# Patient Record
Sex: Female | Born: 1935
Health system: Southern US, Community
[De-identification: ages and names within clinical notes are randomized; demographics above are authoritative.]

## PROBLEM LIST (undated history)

## (undated) DIAGNOSIS — I779 Disorder of arteries and arterioles, unspecified: Secondary | ICD-10-CM

## (undated) DIAGNOSIS — T7840XA Allergy, unspecified, initial encounter: Secondary | ICD-10-CM

## (undated) DIAGNOSIS — N183 Chronic kidney disease, stage 3 unspecified: Secondary | ICD-10-CM

## (undated) DIAGNOSIS — I5189 Other ill-defined heart diseases: Secondary | ICD-10-CM

## (undated) DIAGNOSIS — M1990 Unspecified osteoarthritis, unspecified site: Secondary | ICD-10-CM

## (undated) DIAGNOSIS — E785 Hyperlipidemia, unspecified: Secondary | ICD-10-CM

## (undated) DIAGNOSIS — G4700 Insomnia, unspecified: Secondary | ICD-10-CM

## (undated) DIAGNOSIS — K219 Gastro-esophageal reflux disease without esophagitis: Secondary | ICD-10-CM

## (undated) DIAGNOSIS — I639 Cerebral infarction, unspecified: Secondary | ICD-10-CM

## (undated) DIAGNOSIS — I10 Essential (primary) hypertension: Secondary | ICD-10-CM

## (undated) DIAGNOSIS — I739 Peripheral vascular disease, unspecified: Secondary | ICD-10-CM

## (undated) HISTORY — DX: Insomnia, unspecified: G47.00

## (undated) HISTORY — DX: Cerebral infarction, unspecified: I63.9

## (undated) HISTORY — PX: HERNIA REPAIR: SHX51

## (undated) HISTORY — DX: Chronic kidney disease, stage 3 unspecified: N18.30

## (undated) HISTORY — PX: CHOLECYSTECTOMY: SHX55

## (undated) HISTORY — DX: Unspecified osteoarthritis, unspecified site: M19.90

## (undated) HISTORY — DX: Hyperlipidemia, unspecified: E78.5

## (undated) HISTORY — PX: BACK SURGERY: SHX140

## (undated) HISTORY — PX: CAROTID ENDARTERECTOMY: SUR193

## (undated) HISTORY — DX: Essential (primary) hypertension: I10

## (undated) HISTORY — PX: TONSILLECTOMY: SUR1361

## (undated) HISTORY — DX: Allergy, unspecified, initial encounter: T78.40XA

## (undated) HISTORY — DX: Gastro-esophageal reflux disease without esophagitis: K21.9

## (undated) HISTORY — PX: APPENDECTOMY: SHX54

## (undated) HISTORY — PX: HIP SURGERY: SHX245

## (undated) HISTORY — PX: FOOT SURGERY: SHX648

## (undated) HISTORY — PX: EYE SURGERY: SHX253

## (undated) HISTORY — DX: Chronic kidney disease, stage 3 (moderate): N18.3

---

## 2007-03-19 ENCOUNTER — Other Ambulatory Visit: Payer: Self-pay

## 2007-03-20 ENCOUNTER — Inpatient Hospital Stay: Payer: Self-pay | Admitting: Internal Medicine

## 2007-03-26 ENCOUNTER — Other Ambulatory Visit: Payer: Self-pay

## 2007-03-26 ENCOUNTER — Inpatient Hospital Stay: Payer: Self-pay | Admitting: Internal Medicine

## 2007-06-03 ENCOUNTER — Ambulatory Visit: Payer: Self-pay | Admitting: Gastroenterology

## 2007-12-20 ENCOUNTER — Observation Stay: Payer: Self-pay | Admitting: Internal Medicine

## 2007-12-20 ENCOUNTER — Other Ambulatory Visit: Payer: Self-pay

## 2008-05-17 ENCOUNTER — Inpatient Hospital Stay: Payer: Self-pay | Admitting: Vascular Surgery

## 2008-05-17 ENCOUNTER — Other Ambulatory Visit: Payer: Self-pay

## 2010-01-12 ENCOUNTER — Ambulatory Visit: Payer: Self-pay | Admitting: Internal Medicine

## 2010-09-12 ENCOUNTER — Ambulatory Visit: Payer: Self-pay | Admitting: Internal Medicine

## 2013-05-20 ENCOUNTER — Inpatient Hospital Stay: Payer: Self-pay | Admitting: Internal Medicine

## 2013-05-20 LAB — CBC
HCT: 30.6 % — ABNORMAL LOW (ref 35.0–47.0)
HGB: 11 g/dL — ABNORMAL LOW (ref 12.0–16.0)
MCH: 32.1 pg (ref 26.0–34.0)
MCHC: 35.9 g/dL (ref 32.0–36.0)
MCV: 89 fL (ref 80–100)
WBC: 9.8 10*3/uL (ref 3.6–11.0)

## 2013-05-20 LAB — COMPREHENSIVE METABOLIC PANEL
Albumin: 4.1 g/dL (ref 3.4–5.0)
Alkaline Phosphatase: 97 U/L (ref 50–136)
Co2: 26 mmol/L (ref 21–32)
EGFR (Non-African Amer.): 39 — ABNORMAL LOW
Glucose: 127 mg/dL — ABNORMAL HIGH (ref 65–99)
Osmolality: 245 (ref 275–301)
Potassium: 5 mmol/L (ref 3.5–5.1)
Sodium: 119 mmol/L — CL (ref 136–145)
Total Protein: 7.5 g/dL (ref 6.4–8.2)

## 2013-05-20 LAB — URINALYSIS, COMPLETE
Bacteria: NONE SEEN
Bilirubin,UR: NEGATIVE
Blood: NEGATIVE
Glucose,UR: NEGATIVE mg/dL (ref 0–75)
Ketone: NEGATIVE
Leukocyte Esterase: NEGATIVE
Nitrite: NEGATIVE
RBC,UR: 1 /HPF (ref 0–5)
Specific Gravity: 1.004 (ref 1.003–1.030)
Squamous Epithelial: <1
WBC UR: 1 /HPF (ref 0–5)

## 2013-05-21 LAB — BASIC METABOLIC PANEL
Anion Gap: 6 — ABNORMAL LOW (ref 7–16)
Calcium, Total: 8.6 mg/dL (ref 8.5–10.1)
Chloride: 99 mmol/L (ref 98–107)
Creatinine: 1.13 mg/dL (ref 0.60–1.30)
EGFR (African American): 52 — ABNORMAL LOW
Glucose: 91 mg/dL (ref 65–99)
Osmolality: 260 (ref 275–301)
Potassium: 4.3 mmol/L (ref 3.5–5.1)
Sodium: 128 mmol/L — ABNORMAL LOW (ref 136–145)

## 2013-05-21 LAB — TSH: Thyroid Stimulating Horm: 3.66 u[IU]/mL

## 2013-05-22 LAB — BASIC METABOLIC PANEL
Anion Gap: 6 — ABNORMAL LOW (ref 7–16)
Co2: 22 mmol/L (ref 21–32)
Creatinine: 1.19 mg/dL (ref 0.60–1.30)
EGFR (Non-African Amer.): 42 — ABNORMAL LOW
Osmolality: 271 (ref 275–301)
Potassium: 4.1 mmol/L (ref 3.5–5.1)
Sodium: 134 mmol/L — ABNORMAL LOW (ref 136–145)

## 2013-05-23 LAB — BASIC METABOLIC PANEL
BUN: 17 mg/dL (ref 7–18)
Co2: 23 mmol/L (ref 21–32)
Creatinine: 1.03 mg/dL (ref 0.60–1.30)
Osmolality: 275 (ref 275–301)
Potassium: 3.8 mmol/L (ref 3.5–5.1)
Sodium: 137 mmol/L (ref 136–145)

## 2013-06-19 LAB — COMPREHENSIVE METABOLIC PANEL
Albumin: 3.3 g/dL — ABNORMAL LOW (ref 3.4–5.0)
Anion Gap: 6 — ABNORMAL LOW (ref 7–16)
Chloride: 103 mmol/L (ref 98–107)
Co2: 25 mmol/L (ref 21–32)
Creatinine: 1.71 mg/dL — ABNORMAL HIGH (ref 0.60–1.30)
EGFR (African American): 31 — ABNORMAL LOW
Glucose: 124 mg/dL — ABNORMAL HIGH (ref 65–99)
Osmolality: 280 (ref 275–301)
Potassium: 4.3 mmol/L (ref 3.5–5.1)
Total Protein: 6.1 g/dL — ABNORMAL LOW (ref 6.4–8.2)

## 2013-06-19 LAB — URINALYSIS, COMPLETE
Bilirubin,UR: NEGATIVE
Glucose,UR: NEGATIVE mg/dL (ref 0–75)
Ketone: NEGATIVE
Ph: 6 (ref 4.5–8.0)
Protein: 30
Specific Gravity: 1.013 (ref 1.003–1.030)
WBC UR: 70 /HPF (ref 0–5)

## 2013-06-19 LAB — TROPONIN I: Troponin-I: <0.02 ng/mL

## 2013-06-19 LAB — APTT: Activated PTT: 39.4 secs — ABNORMAL HIGH (ref 23.6–35.9)

## 2013-06-19 LAB — CBC
HCT: 28 % — ABNORMAL LOW (ref 35.0–47.0)
HGB: 10 g/dL — ABNORMAL LOW (ref 12.0–16.0)
MCH: 32.8 pg (ref 26.0–34.0)
MCHC: 35.8 g/dL (ref 32.0–36.0)
WBC: 10.3 10*3/uL (ref 3.6–11.0)

## 2013-06-19 LAB — PROTIME-INR: Prothrombin Time: 13.5 secs (ref 11.5–14.7)

## 2013-06-19 LAB — CALCIUM: Calcium, Total: 8.7 mg/dL (ref 8.5–10.1)

## 2013-06-20 ENCOUNTER — Inpatient Hospital Stay: Payer: Self-pay | Admitting: Internal Medicine

## 2013-06-20 LAB — BASIC METABOLIC PANEL
Anion Gap: 10 (ref 7–16)
Co2: 23 mmol/L (ref 21–32)
Creatinine: 1.66 mg/dL — ABNORMAL HIGH (ref 0.60–1.30)
EGFR (African American): 32 — ABNORMAL LOW
Glucose: 157 mg/dL — ABNORMAL HIGH (ref 65–99)
Osmolality: 287 (ref 275–301)
Potassium: 4.5 mmol/L (ref 3.5–5.1)
Sodium: 137 mmol/L (ref 136–145)

## 2013-06-20 LAB — TROPONIN I
Troponin-I: <0.02 ng/mL
Troponin-I: <0.02 ng/mL

## 2013-06-20 LAB — CK TOTAL AND CKMB (NOT AT ARMC)
CK, Total: 31 U/L (ref 21–215)
CK, Total: 41 U/L (ref 21–215)
CK-MB: <0.5 ng/mL — ABNORMAL LOW (ref 0.5–3.6)
CK-MB: 1 ng/mL (ref 0.5–3.6)

## 2013-06-21 LAB — CBC WITH DIFFERENTIAL/PLATELET
Basophil #: 0.1 10*3/uL (ref 0.0–0.1)
Basophil %: 0.9 %
Eosinophil %: 3.2 %
HGB: 11.2 g/dL — ABNORMAL LOW (ref 12.0–16.0)
Lymphocyte %: 50.6 %
MCH: 32.8 pg (ref 26.0–34.0)
MCHC: 34.9 g/dL (ref 32.0–36.0)
Monocyte #: 0.5 x10 3/mm (ref 0.2–0.9)
Monocyte %: 5.6 %
Neutrophil #: 3.5 10*3/uL (ref 1.4–6.5)
Platelet: 235 10*3/uL (ref 150–440)
RBC: 3.41 10*6/uL — ABNORMAL LOW (ref 3.80–5.20)
WBC: 8.7 10*3/uL (ref 3.6–11.0)

## 2013-06-21 LAB — BASIC METABOLIC PANEL
BUN: 33 mg/dL — ABNORMAL HIGH (ref 7–18)
Calcium, Total: 8.8 mg/dL (ref 8.5–10.1)
Creatinine: 1.26 mg/dL (ref 0.60–1.30)
EGFR (African American): 45 — ABNORMAL LOW
Glucose: 94 mg/dL (ref 65–99)
Osmolality: 281 (ref 275–301)
Potassium: 4.5 mmol/L (ref 3.5–5.1)
Sodium: 137 mmol/L (ref 136–145)

## 2013-06-21 LAB — URINE CULTURE

## 2013-10-28 ENCOUNTER — Ambulatory Visit: Payer: Self-pay | Admitting: Internal Medicine

## 2014-04-05 ENCOUNTER — Inpatient Hospital Stay: Payer: Self-pay | Admitting: Internal Medicine

## 2014-04-05 LAB — CBC WITH DIFFERENTIAL/PLATELET
Basophil #: 0.1 10*3/uL (ref 0.0–0.1)
Basophil %: 0.6 %
EOS PCT: 1.5 %
Eosinophil #: 0.2 10*3/uL (ref 0.0–0.7)
HCT: 35 % (ref 35.0–47.0)
HGB: 11.5 g/dL — ABNORMAL LOW (ref 12.0–16.0)
LYMPHS ABS: 1.4 10*3/uL (ref 1.0–3.6)
LYMPHS PCT: 13.1 %
MCH: 33.2 pg (ref 26.0–34.0)
MCHC: 32.9 g/dL (ref 32.0–36.0)
MCV: 101 fL — AB (ref 80–100)
Monocyte #: 0.5 x10 3/mm (ref 0.2–0.9)
Monocyte %: 5 %
Neutrophil #: 8.6 10*3/uL — ABNORMAL HIGH (ref 1.4–6.5)
Neutrophil %: 79.8 %
Platelet: 229 10*3/uL (ref 150–440)
RBC: 3.47 10*6/uL — ABNORMAL LOW (ref 3.80–5.20)
RDW: 14.4 % (ref 11.5–14.5)
WBC: 10.7 10*3/uL (ref 3.6–11.0)

## 2014-04-05 LAB — HEPATIC FUNCTION PANEL A (ARMC)
ALT: 23 U/L
AST: 31 U/L (ref 15–37)
Albumin: 3.5 g/dL (ref 3.4–5.0)
Alkaline Phosphatase: 120 U/L — ABNORMAL HIGH
Bilirubin,Total: 0.3 mg/dL (ref 0.2–1.0)
Total Protein: 7.3 g/dL (ref 6.4–8.2)

## 2014-04-05 LAB — URINALYSIS, COMPLETE
BACTERIA: NONE SEEN
Bilirubin,UR: NEGATIVE
Glucose,UR: NEGATIVE mg/dL (ref 0–75)
Ketone: NEGATIVE
Nitrite: POSITIVE
Ph: 5 (ref 4.5–8.0)
RBC,UR: <1 /HPF (ref 0–5)
Specific Gravity: 1.011 (ref 1.003–1.030)
WBC UR: 10 /HPF (ref 0–5)

## 2014-04-05 LAB — BASIC METABOLIC PANEL
Anion Gap: 10 (ref 7–16)
BUN: 38 mg/dL — AB (ref 7–18)
CO2: 23 mmol/L (ref 21–32)
Calcium, Total: 9.1 mg/dL (ref 8.5–10.1)
Chloride: 108 mmol/L — ABNORMAL HIGH (ref 98–107)
Creatinine: 1.59 mg/dL — ABNORMAL HIGH (ref 0.60–1.30)
EGFR (African American): 34 — ABNORMAL LOW
EGFR (Non-African Amer.): 29 — ABNORMAL LOW
Glucose: 168 mg/dL — ABNORMAL HIGH (ref 65–99)
Osmolality: 294 (ref 275–301)
POTASSIUM: 4.4 mmol/L (ref 3.5–5.1)
Sodium: 141 mmol/L (ref 136–145)

## 2014-04-05 LAB — TROPONIN I: Troponin-I: <0.02 ng/mL

## 2014-04-05 LAB — MAGNESIUM: Magnesium: 2 mg/dL

## 2014-04-06 LAB — CBC WITH DIFFERENTIAL/PLATELET
Basophil #: 0 10*3/uL (ref 0.0–0.1)
Basophil %: 0.3 %
EOS ABS: 0.1 10*3/uL (ref 0.0–0.7)
EOS PCT: 0.6 %
HCT: 30 % — AB (ref 35.0–47.0)
HGB: 9.7 g/dL — AB (ref 12.0–16.0)
Lymphocyte #: 3.3 10*3/uL (ref 1.0–3.6)
Lymphocyte %: 25.4 %
MCH: 32.4 pg (ref 26.0–34.0)
MCHC: 32.3 g/dL (ref 32.0–36.0)
MCV: 100 fL (ref 80–100)
Monocyte #: 0.8 x10 3/mm (ref 0.2–0.9)
Monocyte %: 6.1 %
NEUTROS ABS: 8.7 10*3/uL — AB (ref 1.4–6.5)
Neutrophil %: 67.6 %
Platelet: 219 10*3/uL (ref 150–440)
RBC: 2.99 10*6/uL — AB (ref 3.80–5.20)
RDW: 14.4 % (ref 11.5–14.5)
WBC: 12.8 10*3/uL — AB (ref 3.6–11.0)

## 2014-04-06 LAB — BASIC METABOLIC PANEL
Anion Gap: 10 (ref 7–16)
BUN: 32 mg/dL — AB (ref 7–18)
CO2: 24 mmol/L (ref 21–32)
CREATININE: 1.42 mg/dL — AB (ref 0.60–1.30)
Calcium, Total: 8.3 mg/dL — ABNORMAL LOW (ref 8.5–10.1)
Chloride: 108 mmol/L — ABNORMAL HIGH (ref 98–107)
EGFR (African American): 39 — ABNORMAL LOW
EGFR (Non-African Amer.): 34 — ABNORMAL LOW
Glucose: 94 mg/dL (ref 65–99)
Osmolality: 290 (ref 275–301)
POTASSIUM: 4.2 mmol/L (ref 3.5–5.1)
SODIUM: 142 mmol/L (ref 136–145)

## 2014-04-06 LAB — LIPID PANEL
CHOLESTEROL: 164 mg/dL (ref 0–200)
HDL: 40 mg/dL (ref 40–60)
Ldl Cholesterol, Calc: 102 mg/dL — ABNORMAL HIGH (ref 0–100)
TRIGLYCERIDES: 108 mg/dL (ref 0–200)
VLDL Cholesterol, Calc: 22 mg/dL (ref 5–40)

## 2014-04-06 LAB — HEMOGLOBIN A1C: Hemoglobin A1C: 6.7 % — ABNORMAL HIGH (ref 4.2–6.3)

## 2014-04-07 LAB — BASIC METABOLIC PANEL
ANION GAP: 9 (ref 7–16)
BUN: 26 mg/dL — AB (ref 7–18)
Calcium, Total: 8.8 mg/dL (ref 8.5–10.1)
Chloride: 107 mmol/L (ref 98–107)
Co2: 26 mmol/L (ref 21–32)
Creatinine: 1.43 mg/dL — ABNORMAL HIGH (ref 0.60–1.30)
EGFR (African American): 39 — ABNORMAL LOW
EGFR (Non-African Amer.): 33 — ABNORMAL LOW
Glucose: 87 mg/dL (ref 65–99)
Osmolality: 287 (ref 275–301)
Potassium: 4 mmol/L (ref 3.5–5.1)
Sodium: 142 mmol/L (ref 136–145)

## 2014-04-07 LAB — CBC WITH DIFFERENTIAL/PLATELET
BASOS ABS: 0.1 10*3/uL (ref 0.0–0.1)
Basophil %: 0.5 %
Eosinophil #: 0.3 10*3/uL (ref 0.0–0.7)
Eosinophil %: 2.8 %
HCT: 30.7 % — ABNORMAL LOW (ref 35.0–47.0)
HGB: 10 g/dL — ABNORMAL LOW (ref 12.0–16.0)
Lymphocyte #: 4.4 10*3/uL — ABNORMAL HIGH (ref 1.0–3.6)
Lymphocyte %: 37.5 %
MCH: 32.5 pg (ref 26.0–34.0)
MCHC: 32.5 g/dL (ref 32.0–36.0)
MCV: 100 fL (ref 80–100)
Monocyte #: 0.7 x10 3/mm (ref 0.2–0.9)
Monocyte %: 5.9 %
NEUTROS PCT: 53.3 %
Neutrophil #: 6.2 10*3/uL (ref 1.4–6.5)
Platelet: 214 10*3/uL (ref 150–440)
RBC: 3.07 10*6/uL — AB (ref 3.80–5.20)
RDW: 14.6 % — AB (ref 11.5–14.5)
WBC: 11.6 10*3/uL — AB (ref 3.6–11.0)

## 2014-10-13 DIAGNOSIS — I10 Essential (primary) hypertension: Secondary | ICD-10-CM | POA: Diagnosis not present

## 2014-10-13 DIAGNOSIS — M79673 Pain in unspecified foot: Secondary | ICD-10-CM | POA: Diagnosis not present

## 2014-10-13 DIAGNOSIS — I679 Cerebrovascular disease, unspecified: Secondary | ICD-10-CM | POA: Diagnosis not present

## 2014-10-13 DIAGNOSIS — N183 Chronic kidney disease, stage 3 (moderate): Secondary | ICD-10-CM | POA: Diagnosis not present

## 2014-10-13 DIAGNOSIS — E782 Mixed hyperlipidemia: Secondary | ICD-10-CM | POA: Diagnosis not present

## 2014-10-13 DIAGNOSIS — J0191 Acute recurrent sinusitis, unspecified: Secondary | ICD-10-CM | POA: Diagnosis not present

## 2014-11-03 DIAGNOSIS — I10 Essential (primary) hypertension: Secondary | ICD-10-CM | POA: Diagnosis not present

## 2014-11-03 DIAGNOSIS — I2510 Atherosclerotic heart disease of native coronary artery without angina pectoris: Secondary | ICD-10-CM | POA: Diagnosis not present

## 2014-11-03 DIAGNOSIS — E785 Hyperlipidemia, unspecified: Secondary | ICD-10-CM | POA: Diagnosis not present

## 2014-11-03 DIAGNOSIS — I6529 Occlusion and stenosis of unspecified carotid artery: Secondary | ICD-10-CM | POA: Diagnosis not present

## 2014-12-04 NOTE — H&P (Signed)
PATIENT NAME:  [name], HAWKIN MR#:  [id number] DATE OF BIRTH:  Jul 27, 1927  DATE OF ADMISSION:  05/20/2013  PRIMARY CARE PHYSICIAN: Lavera Guise, MD  REFERRING EMERGENCY ROOM PHYSICIAN: Algis Liming. Jimmye Norman, MD  CHIEF COMPLAINT: Weakness and dizziness.   HISTORY OF PRESENTING ILLNESS: An 81 year old female who is living alone, walks with a walker and almost independent in her day-to-day activities. Is taking Lasix for her leg edema and blood pressure. For the last 1 to 2 weeks, she started feeling a little weak, and last week, she started getting more dizzy, so her daughter, who lives nearby, was visiting her more frequently, and she also noticed, over the weekend, the last 2 to 3 days, that the patient is sometimes confused, and she had slurred speech and she is disoriented. So, she got worried about her, and finally they decided to call her primary care physician's office today. They called the office and described the symptoms and advised to come to the Emergency Room for these symptoms. On further questioning, the patient denies any new changes in the medications. She says that she is compliant with the medication. Denies any fever, focal weakness, any tremors or seizurelike activity. Denies any syncopal episodes, palpitations. In the ER, her sodium was found 119, and so hospitalist service is being contacted for further management of hyponatremia.   REVIEW OF SYSTEMS:  CONSTITUTIONAL: Negative for fever. Positive for fatigue and generalized weakness, but no pain or weight loss.  EYES: No blurring, double vision or discharge.  EARS, NOSE, THROAT: No tinnitus, ear pain or hearing loss.  RESPIRATORY: No cough, wheezing, hemoptysis or shortness of breath.  CARDIOVASCULAR: No chest pain, orthopnea, edema or palpitations.  GASTROINTESTINAL: No nausea, vomiting, diarrhea or abdominal pain.  GENITOURINARY: No dysuria, hematuria or increased frequency of the urination.  ENDOCRINE: No increased  sweating. No heat or cold intolerance.  SKIN: No acne, rashes or any lesions on the skin.  MUSCULOSKELETAL: No pain or swelling in the joints except her left lower limb, which is chronically swollen since she had some surgeries in the past and injuries many years ago.  NEUROLOGICAL: Denies any numbness, weakness, tremors or vertigo, but she has been feeling dizzy and had some speech problem in the last few days.  PSYCHIATRIC: No anxiety, insomnia or bipolar disorder.   PAST MEDICAL HISTORY:  1. Recurrent hyponatremia in the past.  2. Syncopal episode.  3. Hypertension.  4. Gastroesophageal reflux disease.  5. Insomnia.  6. Arthritis.   PAST SURGICAL HISTORY:  1. Hip replacement 2 times on the left.  2. Back surgery x3.  3. Left foot surgery with placement of bone.  4. Right foot surgery with placement of wires.  5. Colonoscopy. 6. Cholecystectomy.  7. Appendectomy.  8. Hernia surgery.  9. Tonsillectomy.   SOCIAL HISTORY: She lives alone. Walks with a walker. Denies smoking, alcohol or illegal drug use. She has a daughter who lives in [place] and takes care of her grocery and day-to-day needs.   FAMILY HISTORY: Positive for heart disease.   HOME MEDICATION:  1. Amlodipine/benazepril 10/20 mg capsule once a day.  2. An 81 mg aspirin once a day.  3. Centrum vitamin 1 tablet once a day.  4. Clonidine 0.2 mg 24-hour transdermal patch once a week.  5. Furosemide 20 mg oral tablet once a day.  6. Hydrocortisone, neomycin and polymyxin B combination solution 2 drops to left ear 3 times a day.  7. Omeprazole 20 mg delayed-release capsule once a  day for belching and stomach.  8. Uloric 40 mg oral tablet once a day.  9. Vitamin D3 1000 international units once a day.  10. Zolpidem 5 mg oral tablet once a day.   PHYSICAL EXAMINATION:  VITAL SIGNS: In the ER, temperature 97.6, pulse rate 73, respirations 18, blood pressure 150/66 and pulse oximetry 97 on room air.  GENERAL: The patient  is fully alert and oriented to time, place and person. She is appropriate for her age.  HEENT: Head and neck atraumatic. Conjunctivae pink. Oral mucosa moist.  NECK: Supple. No JVD.  RESPIRATORY: Bilateral clear and equal air entry.  CARDIOVASCULAR: S1, S2 present, regular. No murmur.  ABDOMEN: Soft, nontender. Bowel sounds present. No organomegaly.  SKIN: No rashes.  LEGS: No edema, except her chronic swelling on the left leg which is due to surgeries many years ago, and the leg is always swollen like that.  JOINTS: No swelling or tenderness.  NEUROLOGICAL: Power 4 out of 5. Generalized weakness. Moves all 4 limbs. No tremors or rigidity.  PSYCHIATRIC: Does not appear in any acute psychiatric illness.   IMPORTANT LABORATORY RESULTS: Glucose 127, BUN 22, creatinine 1.27, sodium 119, potassium 5.0, chloride 88, CO2 26, protein 7.5, albumin 4.1, bilirubin 0.6, SGOT 29, SGPT 22. WBC 9.8, hemoglobin 11, platelet count 325, MCV 89. Urinalysis is negative.   ASSESSMENT AND PLAN: An 81 year old female who is very independent in day-to-day activities, taking Lasix every day for her chronic leg swelling and hypertension, was feeling dizzy and speech problems for the last few days and came to the Emergency Room. Found having hyponatremia of 119.  1. Hyponatremia. Sodium is 119. Will give her IV fluid, normal saline, and monitor her sodium level. Will hold her Lasix for now. Will also check TSH level.  2. Hypertension. Will continue home medication, benazepril and amlodipine, for now and hold Lasix. Blood pressure is under control.  3. Weakness and dizziness. This is due to hyponatremia, but will get physical therapy evaluation because of her old age.  4. Anemia. Will give iron supplementation orally.  5. Gastroesophageal reflux disease. Will give omeprazole.  6. Insomnia. Will continue zolpidem which she was taking at home.   CODE STATUS: Do not resuscitate. Healthcare power of attorney is her daughter,  who is present in the room.   TOTAL TIME SPENT ON THIS ADMISSION: 50 minutes.   ____________________________ Ceasar Lund Anselm Jungling, MD vgv:OSi D: 05/20/2013 12:26:17 ET T: 05/20/2013 12:36:30 ET JOB#: [id number]  cc: Ceasar Lund. Anselm Jungling, MD, <Dictator> Lavera Guise, MD Rosalio Macadamia [facility] MD ELECTRONICALLY SIGNED 05/26/2013 23:05

## 2014-12-04 NOTE — Consult Note (Signed)
Brief Consult Note: Diagnosis: Pt with history of hyeprtension who was noted to become unresponsive while eating.   Patient was seen by consultant.   Recommend further assessment or treatment.   Comments: 81 yo female who became unresponsive while eating. She has been on a variety of antihypertensives including clonidine and beta blockers. She was noted to be hypotensive and bradycardic on arrival to eer and by ems. Pts family states they were unable to feel a pulse but said she had shallow breathing. They were instructed to do cpr by 911. Pt currently is hemodynamically stable. Herat rate in the upper 50s to 60's. Beta blockers and clonidine held. Will follwo rhythm and symtpoms .  Electronic Signatures: Teodoro Spray (MD)  (Signed [phone] 07:11)  Authored: Brief Consult Note   Last Updated: 08-Nov-14 07:11 by Teodoro Spray (MD)

## 2014-12-04 NOTE — H&P (Signed)
PATIENT NAME:  [name], [name] MR#:  [id number] DATE OF BIRTH:  1927/07/10  DATE OF ADMISSION:  06/20/2013  REFERRING PHYSICIAN: Dr. Hinda Kehr.   PRIMARY CARE PHYSICIAN: Dr. Clayborn Bigness.   CARDIOLOGY: Currently seeing none.   CHIEF COMPLAINT: Syncope.   HISTORY OF PRESENT ILLNESS: This is an 81 year old female who presents with syncope. The patient was sitting at home on a chair. Family were at bedside, her daughter and her son-in-law. The patient reports she felt some lightheadedness and dizzy and felt foggy. Then, she had episode of loss of consciousness. Family reports she always was breathing, but they reported that when they called 911, they were instructed to do some chest compressions. They did some chest compressions for her. When paramedics presented, they found her to be bradycardic. Upon presentation to ED, the patient's symptoms have resolved and she was back to her baseline. In the ED, the patient was noticed to have bradycardia with junctional rhythm at 49 beats per minute. The family reports the patient was recently started on a new medication for her blood pressure. Report she has a few changes. They report she was on diuretic which was stopped before 1-1/2 weeks. She was on Norvasc and benazepril which was stopped then, and the patient was transitioned to a few medications, where she was started on hydralazine before 3 weeks. A couple of days ago, she was resumed back on benazepril and yesterday, she was started on Coreg 6.25 mg. She took 2 doses, one in the morning and one in the evening yesterday prior to her syncopal episode. As well, they report she has been on clonidine patch 0.2 mg which was stopped yesterday as well. As well, the patient had blood work done which did show her to be in acute renal failure with baseline creatinine of 1, where it was 1.71 today. As well, she had a positive urinalysis. Currently, the patient is back to her baseline. Denies any shortness of breath,  any confusion, any altered mental status, any nausea, vomiting, diarrhea, fever, constipation. Hospitalist service was requested to admit the patient for further management and treatment for her syncope and bradycardia and UTI.    PAST MEDICAL HISTORY:  1. Recurrent hyponatremia in the past.  2. Syncopal episodes.  3. Hypertension.  4. Gastroesophageal reflux disease.  5. Insomnia.  6. Arthritis.   PAST SURGICAL HISTORY:  1. Hip replacement x 2 to the left.  2. Back surgery x 3.  3. Left foot surgery with placement of bone.  4. Right foot surgery with placement of wires.  5. Colonoscopy.  6. Cholecystectomy.  7. Appendectomy.  8. Hernia surgery.  9. Tonsillectomy.   SOCIAL HISTORY: She lives alone. Walks with a walker. Denies smoking, alcohol or illegal use. She has a daughter who lives in [place] and takes care of her groceries and day-to-day needs.   FAMILY HISTORY: Positive for heart disease.   REVIEW OF SYSTEMS:  CONSTITUTIONAL: The patient denies any fever, chills, weakness or fatigue.  EYES: Denies blurry vision, double vision, inflammation, glaucoma, tinnitus, ear pain, epistaxis or discharge.  RESPIRATORY: Denies cough, wheezing, hemoptysis, dyspnea, COPD.   CARDIOVASCULAR: Denies chest pain, edema, arrhythmia, palpitation. Reports episode of syncope.  GASTROINTESTINAL: Denies nausea, vomiting, diarrhea, abdominal pain, hematemesis, melena.  GENITOURINARY: Denies dysuria, hematuria or renal colic.  ENDOCRINE: Denies polyuria, polydipsia, heat or cold intolerance.  HEMATOLOGY: Denies anemia, easy bruising, bleeding diathesis.  INTEGUMENT: Denies acne, rash or skin lesions.  MUSCULOSKELETAL: Denies any neck pain, shoulder pain,  cramps, swelling or gout.  NEUROLOGIC: Denies CVA, TIA, headache, ataxia, vertigo, tremors. Reports some dizziness and feeling foggy prior to the syncope.  PSYCHIATRIC: Denies anxiety, insomnia, depression.   PHYSICAL EXAMINATION:  VITAL SIGNS:  Temperature 97.7, pulse 47, respiratory rate 18, blood pressure 145/72, saturating 98% on room air. Currently, her heart rate is 62 on telemetry monitor.  GENERAL: Elderly female, looks comfortable in bed, in no apparent distress.  HEENT: Head atraumatic, normocephalic. Pupils equal, reactive to light. Pink conjunctivae. Anicteric sclerae. Moist oral mucosa.  NECK: Supple. No thyromegaly. No JVD.  CHEST: Good air entry bilaterally. No wheezing, rales, rhonchi.  CARDIOVASCULAR: S1, S2 heard. No rubs, murmurs or gallops. Regular rate and rhythm.  ABDOMEN: Soft, nontender, nondistended. Bowel sounds present.  EXTREMITIES: No edema. No clubbing. No cyanosis. Pedal pulses felt bilaterally.  PSYCHIATRIC: Appropriate affect. Awake, alert x 3. Intact judgment and insight.  NEUROLOGIC: Cranial nerves grossly intact. Motor 5 out of 5. No focal deficits.  LYMPHATICS: No cervical or supraclavicular lymphadenopathy.  SKIN: Warm and dry. No rash.   PERTINENT LABORATORIES: Glucose 124, BUN 42, creatinine 1.71, sodium 134, potassium 4.3, chloride 103, CO2 25. ALT 18, AST 22, alk phos 84. Troponin less than 0.02. White blood cells 10.3, hemoglobin 10, hematocrit 28, platelets 238. Urinalysis showing +3 leukocyte esterase and 70 white blood cells.   EKG showing junctional rhythm at 49 beats per minute.   Magnesium 1.7.   IMAGING: Chest x-ray showing no radiographic evidence of acute cardiopulmonary disease and atherosclerosis.   ASSESSMENT AND PLAN:  1. Syncope: Appears to be multifactorial, but this is mainly due to bradycardia, most likely from being started on Coreg. As well, dehydration and urinary tract infection contributing to it. Hypomagnesemia was replaced. The patient will be admitted to telemetry unit. Will check orthostatics on the patient. Will continue with hydration. Will be monitored on telemetry.  2. Bradycardia: This is most likely due to starting again on beta blockers. Currently, heart rate  much improved. Last heart rate was in the 70s on the telemetry monitor. The patient will be admitted to telemetry unit. Will be kept on the monitor and will have transcutaneous pads left and will have pacemaker at bedside. At this point, there is no indication for atropine or for glucagon to reverse the beta blockers as heart rate is good and the patient is asymptomatic. Will obtain a 2-D echocardiogram and will consult the cardiology service.  3. Urinary tract infection: the patient will be started on Rocephin.  4. Acute renal failure: Will hold benazepril. Will start her on intravenous fluids.  5. Hypertension: Will hold all medications currently and if needed, she will be started gradually on hydralazine.   6. Anemia: Continue with iron supplement.  7. Deep vein thrombosis prophylaxis: Subcutaneous heparin.  8. Gastrointestinal prophylaxis: On Protonix.   CODE STATUS: Discussed with the family. The patient has a Living Will. Her daughter is her healthcare power of attorney. The patient is a FULL CODE but does not want her life to be sustained if she requires life support for a long period of time.   TOTAL TIME SPENT ON ADMISSION AND PATIENT CARE: 60 minutes.   ____________________________ Albertine Patricia, MD dse:gb D: 06/20/2013 00:58:42 ET T: 06/20/2013 01:31:27 ET JOB#: [id number]  cc: Albertine Patricia, MD, <Dictator> Notnamed Croucher Graciela Husbands MD ELECTRONICALLY SIGNED 06/20/2013 4:09

## 2014-12-04 NOTE — Discharge Summary (Signed)
PATIENT NAME:  [name], [name] MR#:  [id number] DATE OF BIRTH:  October 02, 1926  DATE OF ADMISSION:  05/20/2013 DATE OF DISCHARGE:  05/23/2013  DISCHARGE DIAGNOSES:  1. Hyponatremia due to diuretic use.  2. Weakness due to hyponatremia.  3. Hypertension.  4. Anemia.  5. Insomnia.   CONDITION ON DISCHARGE: Stable.   CODE STATUS: DO NOT RESUSCITATE.   MEDICATIONS ON DISCHARGE:  1. Amlodipine/benazepril 10 mg/20 mg oral once a day.  2. Aspirin 81 mg once a day.  3. Omeprazole 20 mg once a day.  4. Zolpidem 5 mg once a day.  5. Vitamin D3 1000 international units once a day.  6. Uloric 40 mg oral tablet once a day.  7. Centrum vitamin once a day.  8. Ferrous sulfate 325 mg oral tablet 2 times a day with meals.   HOME HEALTH ON DISCHARGE: Yes. Advised to have physical therapy and nurse aid.   DIET: Low sodium, regular consistency.   ACTIVITY: As tolerated.   TIMEFRAME TO FOLLOWUP: Within 1 to 2 weeks advised to have sodium level. Follow up in 2 to 3 weeks with primary care physician, Dr. Clayborn Bigness.   HISTORY OF PRESENT ILLNESS: This 81 year old female lives alone. Walks with a walker. Almost independent in day-to-day activities. Was taking Lasix for her leg edema and blood pressure. For the last 1 month, she started feeling weak which was getting worse, and she was getting more dizzy. Her daughter also noticed that the patient was appearing sometimes confused and slurred speech and was disoriented, so she decided to call the primary physician's office. They called the office and described the symptoms, and they advised her to go to the Emergency Room. In the ER, she was found to have a sodium level of 119, so she was admitted for further management of hyponatremia.   HOSPITAL COURSE AND STAY:  1. She was started on IV fluid normal saline, and her sodium level was gradually improving which was corrected up to 137 at the time of discharge. We held her Lasix. TSH level was normal. Advised not  to take Lasix after discharge.  2. Hypertension: We continued benazepril/amlodipine, but we held Lasix. Blood pressure was under control without Lasix.  3. Weakness and dizziness: That was due to hyponatremia. Physical therapy suggested home with assist, and we arranged for that.  4. Anemia: Iron supplementation started orally.  5. Gastroesophageal reflux disease: Gave omeprazole.  6. Insomnia: We continued zolpidem as she was taking at home.   IMPORTANT LABORATORY RESULTS IN THE HOSPITAL: WBC count 9.8, hemoglobin 11, platelet count 325. Glucose 127, creatinine 1.27, sodium was 119 on presentation. Urinalysis was neg. TSH was 3.66. Sodium was gradually increasing up to 137 on the day of discharge.   TOTAL TIME SPENT ON THIS DISCHARGE SUMMARY: 35 minutes.   ____________________________ Ceasar Lund Anselm Jungling, MD vgv:gb D: 05/26/2013 22:13:23 ET T: 05/27/2013 00:30:28 ET JOB#: [id number]  cc: Ceasar Lund. Anselm Jungling, MD, <Dictator> Lavera Guise, MD Rosalio Macadamia [facility] MD ELECTRONICALLY SIGNED 06/10/2013 0:16

## 2014-12-04 NOTE — Discharge Summary (Signed)
PATIENT NAME:  [name], [name] MR#:  [id number] DATE OF BIRTH:  1927-01-18  DATE OF ADMISSION:  06/20/2013 DATE OF DISCHARGE:  06/22/2013  ADMITTING DIAGNOSIS: Syncope as well as possible cardiac arrest.   DISCHARGE DIAGNOSES: 1.  Syncope felt to be due to bradycardia as well as hypotension.  2.  Bradycardia felt to be due to recent initiation of Coreg. Her bradycardia has improved. The patient's Coreg has been stopped.  3.  Labile blood pressure. Initially blood pressure was low then it was elevated. I recommend outpatient renal artery stenosis evaluation. This needs to be arranged by primary care provider.  4.  Acute on chronic renal failure, resolved with IV hydration. Possibly urinary tract infection, although cultures were negative. The patient was asymptomatic. She was treated with antibiotics for 3 days in the hospital. This should suffice in light of no symptoms and urine cultures being negative.  5.  History of recurrent hyponatremia.  6.  History of recurrent syncopal episodes.  7.  Gastroesophageal reflux disease. 8.  Insomnia.  9.  Arthritis.  10.  Status post hip replacement x 2 on the left. 11.  Status post back surgery.  12.  Status post left foot surgery with replacement of bone.  13.  Status post right foot surgery with placement of wires. 14.  Status post cholecystectomy.  15.  Status post appendectomy.  16.  Status post hernia repair.  17.  Status post tonsillectomy.   CONSULTANTS: Dr. Ubaldo Glassing.  PERTINENT LABS AND EVALUATIONS: Admitting glucose 157, BUN 40, creatinine 1.66, sodium 137, potassium 4.5, chloride 104, CO2 23. Hemoglobin was 9.8. Troponin was less than 0.5. Magnesium 1.7. WBC 10.3, hemoglobin 10, platelet count 283.   Urine cultures: Mixed bacterial organisms, leukocytes 3+, WBC 70. Chest x-ray showed no acute abnormality. Echocardiogram of the heart showed normal EF, mildly increased left ventricular septal thickness, mild aortic regurgitation. Carotid  Dopplers showed quantification of carotid stenosis based on velocity and parameter that correlate the residual internal carotid diameter with NSCT-based stenosis levels. The left carotid artery shows a 70% diameter stenosis, widely patent right internal carotid artery. Creatinine was 1.26 on October 8th. Echocardiogram of the heart showed EF of 70% to 75%, borderline concentric LVH, mildly increased left ventricular septal thickness.   HOSPITAL COURSE: Please refer to H and P done by the admitting physician. The patient is an 81 year old Caucasian female who has had previous history of syncope in the past and has had very labile blood pressure who was seen by her primary care provider recently and had her blood pressure medications changed. She apparently had Coreg added to her current regimen. The patient at home was found to have some dizziness initially and then had loss of consciousness. There was some attempt at resuscitation by her son-in-law and EMS was called. When the patient arrived to the ER, she was noted to have junctional rhythm with a heart rate in the 50s. The patient was thought to have bradycardia related to Coreg therapy. She was seen by cardiology who recommended to hold Coreg. Initially, her blood pressure was low but then it started increasing, in the 190s. Not clear if this is related to some anxiety component as well as she had to be restarted on her benazepril and her clonidine which she reported that she was on this for years. Cardiology plans to do outpatient Holter monitor. I recommended to the patient  strongly to check her blood pressure and keep a log and follow up with her primary  care provider. She also needs to likely have renal artery evaluation to make sure that she does not have renal artery stenosis with significant labile blood pressure. From an acute hospital standpoint, the patient is currently stable, but as stated she needs very close monitoring.   DISCHARGE  MEDICATIONS:  1.  Bayer aspirin 81 mg 1 tab p.o. daily. 2.  Omeprazole 20 mg daily. 3.  Ambien 5 mg daily. 4.  Vitamin D3 1000 international units daily. 5.  Uloric 40 mg daily. 6.  Centrovite 1 tab p.o. daily. 7.  Iron sulfate 325 mg 1 tab p.o. b.i.d.  8.  Benazepril 5 mg 1 tab p.o. b.i.d. 9.  Catapres 0.1 mg 1 tab p.o. b.i.d.   DIET: Low sodium.   ACTIVITY: As tolerated.   DISCHARGE FOLLOWUP: With primary MD in 1 to 2 weeks. Dr. Ubaldo Glassing to arrange outpatient Holter monitor. The patient's family to keep a log of blood pressures to take to primary MD.   TIME SPENT ON DISCHARGE: 32 minutes. ____________________________ Lafonda Mosses Posey Pronto, MD shp:sb D: 06/23/2013 08:43:05 ET T: 06/23/2013 10:18:56 ET JOB#: [id number]  cc: Kinda Pottle H. Posey Pronto, MD, <Dictator> Alric Seton MD ELECTRONICALLY SIGNED 06/27/2013 16:49

## 2014-12-05 NOTE — Discharge Summary (Signed)
PATIENT NAME:  [name], [name] MR#:  [id number] DATE OF BIRTH:  July 25, 1927  DATE OF ADMISSION:  04/05/2014 DATE OF DISCHARGE:  04/07/2014  ADMISSION DIAGNOSES: 1. Cerebrovascular accident. 2.  Carotid artery stenosis.   DISCHARGE DIAGNOSES: 1.  Cerebrovascular accident.  2.  Left carotid artery stenosis 75%.  3.  Diabetes. CONSULTATIONS: Algernon Huxley, MD  PERTINENT LABORATORIES:  1.  CT of the head showed possible small acute frontal/temporal lobe CVA.  2. CTA of the neck showed 75% to 80% carotid stenosis in the left carotid artery.  3.  Low extremity Doppler was positive for Baker cyst.  4.  Discharge sodium 142, potassium 4.0, chloride 107, bicarbonate 26, BUN 26, creatinine 1.43, glucose is 87. White blood cells 11, hemoglobin 10, hematocrit 31, platelets are 214,000.  5.  LDL 182. Cholesterol 164, triglycerides 108, HDL 40, VLDL 22.  6.  Hemoglobin A1c is 6.7.   HOSPITAL COURSE: This is an 81 year old female with history of carotid artery disease, who presented with syncope and was found to have a head CT positive for an acute stroke. For further details, please refer to H and P.   1.  Acute frontal/temporal CVA without neurological findings. I think this is an incidental finding as the patient's initial symptoms on presentation did not correlate with a stroke. In any case, she was changed from aspirin to Plavix. She had carotid Dopplers which showed a 70% stenosis of the left carotid, so she underwent a CT angiogram which showed about 75% to 80% stenosis. Vascular, Dr. Lucky Cowboy, had seen the patient in consultation. This is a patient of Dr. Bunnie Domino who follows as an outpatient every 6 months. At this time, since the patient's carotid artery stenosis is actually asymptomatic, he recommends outpatient followup. She will continue on Plavix and statin, which she was started on during this hospitalization.  2.  Carotid artery stenosis. Plan as outlined below. The patient will follow up with Dr. Lucky Cowboy  in 1-2 weeks.  3.  Leg pain. The patient had some leg pain, underwent a Doppler which was negative for DVT but did show a Baker cyst. At this time, no surgical intervention. She could also have some peripheral arterial disease and if indicated may have ABIs as an outpatient with Dr. Lucky Cowboy.  4.  Urinary tract infection. The patient was on Rocephin and will be discharged on Keflex at discharge.  5.  Stage 3 chronic kidney disease, which is stable.  6.  Hyperlipidemia. The patient was not on a statin. Her LDL is not at goal. She started a statin at this time and will need follow up LFTs as well as lipid panel in 6-8 weeks. 7.  Diabetes. Her A1c was 6.7. No history of diabetes. She is referred to outpatient diabetes education. No medications were started at this time and this can be referred to her primary care physician and lifestyle management should be initiated.   DISCHARGE MEDICATIONS:  1.  Omeprazole 20 mg daily.  2.  Vitamin D3 at 1000 international units daily.  3.  Multivitamin 1 tablet daily.  4.  Benazepril 5 mg b.i.d.  5.  Clonidine 0.1 mg patch every Friday.  6.  Ferrous sulfate 325 mg b.i.d.  7.  Allopurinol 100 mg 2 tablets at bedtime.  8.  Xanax 0.25 mg at bedtime.  9.  Norvasc. 2.5 daily.  10.  Pravastatin 20 mg daily.  11.  Plavix 75 mg daily. 12.  Keflex 500 mg p.o. t.i.d. x 5 days.  DISPOSITION: Discharged with home health with physical therapy nurse for assessment in gait.   DISCHARGE ACTIVITY: As tolerated.   DISCHARGE DIET: Low sodium.   DISCHARGE FOLLOWUP: The patient needs to follow up with Dr. Lucky Cowboy in about 2 weeks.   TIME SPENT: Approximately 35 minutes.   CONDITION: The patient was stable for discharge.   Plan of care was discussed with the family.   ____________________________ Donell Beers. Benjie Karvonen, MD spm:TT D: 04/07/2014 14:27:53 ET T: 04/07/2014 22:06:31 ET JOB#: [id number]  cc: Bristyn Kulesza P. Benjie Karvonen, MD, <Dictator> Algernon Huxley, MD Donell Beers Joud Pettinato MD ELECTRONICALLY  SIGNED 04/08/2014 10:28

## 2014-12-05 NOTE — H&P (Signed)
PATIENT NAME:  [name], [name] MR#:  [id number] DATE OF BIRTH:  04-08-1927  DATE OF ADMISSION:  04/05/2014  REFERRING PHYSICIAN: Brunilda Payor A. Edd Fabian, MD, in the Emergency Room.   FAMILY PHYSICIAN: Clayborn Bigness, MD.   REASON FOR ADMISSION: Syncope.   HISTORY OF PRESENT ILLNESS: The patient is an 81 year old female with a significant history of hypertension, hyperglycemia, previous carotid surgery, and previous cardiac arrest who presents to the Emergency Room with 2 separate episodes of syncope today. The patient [place] from [place] where she had been feeling weak. Initially, had a near-syncopal episode then had an actual syncopal episode. In the Emergency Room, the patient was in no acute distress, but was found to have an acute infarct noted on head CT consistent with acute stroke syndrome. She is now admitted for further evaluation.   PAST MEDICAL HISTORY: 1. Peripheral vascular disease, status post right carotid endarterectomy.  2. History of "cardiac arrest."  3. Benign hypertension.  4. Hyperglycemia.  5. Osteoarthritis.  6. Gout.  7. Chronic back pain.  8. Status post left hip surgery. 9. Status post back surgery.   MEDICATIONS: 1. Vitamin D3 1000 units p.o. daily.  2. Omeprazole 20 mg p.o. daily. 3. Iron sulfate 325 mg p.o. b.i.d.  4. Catapres-TTS 0.1 mg topically q. week.  5. Multivitamin 1 p.o. daily.  6. Benazepril 5 mg p.o. b.i.d.  7. Aspirin 81 mg p.o. daily.  8. Amlodipine 2.5 mg p.o. daily.  9. Allopurinol 200 mg p.o. daily.  10. Xanax 0.25 mg p.o. at bedtime p.r.n.   ALLERGIES: SULFA.   SOCIAL HISTORY: Negative for alcohol or tobacco abuse.   FAMILY HISTORY: Positive for coronary artery disease, stroke, and hypertension.   REVIEW OF SYSTEMS: .  CONSTITUTIONAL: No fever or change in weight.  EYES: No blurred or double vision. No glaucoma.  ENT: No tinnitus or hearing loss. No nasal discharge or bleeding. No difficulty swallowing.  RESPIRATORY: No cough or wheezing.  Denies hemoptysis.  CARDIOVASCULAR: No chest pain or orthopnea. No palpitations.  GASTROINTESTINAL: No nausea, vomiting, or diarrhea. No abdominal pain.  GENITOURINARY: No dysuria or hematuria. No incontinence.  ENDOCRINE: No polyuria or polydipsia. No heat or cold intolerance.  HEMATOLOGIC: The patient admits to anemia, but denies easy bruising or bleeding.  LYMPHATIC: No swollen glands.  MUSCULOSKELETAL: The patient has pain in her back but denies shoulder, knee, or hip pain. Does have gout but no recent flares.  NEUROLOGIC: No numbness or migraines. Denies previous stroke or seizures.  PSYCHIATRIC: The patient denies anxiety, insomnia or depression.   PHYSICAL EXAMINATION: GENERAL: The patient is in no acute distress.  VITAL SIGNS: Currently remarkable for a blood pressure of 155/52 with a heart rate of 76, respiratory rate of 18, temperature of 98.1, saturating 98% on room air.  HEENT: Normocephalic, atraumatic. Pupils equally round and reactive to light and accommodation. Extraocular movements are intact. Sclerae are anicteric. Conjunctivae are clear.  OROPHARYNX: Clear.  NECK: Supple without JVD. Soft bilateral carotid bruits are noted. No adenopathy or thyromegaly is present.  LUNGS: Clear to auscultation and percussion without wheezes, rales, or rhonchi. No dullness. Respiratory effort is normal.   CARDIAC: Regular rate and rhythm with normal S1, S2. No significant rubs, murmurs, or gallops. PMI is nondisplaced. Chest wall is nontender.  ABDOMEN: Soft, nontender, with normoactive bowel sounds. No organomegaly or masses were appreciated. No hernias or bruits were noted.  EXTREMITIES: Without clubbing, cyanosis, or edema. Pulses were 2+ bilaterally.  SKIN: Warm and dry without  rash or lesions.  NEUROLOGIC: Cranial nerves II through XII grossly intact. Deep tendon reflexes were symmetric. Motor and sensory exam is nonfocal.  PSYCHIATRIC: Revealed patient who was alert and oriented to  person, place, and time. She was cooperative and used good judgment.   LABORATORY DATA: EKG revealed sinus rhythm with no acute ischemic changes. Chest x-ray was unremarkable. Head CT revealed an acute infarct in the right frontotemporal junction. Mild atrophy was noted diffusely. Her white count was 10.7 with a hemoglobin of 11.5. Troponin was less than 0.02. Glucose 168 with a BUN of 38, creatinine 1.59 with a GFR of 29.   ASSESSMENT: 1. Acute stroke syndrome.  2. Syncope.  3. Stage III chronic kidney disease.  4. Anemia of chronic disease.  5. Hyperglycemia.  6. Benign hypertension.  7. Peripheral vascular disease.   PLAN: The patient will be admitted to telemetry with Plavix and subcutaneous heparin. We will check on her lipid status and an A1c in the morning. Neuro checks q. 4 hours. We will monitor her blood pressure closely. We will follow her sugars with Accu-Cheks before meals and at bedtime and add sliding scale insulin as needed. Due to acute stroke, we will obtain carotid Dopplers and an echocardiogram. Social work consult for rehab placement. Soft diet for now. Further treatment and evaluation will depend upon the patient's progress.   Total time spent on this patient is 50 minutes.    ____________________________ Leonie Douglas Doy Hutching, MD jds:NT D: 04/05/2014 17:25:40 ET T: 04/05/2014 17:36:28 ET JOB#: [id number]  cc: Leonie Douglas. Doy Hutching, MD, <Dictator> Lavera Guise, MD Scott Vanderveer Lennice Sites MD ELECTRONICALLY SIGNED 04/06/2014 10:16

## 2014-12-05 NOTE — Consult Note (Signed)
CHIEF COMPLAINT and HISTORY:  Subjective/Chief Complaint syncope   History of Present Illness Patient who I have seen for CAS presented with syncope.  Had lightheadedness and felt faint after a BM yesterday.  Laid down on the bed and passed out.  Her family took BP which was very high at first, and she sat up and it dropped very low.  Has been better here.  no residual deficits noted.   Had multiple imaging studies.  CT shows possible very small right sided stroke.  US shows velocities just into the >70% range on the left with a patent left CEA.  Her CTA shows a 75-80% left ICA stenosis, which is a little more than her last visit to the office, but has been in the 70% range for several years.   PAST MEDICAL/SURGICAL HISTORY:  Past Medical History:   cardiac arrest:    HTN:    UTI:    ARTHRITIS:    GOUT:    Back Pain, Chronic:    Hip Replacement - Left:    Back Surgery:   ALLERGIES:  Allergies:  Sulfa drugs: Other  HOME MEDICATIONS:  Home Medications: Medication Instructions Status  benazepril 5 mg oral tablet 1 tab(s) orally 2 times a day Active  Bayer Low Dose 81 mg oral delayed release tablet 1 tab(s) orally once a day Active  omeprazole 20 mg oral delayed release capsule 1 cap(s) orally once a day for belching and stomach Active  Vitamin D3 1000 intl units oral tablet 1 tab(s) orally once a day Active  Central-Vite 1 tab(s) orally once a day Active  cloNIDine 0.1 mg/24 hr transdermal film, extended release 1 patch transdermal once a week on Friday. Active  ferrous sulfate 325 mg oral tablet 1 tab(s) orally 2 times a day Active  allopurinol 100 mg oral tablet 2 tabs ($Remov'200mg'SvDgmp$ ) orally once a day (at bedtime) for gout. Active  ALPRAZolam 0.25 mg oral tablet 1 tab(s) orally once a day (at bedtime) Active  amLODIPine 2.5 mg oral tablet 1 tab(s) orally once a day Active   Family and Social History:  Family History Non-Contributory   Social History negative tobacco, negative  ETOH   Place of Living Home   Review of Systems:  Subjective/Chief Complaint syncope   Fever/Chills No   Cough No   Sputum No   Abdominal Pain No   Diarrhea No   Constipation No   Nausea/Vomiting No   SOB/DOE No   Chest Pain No   Telemetry Reviewed NSR   Dysuria No   Tolerating PT Yes   Tolerating Diet Yes   Medications/Allergies Reviewed Medications/Allergies reviewed   Physical Exam:  GEN well developed, well nourished   HEENT pink conjunctivae, moist oral mucosa   NECK No masses  trachea midline   RESP normal resp effort  no use of accessory muscles   CARD regular rate  positive carotid bruits  no JVD   VASCULAR ACCESS none   ABD denies tenderness  soft   GU no superpubic tenderness   LYMPH negative neck, negative axillae   EXTR negative cyanosis/clubbing, negative edema   SKIN normal to palpation, skin turgor good   NEURO cranial nerves intact, motor/sensory function intact   PSYCH alert, A+O to time, place, person   LABS:  Laboratory Results: LabObservation:    24-Aug-15 08:36, Echo Doppler  OBSERVATION   Reason for Test  Hepatic:    23-Aug-15 15:43, Hepatic Function Panel A  Bilirubin, Total 0.3  Bilirubin, Direct <  0.1  Result(s) reported on 05 Apr 2014 at 04:26PM.  Alkaline Phosphatase 120  46-116  NOTE: New Reference Range  03/03/14  SGPT (ALT) 23  14-63  NOTE: New Reference Range  03/03/14  SGOT (AST) 31  Total Protein, Serum 7.3  Albumin, Serum 3.5  Cardiology:    23-Aug-15 14:45, ED ECG  Ventricular Rate 67  Atrial Rate 67  P-R Interval 248  QRS Duration 82  QT 388  QTc 409  P Axis 79  R Axis -3  T Axis 8  ECG interpretation   Sinus rhythm with 1st degree A-V block  Otherwise normal ECG  When compared with ECG of 19-Jun-2013 22:10,  Sinus rhythm has replaced Junctional rhythm  ----------unconfirmed----------  Confirmed by OVERREAD, NOT ([id number]), editor PEARSON, BARBARA ([id number]) on 04/06/2014 1:11:14 PM  ED ECG    Routine Chem:    23-Aug-15 64:33, Basic Metabolic Panel (w/Total Calcium)  Glucose, Serum 168  BUN 38  Creatinine (comp) 1.59  Sodium, Serum 141  Potassium, Serum 4.4  Chloride, Serum 108  CO2, Serum 23  Calcium (Total), Serum 9.1  Anion Gap 10  Osmolality (calc) 294  eGFR (African American) 34  eGFR (Non-African American) 29  eGFR values <69mL/min/1.73 m2 may be an indication of chronic  kidney disease (CKD).  Calculated eGFR is useful in patients with stable renal function.  The eGFR calculation will not be reliable in acutely ill patients  when serum creatinine is changing rapidly. It is not useful in   patients on dialysis. The eGFR calculation may not be applicable  to patients at the low and high extremes of body sizes, pregnant  women, and vegetarians.    23-Aug-15 15:43, Magnesium, Serum  Magnesium, Serum 2.0  1.8-2.4  THERAPEUTIC RANGE: 4-7 mg/dL  TOXIC: > 10 mg/dL   -----------------------    24-Aug-15 29:51, Basic Metabolic Panel (w/Total Calcium)  Glucose, Serum 94  BUN 32  Creatinine (comp) 1.42  Sodium, Serum 142  Potassium, Serum 4.2  Chloride, Serum 108  CO2, Serum 24  Calcium (Total), Serum 8.3  Anion Gap 10  Osmolality (calc) 290  eGFR (African American) 39  eGFR (Non-African American) 34  eGFR values <75mL/min/1.73 m2 may be an indication of chronic  kidney disease (CKD).  Calculated eGFR is useful in patients with stable renal function.  The eGFR calculation will not be reliable in acutely ill patients  when serum creatinine is changing rapidly. It is not useful in   patients on dialysis. The eGFR calculation may not be applicable  to patients at the low and high extremes of body sizes, pregnant  women, and vegetarians.    24-Aug-15 04:20, Hemoglobin A1c ([facility])  Hemoglobin A1c ([facility]) 6.7  The American Diabetes Association recommends that a primary goal of  therapy should be <7% and that physicians should reevaluate the  treatment regimen in  patients with HbA1c values consistently >8%.    24-Aug-15 04:20, Lipid Profile [facility])  Cholesterol, Serum 164  Triglycerides, Serum 108  HDL (INHOUSE) 40  VLDL Cholesterol Calculated 22  LDL Cholesterol Calculated 102  Result(s) reported on 06 Apr 2014 at 05:05AM.  Cardiac:    23-Aug-15 15:43, Troponin I  Troponin I < 0.02  0.00-0.05  0.05 ng/mL or less: NEGATIVE   Repeat testing in 3-6 hrs   if clinically indicated.  >0.05 ng/mL: POTENTIAL   MYOCARDIAL INJURY. Repeat   testing in 3-6 hrs if   clinically indicated.  NOTE: An increase or decrease   of 30%  or more on serial   testing suggests a   clinically important change  Routine UA:    23-Aug-15 20:33, Urinalysis  Color (UA) Yellow  Clarity (UA) Cloudy  Glucose (UA) Negative  Bilirubin (UA) Negative  Ketones (UA) Negative  Specific Gravity (UA) 1.011  Blood (UA) 1+  pH (UA) 5.0  Protein (UA) 30 mg/dL  Nitrite (UA) Positive  Leukocyte Esterase (UA) Trace  Result(s) reported on 05 Apr 2014 at 09:13PM.  RBC (UA) <1 /HPF  WBC (UA) 10 /HPF  Bacteria (UA)   NONE SEEN  Epithelial Cells (UA) <1 /HPF  Result(s) reported on 05 Apr 2014 at 09:13PM.  Routine Hem:    23-Aug-15 15:43, CBC Profile  WBC (CBC) 10.7  RBC (CBC) 3.47  Hemoglobin (CBC) 11.5  Hematocrit (CBC) 35.0  Platelet Count (CBC) 229  MCV 101  MCH 33.2  MCHC 32.9  RDW 14.4  Neutrophil % 79.8  Lymphocyte % 13.1  Monocyte % 5.0  Eosinophil % 1.5  Basophil % 0.6  Neutrophil # 8.6  Lymphocyte # 1.4  Monocyte # 0.5  Eosinophil # 0.2  Basophil # 0.1  Result(s) reported on 05 Apr 2014 at 03:56PM.    24-Aug-15 04:20, CBC Profile  WBC (CBC) 12.8  RBC (CBC) 2.99  Hemoglobin (CBC) 9.7  Hematocrit (CBC) 30.0  Platelet Count (CBC) 219  MCV 100  MCH 32.4  MCHC 32.3  RDW 14.4  Neutrophil % 67.6  Lymphocyte % 25.4  Monocyte % 6.1  Eosinophil % 0.6  Basophil % 0.3  Neutrophil # 8.7  Lymphocyte # 3.3  Monocyte # 0.8  Eosinophil # 0.1  Basophil #  0.0  Result(s) reported on 06 Apr 2014 at 05:01AM.   RADIOLOGY:  Radiology Results: XRay:    06-Nov-14 22:53, Chest Portable Single View  Chest Portable Single View  REASON FOR EXAM:    syncope  COMMENTS:       PROCEDURE: DXR - DXR PORTABLE CHEST SINGLE VIEW  - Jun 19 2013 10:53PM     CLINICAL DATA:  Syncope.    EXAM:  PORTABLE CHEST - 1 VIEW    COMPARISON:  Chest x-ray 12/20/2007.    FINDINGS:  Lung volumes are normal. No consolidative airspace disease. No  pleural effusions. No pneumothorax. No pulmonary nodule or mass  noted. Pulmonary vasculature and the cardiomediastinal silhouette  are within normal limits. Atherosclerosis in the thoracic aorta.     IMPRESSION:  1.  No radiographic evidence of acute cardiopulmonary disease.  2. Atherosclerosis.      Electronically Signed    By: Vinnie Langton M.D.    On: 06/19/2013 22:54         Verified By: Etheleen Mayhew, M.D.,    23-Aug-15 15:33, Chest Portable Single View  Chest Portable Single View  REASON FOR EXAM:    syncope  COMMENTS:       PROCEDURE: DXR - DXR PORTABLE CHEST SINGLE VIEW  - Apr 05 2014  3:33PM     CLINICAL DATA:  Syncope and hypertension    EXAM:  PORTABLE CHEST - 1 VIEW    COMPARISON:  June 19, 2013    FINDINGS:  There isa degree of underlying emphysematous change. There is no  edema or consolidation. Heart is upper normal in size with pulmonary  vascularity within normal limits. There is atherosclerotic change in  the aorta. No adenopathy. Calcific tendinosis is noted in the right  shoulder region. There is also generalized osteoarthritic change in  the  right shoulder.     IMPRESSION:  Underlying emphysematous change.  No edema or consolidation.      Electronically Signed    By: Bretta Bang M.D.    On: 04/05/2014 15:41         Verified By: Rutherford Guys. WOODRUFF, M.D.,  [place]:    07-Nov-14 15:00, US Carotid Doppler Bilateral  US Carotid Doppler Bilateral  REASON FOR  EXAM:    syncope  COMMENTS:       PROCEDURE: [place]  - US CAROTID DOPPLER BILATERAL  - Jun 20 2013  3:00PM     CLINICAL DATA:  Syncope ; history of prior carotid stenosis status  post right endarterectomy approximately 5 years ago    EXAM:  BILATERAL CAROTID DUPLEX ULTRASOUND    TECHNIQUE:  Wallace Cullens scale imaging, color Doppler and duplex ultrasound were  performed of bilateral carotid and vertebral arteries in the neck.  COMPARISON:  Prior duplex carotid ultrasound 05/18/2008; prior  carotid CTA 05/19/2008    FINDINGS:  Criteria: Quantification of carotid stenosis is based on velocity  parameters that correlate the residual internal carotid diameter  with NASCET-based stenosis levels, using the diameter of the distal  internal carotid lumen as the denominator for stenosis measurement.    The following velocity measurements were obtained:    RIGHT    ICA:  89/23 cm/sec  CCA:  104/17 cm/sec    SYSTOLIC ICA/CCA RATIO:  1.0    DIASTOLIC ICA/CCA RATIO:  1.3    ECA:  104 cm/sec    LEFT    ICA:  233/59 cm/sec    CCA:  107/19 cm/sec    SYSTOLIC ICA/CCA RATIO:  2.2  DIASTOLIC ICA/CCA RATIO:  3.1    ECA:  102 cm/sec    RIGHT CAROTID ARTERY: Widely patent carotid endarterectomy. Minimal  recurrent intimal medial thickening without significant stenosis by  grayscale, color Doppler, pulsed Doppler or spectral waveform  analysis.    RIGHT VERTEBRAL ARTERY:  Patent with normal antegrade flow.    LEFT CAROTID ARTERY: Heterogeneous atherosclerotic and partially  calcified plaque in the distal common carotid artery extending into  the internal carotid artery. The peak systolic velocity greater than  230 cm/sec is consistent with a greater than 70% diameter stenosis.  On the prior duplex ultrasound study from 2009, the peak systolic  velocity of the proximal internal carotid artery was 177 cm/sec  which would have been consistent with a 50- 69% diameter stenosis.  There is  associated high velocity jetting and post stenotic  turbulence with spectral broadening.    LEFT VERTEBRAL ARTERY:  Patent with normal antegrade flow.     IMPRESSION:  1. Heterogeneous and partially calcified atherosclerotic plaque  results in greater than 70% estimated diameter stenosis in the left  internal carotid artery. Using standard peak systolic velocity  criteria, the degree of stenosis has progressed compared to 2009.  2. Widely patent right internal carotid endarterectomy with minimal  residual/recurrent atherosclerotic plaque but no significant  stenosis.  3. Bilateral vertebral arteries are patent with normal antegrade  flow.  Signed,    Sterling Big, MD    [facility]    [facility]      Electronically Signed    By: Malachy Moan M.D.    On: 06/20/2013 15:17     Verified By: Sterling Big, M.D.,    24-Aug-15 09:40, US Carotid Doppler Bilateral  US Carotid Doppler Bilateral  REASON FOR EXAM:  CVA  COMMENTS:       PROCEDURE: [place]  - US CAROTID DOPPLER BILATERAL  - Apr 06 2014  9:40AM     CLINICAL DATA:  History of hypertension, stroke, right-sided carotid  endarterectomy (1999).    EXAM:  BILATERAL CAROTID DUPLEX ULTRASOUND    TECHNIQUE:  Pearline Cables scale imaging, color Doppler and duplex ultrasound were  performed of bilateral carotid and vertebral arteries in the neck.  COMPARISON:  Carotid Doppler ultrasound -06/20/2013; 05/18/2008;  Head CT- 04/05/2014    FINDINGS:  Criteria: Quantification of carotid stenosis is based on velocity  parameters that correlate the residual internal carotid diameter  with NASCET-based stenosis levels, using the diameter of the distal  internal carotid lumen as the denominator for stenosis measurement.    The following velocity measurements were obtained:    RIGHT    ICA:  115/25 cm/sec  CCA:  25/05 cm/sec    SYSTOLIC ICA/CCA RATIO:  3.97    DIASTOLIC  ICA/CCA RATIO:  2.38    ECA:  115 cm/sec    LEFT    ICA:  309/33 cm/sec    CCA:  67/34 cm/sec    SYSTOLIC ICA/CCA RATIO:  1.93  DIASTOLIC ICA/CCA RATIO:  7.90    ECA:  124 cm/sec    RIGHT CAROTID ARTERY: Stable postsurgical change of the right  carotid bulb. There is a minimal amount of intimal thickening  involving the origin proximal aspect of the right internal carotid  artery (image 17), not resulting in elevated peak systolic  velocities within the interrogated course of the right internal  carotid artery to suggest a hemodynamically significant stenosis.    RIGHT VERTEBRALARTERY:  Antegrade flow    LEFT CAROTID ARTERY: There is a moderate to large amount of  eccentric mixed echogenic plaque within the left carotid bulb (image  47), extending to involve the origin and proximal aspect of the left  internal carotid artery (image 54), likely minimally progressed in  the interval resulting in elevated peak systolic velocities  throughout the left internal carotid artery (greatest at its mid  aspect measuring 309 cm/sec - image 59), previous maximum peak  systolic velocity was within the proximal internal carotid artery,  previously measuring 233 cm/sec.    LEFT VERTEBRAL ARTERY:  Antegrade flow     IMPRESSION:  1. Suspected worsening of now moderate to large amount of left-sided  atherosclerotic plaque resulting inhigher elevated peak systolic  velocities within the left internal carotid artery compatible with  the greater than 70% luminal narrowing range. Further evaluation  could be performed with CTA as clinically indicated.  2. Grossly stable postsurgical change of the right carotid bulb  without evidence of a recurrent hemodynamically significant  stenosis.      Electronically Signed    By: Sandi Mariscal M.D.    On: 04/06/2014 09:56         Verified By: Aileen Fass, M.D.,  [place]:    24-OXB-35 22:53, Chest Portable Single View  PACS Image     [phone] 15:00, US Carotid Doppler Bilateral  PACS Image    17-Mar-15 14:17, CT Abdomen and Pelvis Without Contrast  PACS Image    23-Aug-15 15:33, Chest Portable Single View  PACS Image    23-Aug-15 16:01, CT Head Without Contrast  PACS Image    24-Aug-15 09:40, US Carotid Doppler Bilateral  PACS Image    24-Aug-15 15:10, CT Angiography Neck (Carotids)  PACS Image  CT:    17-Mar-15 14:17, CT Abdomen  and Pelvis Without Contrast  CT Abdomen and Pelvis Without Contrast  REASON FOR EXAM:    Chronic Kidney Disease Hematuria Abn US Done Mar 5th   Dr Office  COMMENTS:       PROCEDURE: CT  - CT ABDOMEN AND PELVIS W0  - Oct 28 2013  2:17PM     CLINICAL DATA:  Abnormal ultrasound examination. Chronic kidney  disease. Hematuria.    EXAM:  CT ABDOMEN AND PELVIS WITHOUT CONTRAST    TECHNIQUE:  Multidetector CT imaging of the abdomen and pelvis was performed  following the standard protocol without intravenous contrast.  COMPARISON:  No priors. Report from renal ultrasounddated  10/16/2013.    FINDINGS:  Lung Bases: Atherosclerosis in the right coronary artery.  Calcifications of the mitral annulus.    Abdomen/Pelvis: There are no abnormal calcifications within the  collecting system of either kidney, along the courseof either  ureter, or within the lumen of the urinary bladder. No  hydroureteronephrosis or perinephric stranding to suggest urinary  tract obstruction at this time. The unenhanced appearance of the  kidneys is unremarkable bilaterally. Mild bilateral perinephric  stranding is noted, but is nonspecific.  The unenhanced appearance of the liver, pancreas, spleen and  bilateral adrenal glands is unremarkable. The gallbladder is not  visualized and may be surgically absent; no surgical clips are noted  in the region of the gallbladder fossa. The inferior vena cava  appears very diminutive in size, best demonstrated on image 28 of  series 2. Renal veins also appear  relatively small in size. No large  venous collaterals are identified at this time to strongly suggest  presence of chronic IVC occlusion. Extensive atherosclerosis  throughout the abdominal and pelvic vasculature, without evidence of  aneurysm. No significant volume of ascites. No pneumoperitoneum. No  pathologic distention of small bowel. Several colonic diverticulae  are noted, without definite surrounding inflammatory changes to  suggest an acute diverticulitis at this time. No definite  pathologically enlarged lymph nodes are identified within the  abdomen or pelvis on today's non contrast CT examination. Uterus and  ovaries are atrophic. Urinary bladder is unremarkable in appearance.    Musculoskeletal: Status post left total hip arthroplasty. There are  no aggressive appearing lytic or blastic lesions noted in the  visualized portions of the skeleton. Bilateral pars defects are  present at L4 and there has been anterior migration of the L4  vertebral body. Associated with this there is 9 mm of retrolisthesis  of L3 upon L4, and 12 mm of anterolisthesis of L4 upon L5. The  central spinal canal appears markedly narrowed at the level of  L4-L5, best demonstrated on image 42 of series 2, particularly on  the right side. Severe multilevel degenerative disc disease is noted  throughout the visualized thoracolumbar spine, with multiple  prominent anterior osteophytes, including a large osteophyte at  L3-L4 which impinges upon the adjacent inferior vena cava. Old  compression fracture T11 with approximately 30% loss of anterior  vertebral body height.     IMPRESSION:  1. No urinary tract calculi. No findings of urinary tract  obstruction at this time.  2. Mild bilateral perinephric stranding which is a nonspecific  finding which can be seen in the setting of renal insufficiency.  3. Bilateral pars defects at L4 with anterior displacement of the L4  vertebral body, and severe  multilevel degenerative disease  throughout the visualized thoracolumbar spine, as discussed above.  Associated with this, there is a prominent anterior  vertebral  osteophyte at L3-L4 which appears to compress the adjacent inferior  vena cava, above which, the inferior vena cava is diminutive in  size. This finding is favored to be related to patient positioning,  state of respiration, and probable hypovolemia, rather than  indicative of obstruction of the inferior vena cava (as there are no  large collateral pathways noted).  4. In addition, at L4-L5 there is likely severe spinal stenosis.  These findings could be better evaluated with MRI of the lumbar  spine if of clinical concern.  5. Colonic diverticulosis without findings to suggest acute  diverticulitis at this time.  6. Extensive atherosclerosis, including right coronary artery  disease.  7. Additional incidental findings, as above.      Electronically Signed    IR:[id number]  Entrikin M.D.    On: 10/28/2013 17:27     Verified By: Etheleen Mayhew, M.D.,    23-Aug-15 16:01, CT Head Without Contrast  CT Head Without Contrast  REASON FOR EXAM:    syncope and weakness  COMMENTS:       PROCEDURE: CT  - CT HEAD WITHOUT CONTRAST  - Apr 05 2014  4:01PM     CLINICAL DATA:  Syncope    EXAM:  CT HEAD WITHOUT CONTRAST    TECHNIQUE:  Contiguous axial images were obtained from the base of the skull  through the vertex without intravenous contrast.    COMPARISON:  Dec 20, 2007  FINDINGS:  There is mild diffuse atrophy. There is no mass, hemorrhage,  extra-axial fluid collection, or midline shift. There is patchy  small vessel disease in the centra semiovale bilaterally.    In comparison with the prior study, there is decreased attenuation  at the right posterior frontal -superior anterior temporal junction  which appear slightly more prominent than on previous study and is  concerning for a small acute infarct in this area. No  other findings  suggesting potential acute infarct. Bony calvarium appears intact.  The mastoid air cells are clear.     IMPRESSION:  Question acute infarct at the right frontal -temporal junction at  the mid lateral ventricular level. Elsewhere there is mild atrophy  with patchy periventricular small vessel disease which otherwise  appear stable compared to the prior study. No hemorrhage or mass  effect.      Electronically Signed    By: Olen Pel M.D.    On: 04/05/2014 16:15         Verified By: Leafy Kindle. WOODRUFF, M.D.,    24-Aug-15 15:10, CT Angiography Neck (Carotids)  CT Angiography Neck (Carotids)  REASON FOR EXAM:    pleae eval for CAS thanks  COMMENTS:       PROCEDURE: CT  - CT ANGIOGRAPHY NECK W/CONTRAST  - Apr 06 2014  3:10PM     CLINICAL DATA:  Generalized weakness with near syncopal episode.  Previous endarterectomy 2009 of the RIGHT carotid.    EXAM:  CT ANGIOGRAPHY NECK    TECHNIQUE:  Multidetector CT imaging of the neck was performed using the  standard protocol during bolus administration of intravenous  contrast. Multiplanar CT image reconstructions and MIPs were  obtained to evaluate the vascular anatomy. Carotid stenosis  measurements (when applicable) are obtained utilizing NASCET  criteria, using the distal internal carotid diameter as the  denominator.    CONTRAST:  Isovue 300, 60 mL.    COMPARISON:  CT head 04/05/2014.CT angio neck 05/19/2008.    FINDINGS:  Lung apices clear. Mild atheromatous change of  the transverse arch.  Conventional branching of the great vessels without proximal  stenosis. LEFT vertebral dominant.    The RIGHT carotid bifurcation is widely patent status post previous  endarterectomy. There is moderate dolichoectasia without residual  stenosis. The RIGHT external carotid is also widelypatent.    At the LEFT carotid bifurcation there is significant calcified and  non-calcified plaque beginning at the distal  common carotid and  extending into the proximal LEFT internal carotid. Luminal  measurement of 1.1 mm in its most narrow point, compares with a  cervical ICA measurement of 4.7 mm corresponding to a 75-80%  stenosis.    No carotid dissection on the RIGHT or LEFT.    Both vertebral arteries are patent with the LEFT dominant. No ostial  stenosis.  No neck masses. Mucous fills the RIGHT vallecula. Advanced cervical  spondylosis. No visible sinus or mastoid disease.    Review of the MIP images confirms the above findings.     IMPRESSION:  75-80% stenosis at the LEFT carotid bifurcation. There is both  calcific and soft plaque. Good general agreement with prior duplex  carotid ultrasound earlier today. Progression of disease on the LEFT  compared with 2009.      Electronically Signed    By: Rolla Flatten M.D.    On: 04/06/2014 15:35     Verified By: Staci Righter, M.D.,   ASSESSMENT AND PLAN:  Assessment/Admission Diagnosis left CAS, 75-80% possible small right hemispheric stroke syncope   Plan Had multiple imaging studies.  CT shows possible very small right sided stroke.  US shows velocities just into the >70% range on the left with a patent left CEA.  Her CTA shows a 75-80% left ICA stenosis, which is a little more than her last visit to the office, but has been in the 70% range for several years.   Her left ICA stenosis did not cause a right sided stroke. Could be a contributing factor to syncope, but sounds more vagal response.  We have discussed surgery for some time, and she has been at or just below our threshold of 80% for asymptomatic person in their 19s.  Would continue medical management and I will see in office in 2-3 weeks.  Has been complaining of left leg pain and swelling, and will order venous duplex to evaluate for DVT and possible paradoxical embolus.     level 4   Electronic Signatures: Algernon Huxley (MD)  (Signed 26-Aug-15 09:42)  Authored: Chief Complaint  and History, PAST MEDICAL/SURGICAL HISTORY, ALLERGIES, HOME MEDICATIONS, Family and Social History, Review of Systems, Physical Exam, LABS, RADIOLOGY, Assessment and Plan   Last Updated: 26-Aug-15 09:42 by Algernon Huxley (MD)

## 2015-01-19 DIAGNOSIS — N183 Chronic kidney disease, stage 3 (moderate): Secondary | ICD-10-CM | POA: Diagnosis not present

## 2015-01-19 DIAGNOSIS — I10 Essential (primary) hypertension: Secondary | ICD-10-CM | POA: Diagnosis not present

## 2015-01-19 DIAGNOSIS — I679 Cerebrovascular disease, unspecified: Secondary | ICD-10-CM | POA: Diagnosis not present

## 2015-01-19 DIAGNOSIS — I6523 Occlusion and stenosis of bilateral carotid arteries: Secondary | ICD-10-CM | POA: Diagnosis not present

## 2015-01-25 DIAGNOSIS — E119 Type 2 diabetes mellitus without complications: Secondary | ICD-10-CM | POA: Diagnosis not present

## 2015-03-25 DIAGNOSIS — D519 Vitamin B12 deficiency anemia, unspecified: Secondary | ICD-10-CM | POA: Diagnosis not present

## 2015-03-25 DIAGNOSIS — R7301 Impaired fasting glucose: Secondary | ICD-10-CM | POA: Diagnosis not present

## 2015-03-25 DIAGNOSIS — E871 Hypo-osmolality and hyponatremia: Secondary | ICD-10-CM | POA: Diagnosis not present

## 2015-03-25 DIAGNOSIS — I10 Essential (primary) hypertension: Secondary | ICD-10-CM | POA: Diagnosis not present

## 2015-03-25 DIAGNOSIS — E559 Vitamin D deficiency, unspecified: Secondary | ICD-10-CM | POA: Diagnosis not present

## 2015-03-25 DIAGNOSIS — D509 Iron deficiency anemia, unspecified: Secondary | ICD-10-CM | POA: Diagnosis not present

## 2015-03-25 DIAGNOSIS — R5383 Other fatigue: Secondary | ICD-10-CM | POA: Diagnosis not present

## 2015-03-25 DIAGNOSIS — N390 Urinary tract infection, site not specified: Secondary | ICD-10-CM | POA: Diagnosis not present

## 2015-05-05 DIAGNOSIS — D519 Vitamin B12 deficiency anemia, unspecified: Secondary | ICD-10-CM | POA: Diagnosis not present

## 2015-05-11 DIAGNOSIS — E785 Hyperlipidemia, unspecified: Secondary | ICD-10-CM | POA: Diagnosis not present

## 2015-05-11 DIAGNOSIS — M79609 Pain in unspecified limb: Secondary | ICD-10-CM | POA: Diagnosis not present

## 2015-05-11 DIAGNOSIS — I2510 Atherosclerotic heart disease of native coronary artery without angina pectoris: Secondary | ICD-10-CM | POA: Diagnosis not present

## 2015-05-11 DIAGNOSIS — I6529 Occlusion and stenosis of unspecified carotid artery: Secondary | ICD-10-CM | POA: Diagnosis not present

## 2015-05-11 DIAGNOSIS — I10 Essential (primary) hypertension: Secondary | ICD-10-CM | POA: Diagnosis not present

## 2015-05-11 DIAGNOSIS — I6523 Occlusion and stenosis of bilateral carotid arteries: Secondary | ICD-10-CM | POA: Diagnosis not present

## 2015-06-08 DIAGNOSIS — N183 Chronic kidney disease, stage 3 (moderate): Secondary | ICD-10-CM | POA: Diagnosis not present

## 2015-06-08 DIAGNOSIS — Z0001 Encounter for general adult medical examination with abnormal findings: Secondary | ICD-10-CM | POA: Diagnosis not present

## 2015-06-08 DIAGNOSIS — I6523 Occlusion and stenosis of bilateral carotid arteries: Secondary | ICD-10-CM | POA: Diagnosis not present

## 2015-06-08 DIAGNOSIS — I10 Essential (primary) hypertension: Secondary | ICD-10-CM | POA: Diagnosis not present

## 2015-06-08 DIAGNOSIS — D519 Vitamin B12 deficiency anemia, unspecified: Secondary | ICD-10-CM | POA: Diagnosis not present

## 2015-06-08 DIAGNOSIS — I679 Cerebrovascular disease, unspecified: Secondary | ICD-10-CM | POA: Diagnosis not present

## 2015-08-04 DIAGNOSIS — E782 Mixed hyperlipidemia: Secondary | ICD-10-CM | POA: Diagnosis not present

## 2015-08-04 DIAGNOSIS — Z0001 Encounter for general adult medical examination with abnormal findings: Secondary | ICD-10-CM | POA: Diagnosis not present

## 2015-08-04 DIAGNOSIS — E559 Vitamin D deficiency, unspecified: Secondary | ICD-10-CM | POA: Diagnosis not present

## 2015-08-04 DIAGNOSIS — I10 Essential (primary) hypertension: Secondary | ICD-10-CM | POA: Diagnosis not present

## 2015-09-07 DIAGNOSIS — D519 Vitamin B12 deficiency anemia, unspecified: Secondary | ICD-10-CM | POA: Diagnosis not present

## 2015-09-07 DIAGNOSIS — I10 Essential (primary) hypertension: Secondary | ICD-10-CM | POA: Diagnosis not present

## 2015-09-07 DIAGNOSIS — I679 Cerebrovascular disease, unspecified: Secondary | ICD-10-CM | POA: Diagnosis not present

## 2015-09-07 DIAGNOSIS — I6523 Occlusion and stenosis of bilateral carotid arteries: Secondary | ICD-10-CM | POA: Diagnosis not present

## 2015-09-07 DIAGNOSIS — N183 Chronic kidney disease, stage 3 (moderate): Secondary | ICD-10-CM | POA: Diagnosis not present

## 2015-10-05 ENCOUNTER — Ambulatory Visit
Admission: RE | Admit: 2015-10-05 | Discharge: 2015-10-05 | Disposition: A | Discharge status: Home / Self Care | Payer: Commercial Managed Care - HMO | Source: Ambulatory Visit | Attending: Nurse Practitioner | Admitting: Nurse Practitioner

## 2015-10-05 ENCOUNTER — Other Ambulatory Visit: Payer: Self-pay | Admitting: Nurse Practitioner

## 2015-10-05 DIAGNOSIS — Z96642 Presence of left artificial hip joint: Secondary | ICD-10-CM | POA: Diagnosis not present

## 2015-10-05 DIAGNOSIS — M545 Low back pain: Secondary | ICD-10-CM

## 2015-10-05 DIAGNOSIS — D519 Vitamin B12 deficiency anemia, unspecified: Secondary | ICD-10-CM | POA: Diagnosis not present

## 2015-10-05 DIAGNOSIS — Z471 Aftercare following joint replacement surgery: Secondary | ICD-10-CM | POA: Diagnosis not present

## 2015-10-05 DIAGNOSIS — M5136 Other intervertebral disc degeneration, lumbar region: Secondary | ICD-10-CM | POA: Diagnosis not present

## 2015-10-05 DIAGNOSIS — M25552 Pain in left hip: Secondary | ICD-10-CM

## 2015-11-02 DIAGNOSIS — D519 Vitamin B12 deficiency anemia, unspecified: Secondary | ICD-10-CM | POA: Diagnosis not present

## 2015-11-04 DIAGNOSIS — J0191 Acute recurrent sinusitis, unspecified: Secondary | ICD-10-CM | POA: Diagnosis not present

## 2015-11-04 DIAGNOSIS — J111 Influenza due to unidentified influenza virus with other respiratory manifestations: Secondary | ICD-10-CM | POA: Diagnosis not present

## 2015-12-07 DIAGNOSIS — D519 Vitamin B12 deficiency anemia, unspecified: Secondary | ICD-10-CM | POA: Diagnosis not present

## 2015-12-14 DIAGNOSIS — M79609 Pain in unspecified limb: Secondary | ICD-10-CM | POA: Diagnosis not present

## 2015-12-14 DIAGNOSIS — E785 Hyperlipidemia, unspecified: Secondary | ICD-10-CM | POA: Diagnosis not present

## 2015-12-14 DIAGNOSIS — I6529 Occlusion and stenosis of unspecified carotid artery: Secondary | ICD-10-CM | POA: Diagnosis not present

## 2015-12-14 DIAGNOSIS — I2510 Atherosclerotic heart disease of native coronary artery without angina pectoris: Secondary | ICD-10-CM | POA: Diagnosis not present

## 2015-12-14 DIAGNOSIS — I10 Essential (primary) hypertension: Secondary | ICD-10-CM | POA: Diagnosis not present

## 2015-12-14 DIAGNOSIS — I6523 Occlusion and stenosis of bilateral carotid arteries: Secondary | ICD-10-CM | POA: Diagnosis not present

## 2016-01-04 DIAGNOSIS — H60519 Acute actinic otitis externa, unspecified ear: Secondary | ICD-10-CM | POA: Diagnosis not present

## 2016-01-04 DIAGNOSIS — I679 Cerebrovascular disease, unspecified: Secondary | ICD-10-CM | POA: Diagnosis not present

## 2016-01-04 DIAGNOSIS — I739 Peripheral vascular disease, unspecified: Secondary | ICD-10-CM | POA: Diagnosis not present

## 2016-01-04 DIAGNOSIS — I10 Essential (primary) hypertension: Secondary | ICD-10-CM | POA: Diagnosis not present

## 2016-01-04 DIAGNOSIS — D519 Vitamin B12 deficiency anemia, unspecified: Secondary | ICD-10-CM | POA: Diagnosis not present

## 2016-02-01 DIAGNOSIS — D519 Vitamin B12 deficiency anemia, unspecified: Secondary | ICD-10-CM | POA: Diagnosis not present

## 2016-02-29 DIAGNOSIS — D519 Vitamin B12 deficiency anemia, unspecified: Secondary | ICD-10-CM | POA: Diagnosis not present

## 2016-03-28 DIAGNOSIS — D519 Vitamin B12 deficiency anemia, unspecified: Secondary | ICD-10-CM | POA: Diagnosis not present

## 2016-05-02 DIAGNOSIS — D519 Vitamin B12 deficiency anemia, unspecified: Secondary | ICD-10-CM | POA: Diagnosis not present

## 2016-06-02 IMAGING — CT CT ANGIOGRAPHY NECK
1 of 9 series · 6 of 34 positions shown · IV contrast (APPLIED)
Comparison: CT head 04/05/2014.  CT angio neck 05/19/2008.

CLINICAL DATA: Generalized weakness with near syncopal episode.
Previous endarterectomy 6003 of the RIGHT carotid.

EXAM:
CT ANGIOGRAPHY NECK
TECHNIQUE: Multidetector CT imaging of the neck was performed using the
standard protocol during bolus administration of intravenous
contrast. Multiplanar CT image reconstructions and MIPs were
obtained to evaluate the vascular anatomy. Carotid stenosis
measurements (when applicable) are obtained utilizing NASCET
criteria, using the distal internal carotid diameter as the
denominator.
CONTRAST:  Isovue 300, 60 mL.

[Series 5: [place] · axial · 0.50mm/px · z∈[-282,-133]mm · 6 of 417 slices shown]
[im 60/417  soft-tissue]
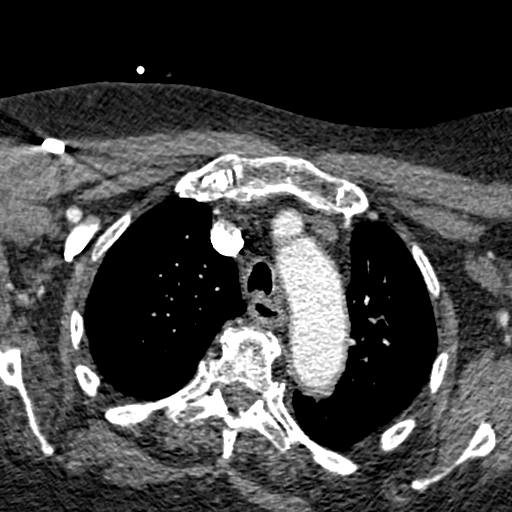
[im 119/417  bone]
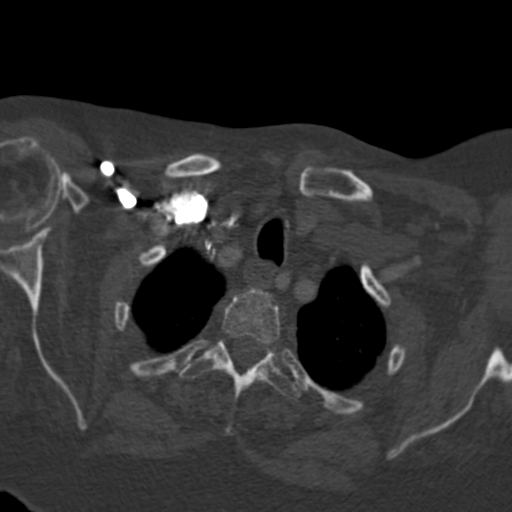
[im 179/417  soft-tissue]
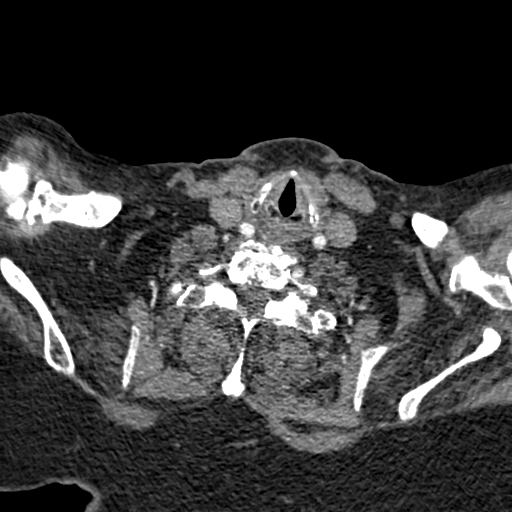
[im 238/417  bone]
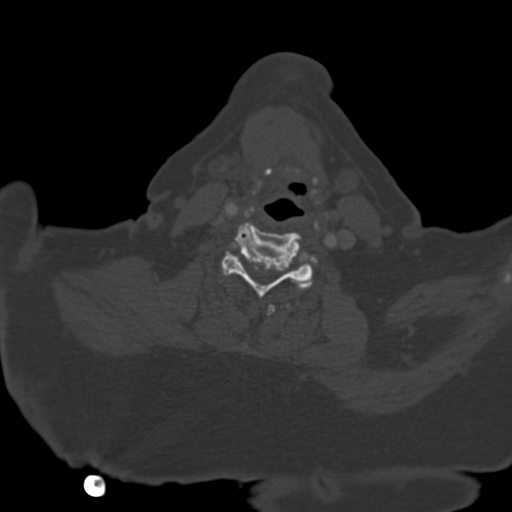
[im 298/417  soft-tissue]
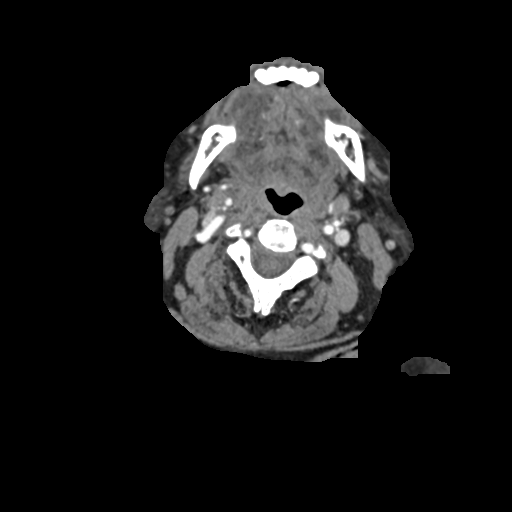
[im 357/417  bone]
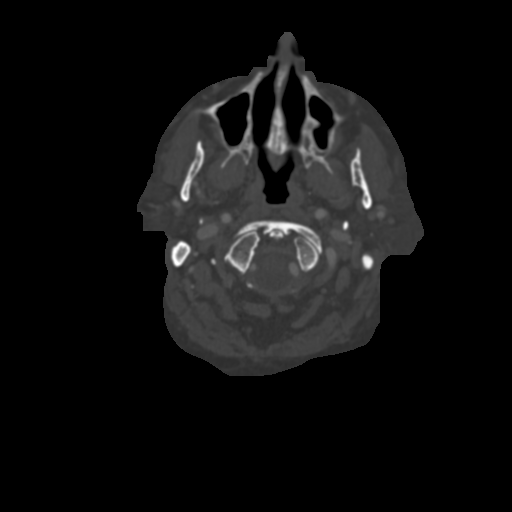

[6 of 34 positions shown; findings below may reference images not displayed]

FINDINGS: Lung apices clear. Mild atheromatous change of the transverse arch.
Conventional branching of the great vessels without proximal
stenosis. LEFT vertebral dominant.

The RIGHT carotid bifurcation is widely patent status post previous
endarterectomy. There is moderate dolichoectasia without residual
stenosis. The RIGHT external carotid is also widely patent.

At the LEFT carotid bifurcation there is significant calcified and
non-calcified plaque beginning at the distal common carotid and
extending into the proximal LEFT internal carotid. Luminal
measurement of 1.1 mm in its most narrow point, compares with a
cervical ICA measurement of 4.7 mm corresponding to a 75-80%
stenosis.

No carotid dissection on the RIGHT or LEFT.

Both vertebral arteries are patent with the LEFT dominant. No ostial
stenosis.

No neck masses. Mucous fills the RIGHT vallecula. Advanced cervical
spondylosis. No visible sinus or mastoid disease.

Review of the MIP images confirms the above findings.
IMPRESSION: 75-80% stenosis at the LEFT carotid bifurcation. There is both
calcific and soft plaque. Good general agreement with prior duplex
carotid ultrasound earlier today. Progression of disease on the LEFT
compared with 6003.

## 2016-06-13 DIAGNOSIS — D519 Vitamin B12 deficiency anemia, unspecified: Secondary | ICD-10-CM | POA: Diagnosis not present

## 2016-06-13 DIAGNOSIS — I10 Essential (primary) hypertension: Secondary | ICD-10-CM | POA: Diagnosis not present

## 2016-06-13 DIAGNOSIS — I6523 Occlusion and stenosis of bilateral carotid arteries: Secondary | ICD-10-CM | POA: Diagnosis not present

## 2016-06-13 DIAGNOSIS — N183 Chronic kidney disease, stage 3 (moderate): Secondary | ICD-10-CM | POA: Diagnosis not present

## 2016-06-13 DIAGNOSIS — Z124 Encounter for screening for malignant neoplasm of cervix: Secondary | ICD-10-CM | POA: Diagnosis not present

## 2016-06-13 DIAGNOSIS — Z0001 Encounter for general adult medical examination with abnormal findings: Secondary | ICD-10-CM | POA: Diagnosis not present

## 2016-06-19 ENCOUNTER — Other Ambulatory Visit (INDEPENDENT_AMBULATORY_CARE_PROVIDER_SITE_OTHER): Payer: Self-pay | Admitting: Vascular Surgery

## 2016-06-19 DIAGNOSIS — I6523 Occlusion and stenosis of bilateral carotid arteries: Secondary | ICD-10-CM

## 2016-06-20 ENCOUNTER — Ambulatory Visit (INDEPENDENT_AMBULATORY_CARE_PROVIDER_SITE_OTHER): Payer: Commercial Managed Care - HMO

## 2016-06-20 ENCOUNTER — Encounter (INDEPENDENT_AMBULATORY_CARE_PROVIDER_SITE_OTHER): Payer: Self-pay | Admitting: Vascular Surgery

## 2016-06-20 ENCOUNTER — Ambulatory Visit (INDEPENDENT_AMBULATORY_CARE_PROVIDER_SITE_OTHER): Payer: Commercial Managed Care - HMO | Admitting: Vascular Surgery

## 2016-06-20 VITALS — BP 197/79 | HR 85 | Resp 16 | Ht 63.0 in | Wt 170.0 lb

## 2016-06-20 DIAGNOSIS — I6523 Occlusion and stenosis of bilateral carotid arteries: Secondary | ICD-10-CM

## 2016-06-20 DIAGNOSIS — E785 Hyperlipidemia, unspecified: Secondary | ICD-10-CM | POA: Diagnosis not present

## 2016-06-20 DIAGNOSIS — I6529 Occlusion and stenosis of unspecified carotid artery: Secondary | ICD-10-CM | POA: Insufficient documentation

## 2016-06-20 NOTE — Assessment & Plan Note (Signed)
She is 8 years status post right carotid endarterectomy for high-grade stenosis. She is doing reasonably well.Her carotid duplex today reveals a widely patent right carotid endarterectomy. Her left carotid stenosis is basically stable in the 60-79% range. She will continue her Plavix and statin agent. Plan to recheck in 6 months.

## 2016-06-20 NOTE — Patient Instructions (Signed)
°Carotid Artery Disease °The carotid arteries are the two main arteries on either side of the neck that supply blood to the brain. Carotid artery disease, also called carotid artery stenosis, is the narrowing or blockage of one or both carotid arteries. Carotid artery disease increases your risk for a stroke or a transient ischemic attack (TIA). A TIA is an episode in which a waxy, fatty substance that accumulates within the artery (plaque) blocks blood flow to the brain. A TIA is considered a "warning stroke."  °CAUSES  °· Buildup of plaque inside the carotid arteries (atherosclerosis) (common). °· A weakened outpouching in an artery (aneurysm). °· Inflammation of the carotid artery (arteritis). °· A fibrous growth within the carotid artery (fibromuscular dysplasia). °· Tissue death within the carotid artery due to radiation treatment (post-radiation necrosis). °· Decreased blood flow due to spasms of the carotid artery (vasospasm). °· Separation of the walls of the carotid artery (carotid dissection). °RISK FACTORS °· High cholesterol (dyslipidemia).   °· High blood pressure (hypertension).   °· Smoking.   °· Obesity.   °· Diabetes.   °· Family history of cardiovascular disease.   °· Inactivity or lack of regular exercise.   °· Being female. Men have an increased risk of developing atherosclerosis earlier in life than women.   °SYMPTOMS  °Carotid artery disease does not cause symptoms. °DIAGNOSIS °Diagnosis of carotid artery disease may include:  °· A physical exam. Your health care provider may hear an abnormal sound (bruit) when listening to the carotid arteries.   °· Specific tests that look at the blood flow in the carotid arteries. These tests include:   °¨ Carotid artery ultrasonography.   °¨ Carotid or cerebral angiography.   °¨ Computerized tomographic angiography (CTA).   °¨ Magnetic resonance angiography (MRA).   °TREATMENT  °Treatment of carotid artery disease can include a combination of treatments.  Treatment options include: °· Surgery. You may have:   °¨ A carotid endarterectomy. This is a surgery to remove the blockages in the carotid arteries.   °¨ A carotid angioplasty with stenting. This is a nonsurgical interventional procedure. A wire mesh (stent) is used to widen the blocked carotid arteries.   °· Medicines to control blood pressure, cholesterol, and reduce blood clotting (antiplatelet therapy).   °· Adjusting your diet.   °· Lifestyle changes such as:   °¨ Quitting smoking.   °¨ Exercising as tolerated or as directed by your health care provider.   °¨ Controlling and maintaining a good blood pressure.   °¨ Keeping cholesterol levels under control.   °HOME CARE INSTRUCTIONS  °· Take medicines only as directed by your health care provider. Make sure you understand all your medicine instructions. Do not stop your medicines without talking to your health care provider.   °· Follow your health care provider's diet instructions. It is important to eat a healthy diet that is low in saturated fats and includes plenty of fresh fruits, vegetables, and lean meats. High-fat, high-sodium foods as well as foods that are fried, overly processed, or have poor nutritional value should be avoided. °· Maintain a healthy weight.   °· Stay physically active. It is recommended that you get at least 30 minutes of activity every day.   °· Do not use any tobacco products including cigarettes, chewing tobacco, or electronic cigarettes. If you need help quitting, ask your health care provider. °· Limit alcohol use to:   °¨ No more than 2 drinks per day for men.   °¨ No more than 1 drink per day for nonpregnant women.   °· Do not use illegal drugs.   °· Keep all follow-up visits as directed by your health   care provider.   °SEEK IMMEDIATE MEDICAL CARE IF:  °You develop TIA or stroke symptoms. These include:  °· Sudden weakness or numbness on one side of the body, such as in the face, arm, or leg.   °· Sudden confusion.    °· Trouble speaking (aphasia) or understanding.   °· Sudden trouble seeing out of one or both eyes.   °· Sudden trouble walking.   °· Dizziness or feeling like you might faint.   °· Loss of balance or coordination.   °· Sudden severe headache with no known cause.   °· Sudden trouble swallowing (dysphagia).   °If you have any of these symptoms, call your local emergency services (911 in [place] Do not drive yourself to the clinic or hospital. This is a medical emergency.  °  °This information is not intended to replace advice given to you by your health care provider. Make sure you discuss any questions you have with your health care provider. °  °Document Released: 10/23/2011 Document Revised: 08/21/2014 Document Reviewed: 01/29/2013 °Elsevier Interactive Patient Education ©2016 [employer] ° °

## 2016-06-20 NOTE — Assessment & Plan Note (Signed)
lipid control important in reducing the progression of atherosclerotic disease. Continue statin therapy  

## 2016-06-20 NOTE — Progress Notes (Signed)
MRN : [id number]  [name] is a 82 y.o. (Aug 19, 1926) female who presents with chief complaint of  Chief Complaint  Patient presents with  . Follow-up  .  History of Present Illness: Patient returns in follow-up of her carotid disease. They have had a death of one of her grandsons and her family and this is clearly bothersome to her. She seems to be doing reasonably well though. She denies focal neurologic symptoms. Specifically, the patient denies amaurosis fugax, speech or swallowing difficulties, or arm or leg weakness or numbness Her carotid duplex today reveals a widely patent right carotid endarterectomy. Her left carotid stenosis is basically stable in the 60-79% range.  Current Outpatient Prescriptions  Medication Sig Dispense Refill  . allopurinol (ZYLOPRIM) 100 MG tablet     . ALPRAZolam (XANAX) 0.25 MG tablet     . amLODipine (NORVASC) 2.5 MG tablet     . benazepril (LOTENSIN) 5 MG tablet     . cloNIDine (CATAPRES - DOSED IN MG/24 HR) 0.2 mg/24hr patch     . clopidogrel (PLAVIX) 75 MG tablet     . omeprazole (PRILOSEC) 20 MG capsule     . pravastatin (PRAVACHOL) 20 MG tablet     . VOLTAREN 1 % GEL      No current facility-administered medications for this visit.     Past Medical History:  Diagnosis Date  . Allergy   . Arthritis   . Hypertension     Past Surgical History:  Procedure Laterality Date  . APPENDECTOMY    . BACK SURGERY    . CAROTID ENDARTERECTOMY Right   . CHOLECYSTECTOMY    . EYE SURGERY Bilateral   . FOOT SURGERY Left   . HERNIA REPAIR    . HIP SURGERY Left   . TONSILLECTOMY      Social History Social History  Substance Use Topics  . Smoking status: Never Smoker  . Smokeless tobacco: Never Used  . Alcohol use No     Family History Family History  Problem Relation Age of Onset  . Congestive Heart Failure Mother   . Heart disease Mother   . Heart disease Father   . Diabetes Brother      Allergies  Allergen Reactions    . Sulfa Antibiotics      REVIEW OF SYSTEMS (Negative unless checked)  Constitutional: [] Weight loss  [] Fever  [] Chills Cardiac: [] Chest pain   [] Chest pressure   [] Palpitations   [] Shortness of breath when laying flat   [] Shortness of breath at rest   [] Shortness of breath with exertion. Vascular:  [] Pain in legs with walking   [] Pain in legs at rest   [] Pain in legs when laying flat   [] Claudication   [] Pain in feet when walking  [] Pain in feet at rest  [] Pain in feet when laying flat   [] History of DVT   [] Phlebitis   [] Swelling in legs   [] Varicose veins   [] Non-healing ulcers Pulmonary:   [] Uses home oxygen   [] Productive cough   [] Hemoptysis   [] Wheeze  [] COPD   [] Asthma Neurologic:  [] Dizziness  [] Blackouts   [] Seizures   [] History of stroke   [] History of TIA  [] Aphasia   [] Temporary blindness   [] Dysphagia   [] Weakness or numbness in arms   [] Weakness or numbness in legs Musculoskeletal:  [x] Arthritis   [] Joint swelling   [x] Joint pain   [] Low back pain Hematologic:  [] Easy bruising  [] Easy bleeding   [] Hypercoagulable state   []   Anemic  [] Hepatitis Gastrointestinal:  [] Blood in stool   [] Vomiting blood  [] Gastroesophageal reflux/heartburn   [] Difficulty swallowing. Genitourinary:  [] Chronic kidney disease   [] Difficult urination  [] Frequent urination  [] Burning with urination   [] Blood in urine Skin:  [] Rashes   [] Ulcers   [] Wounds Psychological:  [] History of anxiety   []  History of major depression.  Physical Examination  Vitals:   06/20/16 0937 06/20/16 0938  BP: (!) 180/72 (!) 197/79  Pulse: 85   Resp: 16   Weight: 170 lb (77.1 kg)   Height: 5\' 3"  (1.6 m)    Body mass index is 30.11 kg/m. Gen:  WD/WN, NAD Head: [place]/AT, No temporalis wasting. Ear/Nose/Throat: Hearing grossly intact, nares w/o erythema or drainage, trachea midline Eyes: Conjunctiva clear. Sclera non-icteric Neck: Supple.  No bruit or JVD.  Pulmonary:  Good air movement, equal and clear to auscultation  bilaterally.  Cardiac: RRR, normal S1, S2, no Murmurs, rubs or gallops. Vascular:  Vessel Right Left  Radial Palpable Palpable  Ulnar Palpable Palpable  Brachial Palpable Palpable  Carotid Palpable, without bruit Palpable, with bruit  Aorta Not palpable N/A  Femoral Palpable Palpable  Popliteal Palpable Palpable  PT Palpable Palpable  DP Palpable Palpable   Gastrointestinal: soft, non-tender/non-distended. No guarding/reflex.  Musculoskeletal: No obvious focal deficits. Walks with a walker. Neurologic: CN 2-12 intact. Sensation grossly intact in extremities.  Symmetrical.  Speech is fluent.  Psychiatric: Judgment intact, Mood & affect appropriate for pt's clinical situation. Dermatologic: No rashes or ulcers noted.  No cellulitis or open wounds. Lymph : No Cervical, Axillary, or Inguinal lymphadenopathy.     CBC Lab Results  Component Value Date   WBC 11.6 (H) 04/07/2014   HGB 10.0 (L) 04/07/2014   HCT 30.7 (L) 04/07/2014   MCV 100 04/07/2014   PLT 214 04/07/2014    BMET    Component Value Date/Time   NA 142 04/07/2014 0409   K 4.0 04/07/2014 0409   CL 107 04/07/2014 0409   CO2 26 04/07/2014 0409   GLUCOSE 87 04/07/2014 0409   BUN 26 (H) 04/07/2014 0409   CREATININE 1.43 (H) 04/07/2014 0409   CALCIUM 8.8 04/07/2014 0409   GFRNONAA 33 (L) 04/07/2014 0409   GFRAA 39 (L) 04/07/2014 0409   CrCl cannot be calculated (Patient's most recent lab result is older than the maximum 21 days allowed.).  COAG Lab Results  Component Value Date   INR 1.0 06/19/2013    Radiology No results found.   Assessment/Plan No problem-specific Assessment & Plan notes found for this encounter.    Leotis Pain, MD  06/20/2016 10:44 AM    This note was created with Dragon medical transcription system.  Any errors from dictation are purely unintentional

## 2016-07-11 DIAGNOSIS — D519 Vitamin B12 deficiency anemia, unspecified: Secondary | ICD-10-CM | POA: Diagnosis not present

## 2016-08-22 DIAGNOSIS — Z0001 Encounter for general adult medical examination with abnormal findings: Secondary | ICD-10-CM | POA: Diagnosis not present

## 2016-08-22 DIAGNOSIS — E1165 Type 2 diabetes mellitus with hyperglycemia: Secondary | ICD-10-CM | POA: Diagnosis not present

## 2016-08-22 DIAGNOSIS — E559 Vitamin D deficiency, unspecified: Secondary | ICD-10-CM | POA: Diagnosis not present

## 2016-08-22 DIAGNOSIS — D509 Iron deficiency anemia, unspecified: Secondary | ICD-10-CM | POA: Diagnosis not present

## 2016-08-22 DIAGNOSIS — D519 Vitamin B12 deficiency anemia, unspecified: Secondary | ICD-10-CM | POA: Diagnosis not present

## 2016-08-22 DIAGNOSIS — I10 Essential (primary) hypertension: Secondary | ICD-10-CM | POA: Diagnosis not present

## 2016-10-10 DIAGNOSIS — E039 Hypothyroidism, unspecified: Secondary | ICD-10-CM | POA: Diagnosis not present

## 2016-10-10 DIAGNOSIS — D519 Vitamin B12 deficiency anemia, unspecified: Secondary | ICD-10-CM | POA: Diagnosis not present

## 2016-10-10 DIAGNOSIS — H60519 Acute actinic otitis externa, unspecified ear: Secondary | ICD-10-CM | POA: Diagnosis not present

## 2016-10-10 DIAGNOSIS — I10 Essential (primary) hypertension: Secondary | ICD-10-CM | POA: Diagnosis not present

## 2016-10-10 DIAGNOSIS — I739 Peripheral vascular disease, unspecified: Secondary | ICD-10-CM | POA: Diagnosis not present

## 2016-10-10 DIAGNOSIS — N183 Chronic kidney disease, stage 3 (moderate): Secondary | ICD-10-CM | POA: Diagnosis not present

## 2016-10-10 DIAGNOSIS — N390 Urinary tract infection, site not specified: Secondary | ICD-10-CM | POA: Diagnosis not present

## 2016-11-07 DIAGNOSIS — D519 Vitamin B12 deficiency anemia, unspecified: Secondary | ICD-10-CM | POA: Diagnosis not present

## 2016-12-05 DIAGNOSIS — D519 Vitamin B12 deficiency anemia, unspecified: Secondary | ICD-10-CM | POA: Diagnosis not present

## 2017-01-02 ENCOUNTER — Ambulatory Visit (INDEPENDENT_AMBULATORY_CARE_PROVIDER_SITE_OTHER): Payer: Medicare HMO

## 2017-01-02 ENCOUNTER — Encounter (INDEPENDENT_AMBULATORY_CARE_PROVIDER_SITE_OTHER): Payer: Self-pay | Admitting: Vascular Surgery

## 2017-01-02 ENCOUNTER — Ambulatory Visit (INDEPENDENT_AMBULATORY_CARE_PROVIDER_SITE_OTHER): Payer: Medicare HMO | Admitting: Vascular Surgery

## 2017-01-02 VITALS — BP 201/85 | HR 81 | Resp 16 | Wt 167.0 lb

## 2017-01-02 DIAGNOSIS — E039 Hypothyroidism, unspecified: Secondary | ICD-10-CM | POA: Diagnosis not present

## 2017-01-02 DIAGNOSIS — I6523 Occlusion and stenosis of bilateral carotid arteries: Secondary | ICD-10-CM | POA: Diagnosis not present

## 2017-01-02 DIAGNOSIS — E785 Hyperlipidemia, unspecified: Secondary | ICD-10-CM

## 2017-01-02 DIAGNOSIS — R5383 Other fatigue: Secondary | ICD-10-CM | POA: Diagnosis not present

## 2017-01-02 DIAGNOSIS — D519 Vitamin B12 deficiency anemia, unspecified: Secondary | ICD-10-CM | POA: Diagnosis not present

## 2017-01-02 DIAGNOSIS — D509 Iron deficiency anemia, unspecified: Secondary | ICD-10-CM | POA: Diagnosis not present

## 2017-01-02 DIAGNOSIS — E875 Hyperkalemia: Secondary | ICD-10-CM | POA: Diagnosis not present

## 2017-01-02 DIAGNOSIS — N183 Chronic kidney disease, stage 3 (moderate): Secondary | ICD-10-CM | POA: Diagnosis not present

## 2017-01-02 DIAGNOSIS — I10 Essential (primary) hypertension: Secondary | ICD-10-CM | POA: Insufficient documentation

## 2017-01-02 LAB — VAS US CAROTID
LCCAPDIAS: 14 cm/s
LCCAPSYS: 83 cm/s
LEFT ECA DIAS: 14 cm/s
LEFT VERTEBRAL DIAS: 22 cm/s
LICAPDIAS: -92 cm/s
Left CCA dist dias: 20 cm/s
Left CCA dist sys: 105 cm/s
Left ICA dist dias: -13 cm/s
Left ICA dist sys: -55 cm/s
Left ICA prox sys: -424 cm/s
RCCAPDIAS: 16 cm/s
RCCAPSYS: 89 cm/s
RIGHT CCA MID DIAS: 21 cm/s
RIGHT ECA DIAS: -6 cm/s
RIGHT VERTEBRAL DIAS: 8 cm/s
Right cca dist sys: 110 cm/s

## 2017-01-02 NOTE — Progress Notes (Signed)
MRN : [id number]  [name] is a 83 y.o. (September 18, 1926) female who presents with chief complaint of  Chief Complaint  Patient presents with  . Follow-up  .  History of Present Illness: Patient returns in follow-up of her carotid disease. She is nearing her 63YW birthday and certainly has a list of issues but continues to walk with a walker. She seems to be doing reasonably well though. She denies focal neurologic symptoms. Specifically, the patient denies amaurosis fugax, speech or swallowing difficulties, or arm or leg weakness or numbness Her carotid duplex today reveals a widely patent right carotid endarterectomy. Her left carotid stenosis is basically stable in the 60-79% range.  Current Outpatient Prescriptions  Medication Sig Dispense Refill  . allopurinol (ZYLOPRIM) 100 MG tablet     . ALPRAZolam (XANAX) 0.25 MG tablet     . amLODipine (NORVASC) 2.5 MG tablet     . benazepril (LOTENSIN) 5 MG tablet     . cloNIDine (CATAPRES - DOSED IN MG/24 HR) 0.2 mg/24hr patch     . clopidogrel (PLAVIX) 75 MG tablet     . omeprazole (PRILOSEC) 20 MG capsule     . pravastatin (PRAVACHOL) 20 MG tablet     . VOLTAREN 1 % GEL      No current facility-administered medications for this visit.         Past Medical History:  Diagnosis Date  . Allergy   . Arthritis   . Hypertension          Past Surgical History:  Procedure Laterality Date  . APPENDECTOMY    . BACK SURGERY    . CAROTID ENDARTERECTOMY Right   . CHOLECYSTECTOMY    . EYE SURGERY Bilateral   . FOOT SURGERY Left   . HERNIA REPAIR    . HIP SURGERY Left   . TONSILLECTOMY      Social History     Social History  Substance Use Topics  . Smoking status: Never Smoker  . Smokeless tobacco: Never Used  . Alcohol use No     Family History      Family History  Problem Relation Age of Onset  . Congestive Heart Failure Mother   . Heart disease Mother   .  Heart disease Father   . Diabetes Brother          Allergies  Allergen Reactions  . Sulfa Antibiotics      REVIEW OF SYSTEMS (Negative unless checked)  Constitutional: [] Weight loss  [] Fever  [] Chills Cardiac: [] Chest pain   [] Chest pressure   [] Palpitations   [] Shortness of breath when laying flat   [] Shortness of breath at rest   [] Shortness of breath with exertion. Vascular:  [] Pain in legs with walking   [] Pain in legs at rest   [] Pain in legs when laying flat   [] Claudication   [] Pain in feet when walking  [] Pain in feet at rest  [] Pain in feet when laying flat   [] History of DVT   [] Phlebitis   [] Swelling in legs   [] Varicose veins   [] Non-healing ulcers Pulmonary:   [] Uses home oxygen   [] Productive cough   [] Hemoptysis   [] Wheeze  [] COPD   [] Asthma Neurologic:  [] Dizziness  [] Blackouts   [] Seizures   [] History of stroke   [] History of TIA  [] Aphasia   [] Temporary blindness   [] Dysphagia   [] Weakness or numbness in arms   [] Weakness or numbness in legs Musculoskeletal:  [x] Arthritis   [] Joint swelling   [  x]Joint pain   [] Low back pain Hematologic:  [] Easy bruising  [] Easy bleeding   [] Hypercoagulable state   [] Anemic  [] Hepatitis Gastrointestinal:  [] Blood in stool   [] Vomiting blood  [] Gastroesophageal reflux/heartburn   [] Difficulty swallowing. Genitourinary:  [] Chronic kidney disease   [] Difficult urination  [] Frequent urination  [] Burning with urination   [] Blood in urine Skin:  [] Rashes   [] Ulcers   [] Wounds Psychological:  [] History of anxiety   []  History of major depression.   Physical Examination  Vitals:   01/02/17 1043 01/02/17 1044  BP: (!) 180/74 (!) 201/85  Pulse: 81   Resp: 16   Weight: 167 lb (75.8 kg)    Body mass index is 29.58 kg/m. Gen:  WD/WN, NAD Head: [address]/AT, No temporalis wasting. Ear/Nose/Throat: Hearing grossly intact, nares w/o erythema or drainage, trachea midline Eyes: Conjunctiva clear. Sclera non-icteric Neck: Supple.  No bruit  or JVD.  Pulmonary:  Good air movement, equal and clear to auscultation bilaterally.  Cardiac: RRR, normal S1, S2, no Murmurs, rubs or gallops. Vascular:  Vessel Right Left  Radial Palpable Palpable  Ulnar Palpable Palpable  Brachial Palpable Palpable  Carotid Palpable, without bruit Palpable, with bruit  Aorta Not palpable N/A  Femoral Palpable Palpable  Popliteal Palpable Palpable  PT Palpable Palpable  DP Palpable Palpable   Gastrointestinal: soft, non-tender/non-distended.  Musculoskeletal: M/S 5/5 throughout.  No deformity or atrophy. Mild LE edema. Walks with a walker Neurologic: CN 2-12 intact. Sensation grossly intact in extremities.  Symmetrical.  Speech is fluent. Motor exam as listed above. Psychiatric: Judgment intact, Mood & affect appropriate for pt's clinical situation. Dermatologic: No rashes or ulcers noted.  No cellulitis or open wounds.      CBC Lab Results  Component Value Date   WBC 11.6 (H) 04/07/2014   HGB 10.0 (L) 04/07/2014   HCT 30.7 (L) 04/07/2014   MCV 100 04/07/2014   PLT 214 04/07/2014    BMET    Component Value Date/Time   NA 142 04/07/2014 0409   K 4.0 04/07/2014 0409   CL 107 04/07/2014 0409   CO2 26 04/07/2014 0409   GLUCOSE 87 04/07/2014 0409   BUN 26 (H) 04/07/2014 0409   CREATININE 1.43 (H) 04/07/2014 0409   CALCIUM 8.8 04/07/2014 0409   GFRNONAA 33 (L) 04/07/2014 0409   GFRAA 39 (L) 04/07/2014 0409   CrCl cannot be calculated (Patient's most recent lab result is older than the maximum 21 days allowed.).  COAG Lab Results  Component Value Date   INR 1.0 06/19/2013    Radiology No results found.    Assessment/Plan Hyperlipidemia lipid control important in reducing the progression of atherosclerotic disease. Continue statin therapy   Essential hypertension, benign blood pressure control important in reducing the progression of atherosclerotic disease. On appropriate oral medications.   Carotid stenosis Her  carotid duplex today reveals a widely patent right carotid endarterectomy. Her left carotid stenosis is basically stable in the 60-79% range. She is doing well with medical management. She will continue her Plavix and statin agent. Plan to recheck again in 6 months.    Leotis Pain, MD  01/02/2017 1:12 PM    This note was created with Dragon medical transcription system.  Any errors from dictation are purely unintentional

## 2017-01-02 NOTE — Assessment & Plan Note (Signed)
lipid control important in reducing the progression of atherosclerotic disease. Continue statin therapy  

## 2017-01-02 NOTE — Assessment & Plan Note (Signed)
Her carotid duplex today reveals a widely patent right carotid endarterectomy. Her left carotid stenosis is basically stable in the 60-79% range. She is doing well with medical management. She will continue her Plavix and statin agent. Plan to recheck again in 6 months.

## 2017-01-02 NOTE — Assessment & Plan Note (Signed)
blood pressure control important in reducing the progression of atherosclerotic disease. On appropriate oral medications.  

## 2017-01-09 DIAGNOSIS — D519 Vitamin B12 deficiency anemia, unspecified: Secondary | ICD-10-CM | POA: Diagnosis not present

## 2017-01-16 DIAGNOSIS — I6523 Occlusion and stenosis of bilateral carotid arteries: Secondary | ICD-10-CM | POA: Diagnosis not present

## 2017-01-16 DIAGNOSIS — N183 Chronic kidney disease, stage 3 (moderate): Secondary | ICD-10-CM | POA: Diagnosis not present

## 2017-01-16 DIAGNOSIS — E039 Hypothyroidism, unspecified: Secondary | ICD-10-CM | POA: Diagnosis not present

## 2017-01-16 DIAGNOSIS — D519 Vitamin B12 deficiency anemia, unspecified: Secondary | ICD-10-CM | POA: Diagnosis not present

## 2017-01-16 DIAGNOSIS — I10 Essential (primary) hypertension: Secondary | ICD-10-CM | POA: Diagnosis not present

## 2017-02-06 DIAGNOSIS — D519 Vitamin B12 deficiency anemia, unspecified: Secondary | ICD-10-CM | POA: Diagnosis not present

## 2017-03-06 DIAGNOSIS — N390 Urinary tract infection, site not specified: Secondary | ICD-10-CM | POA: Diagnosis not present

## 2017-03-06 DIAGNOSIS — I6523 Occlusion and stenosis of bilateral carotid arteries: Secondary | ICD-10-CM | POA: Diagnosis not present

## 2017-03-06 DIAGNOSIS — D519 Vitamin B12 deficiency anemia, unspecified: Secondary | ICD-10-CM | POA: Diagnosis not present

## 2017-03-06 DIAGNOSIS — R262 Difficulty in walking, not elsewhere classified: Secondary | ICD-10-CM | POA: Diagnosis not present

## 2017-03-06 DIAGNOSIS — M25512 Pain in left shoulder: Secondary | ICD-10-CM | POA: Diagnosis not present

## 2017-03-06 DIAGNOSIS — I10 Essential (primary) hypertension: Secondary | ICD-10-CM | POA: Diagnosis not present

## 2017-03-12 DIAGNOSIS — R2689 Other abnormalities of gait and mobility: Secondary | ICD-10-CM | POA: Diagnosis not present

## 2017-03-12 DIAGNOSIS — H60519 Acute actinic otitis externa, unspecified ear: Secondary | ICD-10-CM | POA: Diagnosis not present

## 2017-03-12 DIAGNOSIS — R262 Difficulty in walking, not elsewhere classified: Secondary | ICD-10-CM | POA: Diagnosis not present

## 2017-03-12 DIAGNOSIS — N390 Urinary tract infection, site not specified: Secondary | ICD-10-CM | POA: Diagnosis not present

## 2017-03-12 DIAGNOSIS — I10 Essential (primary) hypertension: Secondary | ICD-10-CM | POA: Diagnosis not present

## 2017-03-12 DIAGNOSIS — I739 Peripheral vascular disease, unspecified: Secondary | ICD-10-CM | POA: Diagnosis not present

## 2017-03-12 DIAGNOSIS — M25512 Pain in left shoulder: Secondary | ICD-10-CM | POA: Diagnosis not present

## 2017-03-12 DIAGNOSIS — M6281 Muscle weakness (generalized): Secondary | ICD-10-CM | POA: Diagnosis not present

## 2017-03-12 DIAGNOSIS — E039 Hypothyroidism, unspecified: Secondary | ICD-10-CM | POA: Diagnosis not present

## 2017-03-13 DIAGNOSIS — I10 Essential (primary) hypertension: Secondary | ICD-10-CM | POA: Diagnosis not present

## 2017-03-13 DIAGNOSIS — R262 Difficulty in walking, not elsewhere classified: Secondary | ICD-10-CM | POA: Diagnosis not present

## 2017-03-13 DIAGNOSIS — I739 Peripheral vascular disease, unspecified: Secondary | ICD-10-CM | POA: Diagnosis not present

## 2017-03-13 DIAGNOSIS — H60519 Acute actinic otitis externa, unspecified ear: Secondary | ICD-10-CM | POA: Diagnosis not present

## 2017-03-13 DIAGNOSIS — E039 Hypothyroidism, unspecified: Secondary | ICD-10-CM | POA: Diagnosis not present

## 2017-03-13 DIAGNOSIS — M25512 Pain in left shoulder: Secondary | ICD-10-CM | POA: Diagnosis not present

## 2017-03-13 DIAGNOSIS — N390 Urinary tract infection, site not specified: Secondary | ICD-10-CM | POA: Diagnosis not present

## 2017-03-13 DIAGNOSIS — R2689 Other abnormalities of gait and mobility: Secondary | ICD-10-CM | POA: Diagnosis not present

## 2017-03-13 DIAGNOSIS — M6281 Muscle weakness (generalized): Secondary | ICD-10-CM | POA: Diagnosis not present

## 2017-03-14 DIAGNOSIS — I10 Essential (primary) hypertension: Secondary | ICD-10-CM | POA: Diagnosis not present

## 2017-03-14 DIAGNOSIS — I739 Peripheral vascular disease, unspecified: Secondary | ICD-10-CM | POA: Diagnosis not present

## 2017-03-14 DIAGNOSIS — N390 Urinary tract infection, site not specified: Secondary | ICD-10-CM | POA: Diagnosis not present

## 2017-03-14 DIAGNOSIS — I6523 Occlusion and stenosis of bilateral carotid arteries: Secondary | ICD-10-CM | POA: Diagnosis not present

## 2017-03-14 DIAGNOSIS — M25512 Pain in left shoulder: Secondary | ICD-10-CM | POA: Diagnosis not present

## 2017-03-14 DIAGNOSIS — R262 Difficulty in walking, not elsewhere classified: Secondary | ICD-10-CM | POA: Diagnosis not present

## 2017-03-14 DIAGNOSIS — B9689 Other specified bacterial agents as the cause of diseases classified elsewhere: Secondary | ICD-10-CM | POA: Diagnosis not present

## 2017-03-14 DIAGNOSIS — D519 Vitamin B12 deficiency anemia, unspecified: Secondary | ICD-10-CM | POA: Diagnosis not present

## 2017-03-14 DIAGNOSIS — E119 Type 2 diabetes mellitus without complications: Secondary | ICD-10-CM | POA: Diagnosis not present

## 2017-03-15 DIAGNOSIS — I10 Essential (primary) hypertension: Secondary | ICD-10-CM | POA: Diagnosis not present

## 2017-03-15 DIAGNOSIS — D519 Vitamin B12 deficiency anemia, unspecified: Secondary | ICD-10-CM | POA: Diagnosis not present

## 2017-03-15 DIAGNOSIS — N390 Urinary tract infection, site not specified: Secondary | ICD-10-CM | POA: Diagnosis not present

## 2017-03-15 DIAGNOSIS — I6523 Occlusion and stenosis of bilateral carotid arteries: Secondary | ICD-10-CM | POA: Diagnosis not present

## 2017-03-15 DIAGNOSIS — E119 Type 2 diabetes mellitus without complications: Secondary | ICD-10-CM | POA: Diagnosis not present

## 2017-03-15 DIAGNOSIS — I739 Peripheral vascular disease, unspecified: Secondary | ICD-10-CM | POA: Diagnosis not present

## 2017-03-15 DIAGNOSIS — B9689 Other specified bacterial agents as the cause of diseases classified elsewhere: Secondary | ICD-10-CM | POA: Diagnosis not present

## 2017-03-15 DIAGNOSIS — R262 Difficulty in walking, not elsewhere classified: Secondary | ICD-10-CM | POA: Diagnosis not present

## 2017-03-15 DIAGNOSIS — M25512 Pain in left shoulder: Secondary | ICD-10-CM | POA: Diagnosis not present

## 2017-03-16 DIAGNOSIS — I739 Peripheral vascular disease, unspecified: Secondary | ICD-10-CM | POA: Diagnosis not present

## 2017-03-16 DIAGNOSIS — E119 Type 2 diabetes mellitus without complications: Secondary | ICD-10-CM | POA: Diagnosis not present

## 2017-03-16 DIAGNOSIS — I6523 Occlusion and stenosis of bilateral carotid arteries: Secondary | ICD-10-CM | POA: Diagnosis not present

## 2017-03-16 DIAGNOSIS — B9689 Other specified bacterial agents as the cause of diseases classified elsewhere: Secondary | ICD-10-CM | POA: Diagnosis not present

## 2017-03-16 DIAGNOSIS — R262 Difficulty in walking, not elsewhere classified: Secondary | ICD-10-CM | POA: Diagnosis not present

## 2017-03-16 DIAGNOSIS — D519 Vitamin B12 deficiency anemia, unspecified: Secondary | ICD-10-CM | POA: Diagnosis not present

## 2017-03-16 DIAGNOSIS — M25512 Pain in left shoulder: Secondary | ICD-10-CM | POA: Diagnosis not present

## 2017-03-16 DIAGNOSIS — N390 Urinary tract infection, site not specified: Secondary | ICD-10-CM | POA: Diagnosis not present

## 2017-03-16 DIAGNOSIS — I10 Essential (primary) hypertension: Secondary | ICD-10-CM | POA: Diagnosis not present

## 2017-03-19 DIAGNOSIS — I739 Peripheral vascular disease, unspecified: Secondary | ICD-10-CM | POA: Diagnosis not present

## 2017-03-19 DIAGNOSIS — I6523 Occlusion and stenosis of bilateral carotid arteries: Secondary | ICD-10-CM | POA: Diagnosis not present

## 2017-03-19 DIAGNOSIS — N390 Urinary tract infection, site not specified: Secondary | ICD-10-CM | POA: Diagnosis not present

## 2017-03-19 DIAGNOSIS — R262 Difficulty in walking, not elsewhere classified: Secondary | ICD-10-CM | POA: Diagnosis not present

## 2017-03-19 DIAGNOSIS — D519 Vitamin B12 deficiency anemia, unspecified: Secondary | ICD-10-CM | POA: Diagnosis not present

## 2017-03-19 DIAGNOSIS — I10 Essential (primary) hypertension: Secondary | ICD-10-CM | POA: Diagnosis not present

## 2017-03-19 DIAGNOSIS — B9689 Other specified bacterial agents as the cause of diseases classified elsewhere: Secondary | ICD-10-CM | POA: Diagnosis not present

## 2017-03-19 DIAGNOSIS — E119 Type 2 diabetes mellitus without complications: Secondary | ICD-10-CM | POA: Diagnosis not present

## 2017-03-19 DIAGNOSIS — M25512 Pain in left shoulder: Secondary | ICD-10-CM | POA: Diagnosis not present

## 2017-03-20 DIAGNOSIS — I6523 Occlusion and stenosis of bilateral carotid arteries: Secondary | ICD-10-CM | POA: Diagnosis not present

## 2017-03-20 DIAGNOSIS — I10 Essential (primary) hypertension: Secondary | ICD-10-CM | POA: Diagnosis not present

## 2017-03-20 DIAGNOSIS — N390 Urinary tract infection, site not specified: Secondary | ICD-10-CM | POA: Diagnosis not present

## 2017-03-20 DIAGNOSIS — E119 Type 2 diabetes mellitus without complications: Secondary | ICD-10-CM | POA: Diagnosis not present

## 2017-03-20 DIAGNOSIS — B9689 Other specified bacterial agents as the cause of diseases classified elsewhere: Secondary | ICD-10-CM | POA: Diagnosis not present

## 2017-03-20 DIAGNOSIS — I739 Peripheral vascular disease, unspecified: Secondary | ICD-10-CM | POA: Diagnosis not present

## 2017-03-20 DIAGNOSIS — M25512 Pain in left shoulder: Secondary | ICD-10-CM | POA: Diagnosis not present

## 2017-03-20 DIAGNOSIS — R262 Difficulty in walking, not elsewhere classified: Secondary | ICD-10-CM | POA: Diagnosis not present

## 2017-03-20 DIAGNOSIS — D519 Vitamin B12 deficiency anemia, unspecified: Secondary | ICD-10-CM | POA: Diagnosis not present

## 2017-03-22 DIAGNOSIS — N390 Urinary tract infection, site not specified: Secondary | ICD-10-CM | POA: Diagnosis not present

## 2017-03-22 DIAGNOSIS — D519 Vitamin B12 deficiency anemia, unspecified: Secondary | ICD-10-CM | POA: Diagnosis not present

## 2017-03-22 DIAGNOSIS — B9689 Other specified bacterial agents as the cause of diseases classified elsewhere: Secondary | ICD-10-CM | POA: Diagnosis not present

## 2017-03-22 DIAGNOSIS — E119 Type 2 diabetes mellitus without complications: Secondary | ICD-10-CM | POA: Diagnosis not present

## 2017-03-22 DIAGNOSIS — R262 Difficulty in walking, not elsewhere classified: Secondary | ICD-10-CM | POA: Diagnosis not present

## 2017-03-22 DIAGNOSIS — I6523 Occlusion and stenosis of bilateral carotid arteries: Secondary | ICD-10-CM | POA: Diagnosis not present

## 2017-03-22 DIAGNOSIS — I10 Essential (primary) hypertension: Secondary | ICD-10-CM | POA: Diagnosis not present

## 2017-03-22 DIAGNOSIS — I739 Peripheral vascular disease, unspecified: Secondary | ICD-10-CM | POA: Diagnosis not present

## 2017-03-22 DIAGNOSIS — M25512 Pain in left shoulder: Secondary | ICD-10-CM | POA: Diagnosis not present

## 2017-03-23 DIAGNOSIS — I739 Peripheral vascular disease, unspecified: Secondary | ICD-10-CM | POA: Diagnosis not present

## 2017-03-23 DIAGNOSIS — N390 Urinary tract infection, site not specified: Secondary | ICD-10-CM | POA: Diagnosis not present

## 2017-03-23 DIAGNOSIS — M25512 Pain in left shoulder: Secondary | ICD-10-CM | POA: Diagnosis not present

## 2017-03-23 DIAGNOSIS — B9689 Other specified bacterial agents as the cause of diseases classified elsewhere: Secondary | ICD-10-CM | POA: Diagnosis not present

## 2017-03-23 DIAGNOSIS — I10 Essential (primary) hypertension: Secondary | ICD-10-CM | POA: Diagnosis not present

## 2017-03-23 DIAGNOSIS — R262 Difficulty in walking, not elsewhere classified: Secondary | ICD-10-CM | POA: Diagnosis not present

## 2017-03-23 DIAGNOSIS — I6523 Occlusion and stenosis of bilateral carotid arteries: Secondary | ICD-10-CM | POA: Diagnosis not present

## 2017-03-23 DIAGNOSIS — E119 Type 2 diabetes mellitus without complications: Secondary | ICD-10-CM | POA: Diagnosis not present

## 2017-03-23 DIAGNOSIS — D519 Vitamin B12 deficiency anemia, unspecified: Secondary | ICD-10-CM | POA: Diagnosis not present

## 2017-03-26 DIAGNOSIS — I739 Peripheral vascular disease, unspecified: Secondary | ICD-10-CM | POA: Diagnosis not present

## 2017-03-26 DIAGNOSIS — I10 Essential (primary) hypertension: Secondary | ICD-10-CM | POA: Diagnosis not present

## 2017-03-26 DIAGNOSIS — M25512 Pain in left shoulder: Secondary | ICD-10-CM | POA: Diagnosis not present

## 2017-03-26 DIAGNOSIS — E119 Type 2 diabetes mellitus without complications: Secondary | ICD-10-CM | POA: Diagnosis not present

## 2017-03-26 DIAGNOSIS — N390 Urinary tract infection, site not specified: Secondary | ICD-10-CM | POA: Diagnosis not present

## 2017-03-26 DIAGNOSIS — D519 Vitamin B12 deficiency anemia, unspecified: Secondary | ICD-10-CM | POA: Diagnosis not present

## 2017-03-26 DIAGNOSIS — R262 Difficulty in walking, not elsewhere classified: Secondary | ICD-10-CM | POA: Diagnosis not present

## 2017-03-26 DIAGNOSIS — B9689 Other specified bacterial agents as the cause of diseases classified elsewhere: Secondary | ICD-10-CM | POA: Diagnosis not present

## 2017-03-26 DIAGNOSIS — I6523 Occlusion and stenosis of bilateral carotid arteries: Secondary | ICD-10-CM | POA: Diagnosis not present

## 2017-03-27 DIAGNOSIS — E119 Type 2 diabetes mellitus without complications: Secondary | ICD-10-CM | POA: Diagnosis not present

## 2017-03-27 DIAGNOSIS — M25512 Pain in left shoulder: Secondary | ICD-10-CM | POA: Diagnosis not present

## 2017-03-27 DIAGNOSIS — I6523 Occlusion and stenosis of bilateral carotid arteries: Secondary | ICD-10-CM | POA: Diagnosis not present

## 2017-03-27 DIAGNOSIS — N390 Urinary tract infection, site not specified: Secondary | ICD-10-CM | POA: Diagnosis not present

## 2017-03-27 DIAGNOSIS — I10 Essential (primary) hypertension: Secondary | ICD-10-CM | POA: Diagnosis not present

## 2017-03-27 DIAGNOSIS — R262 Difficulty in walking, not elsewhere classified: Secondary | ICD-10-CM | POA: Diagnosis not present

## 2017-03-27 DIAGNOSIS — D519 Vitamin B12 deficiency anemia, unspecified: Secondary | ICD-10-CM | POA: Diagnosis not present

## 2017-03-27 DIAGNOSIS — B9689 Other specified bacterial agents as the cause of diseases classified elsewhere: Secondary | ICD-10-CM | POA: Diagnosis not present

## 2017-03-27 DIAGNOSIS — I739 Peripheral vascular disease, unspecified: Secondary | ICD-10-CM | POA: Diagnosis not present

## 2017-03-29 DIAGNOSIS — N390 Urinary tract infection, site not specified: Secondary | ICD-10-CM | POA: Diagnosis not present

## 2017-03-29 DIAGNOSIS — I739 Peripheral vascular disease, unspecified: Secondary | ICD-10-CM | POA: Diagnosis not present

## 2017-03-29 DIAGNOSIS — M25512 Pain in left shoulder: Secondary | ICD-10-CM | POA: Diagnosis not present

## 2017-03-29 DIAGNOSIS — D519 Vitamin B12 deficiency anemia, unspecified: Secondary | ICD-10-CM | POA: Diagnosis not present

## 2017-03-29 DIAGNOSIS — E119 Type 2 diabetes mellitus without complications: Secondary | ICD-10-CM | POA: Diagnosis not present

## 2017-03-29 DIAGNOSIS — I10 Essential (primary) hypertension: Secondary | ICD-10-CM | POA: Diagnosis not present

## 2017-03-29 DIAGNOSIS — I6523 Occlusion and stenosis of bilateral carotid arteries: Secondary | ICD-10-CM | POA: Diagnosis not present

## 2017-03-29 DIAGNOSIS — B9689 Other specified bacterial agents as the cause of diseases classified elsewhere: Secondary | ICD-10-CM | POA: Diagnosis not present

## 2017-03-29 DIAGNOSIS — R262 Difficulty in walking, not elsewhere classified: Secondary | ICD-10-CM | POA: Diagnosis not present

## 2017-03-30 DIAGNOSIS — E119 Type 2 diabetes mellitus without complications: Secondary | ICD-10-CM | POA: Diagnosis not present

## 2017-03-30 DIAGNOSIS — I6523 Occlusion and stenosis of bilateral carotid arteries: Secondary | ICD-10-CM | POA: Diagnosis not present

## 2017-03-30 DIAGNOSIS — I739 Peripheral vascular disease, unspecified: Secondary | ICD-10-CM | POA: Diagnosis not present

## 2017-03-30 DIAGNOSIS — I10 Essential (primary) hypertension: Secondary | ICD-10-CM | POA: Diagnosis not present

## 2017-03-30 DIAGNOSIS — B9689 Other specified bacterial agents as the cause of diseases classified elsewhere: Secondary | ICD-10-CM | POA: Diagnosis not present

## 2017-03-30 DIAGNOSIS — R262 Difficulty in walking, not elsewhere classified: Secondary | ICD-10-CM | POA: Diagnosis not present

## 2017-03-30 DIAGNOSIS — N390 Urinary tract infection, site not specified: Secondary | ICD-10-CM | POA: Diagnosis not present

## 2017-03-30 DIAGNOSIS — M25512 Pain in left shoulder: Secondary | ICD-10-CM | POA: Diagnosis not present

## 2017-03-30 DIAGNOSIS — D519 Vitamin B12 deficiency anemia, unspecified: Secondary | ICD-10-CM | POA: Diagnosis not present

## 2017-04-02 DIAGNOSIS — B9689 Other specified bacterial agents as the cause of diseases classified elsewhere: Secondary | ICD-10-CM | POA: Diagnosis not present

## 2017-04-02 DIAGNOSIS — E119 Type 2 diabetes mellitus without complications: Secondary | ICD-10-CM | POA: Diagnosis not present

## 2017-04-02 DIAGNOSIS — N390 Urinary tract infection, site not specified: Secondary | ICD-10-CM | POA: Diagnosis not present

## 2017-04-02 DIAGNOSIS — I739 Peripheral vascular disease, unspecified: Secondary | ICD-10-CM | POA: Diagnosis not present

## 2017-04-02 DIAGNOSIS — I6523 Occlusion and stenosis of bilateral carotid arteries: Secondary | ICD-10-CM | POA: Diagnosis not present

## 2017-04-02 DIAGNOSIS — I10 Essential (primary) hypertension: Secondary | ICD-10-CM | POA: Diagnosis not present

## 2017-04-02 DIAGNOSIS — R262 Difficulty in walking, not elsewhere classified: Secondary | ICD-10-CM | POA: Diagnosis not present

## 2017-04-02 DIAGNOSIS — D519 Vitamin B12 deficiency anemia, unspecified: Secondary | ICD-10-CM | POA: Diagnosis not present

## 2017-04-02 DIAGNOSIS — M25512 Pain in left shoulder: Secondary | ICD-10-CM | POA: Diagnosis not present

## 2017-04-03 DIAGNOSIS — I6523 Occlusion and stenosis of bilateral carotid arteries: Secondary | ICD-10-CM | POA: Diagnosis not present

## 2017-04-03 DIAGNOSIS — I10 Essential (primary) hypertension: Secondary | ICD-10-CM | POA: Diagnosis not present

## 2017-04-03 DIAGNOSIS — E119 Type 2 diabetes mellitus without complications: Secondary | ICD-10-CM | POA: Diagnosis not present

## 2017-04-03 DIAGNOSIS — B9689 Other specified bacterial agents as the cause of diseases classified elsewhere: Secondary | ICD-10-CM | POA: Diagnosis not present

## 2017-04-03 DIAGNOSIS — R262 Difficulty in walking, not elsewhere classified: Secondary | ICD-10-CM | POA: Diagnosis not present

## 2017-04-03 DIAGNOSIS — N390 Urinary tract infection, site not specified: Secondary | ICD-10-CM | POA: Diagnosis not present

## 2017-04-03 DIAGNOSIS — M25512 Pain in left shoulder: Secondary | ICD-10-CM | POA: Diagnosis not present

## 2017-04-03 DIAGNOSIS — I739 Peripheral vascular disease, unspecified: Secondary | ICD-10-CM | POA: Diagnosis not present

## 2017-04-03 DIAGNOSIS — D519 Vitamin B12 deficiency anemia, unspecified: Secondary | ICD-10-CM | POA: Diagnosis not present

## 2017-04-05 DIAGNOSIS — M25512 Pain in left shoulder: Secondary | ICD-10-CM | POA: Diagnosis not present

## 2017-04-05 DIAGNOSIS — D519 Vitamin B12 deficiency anemia, unspecified: Secondary | ICD-10-CM | POA: Diagnosis not present

## 2017-04-05 DIAGNOSIS — E119 Type 2 diabetes mellitus without complications: Secondary | ICD-10-CM | POA: Diagnosis not present

## 2017-04-05 DIAGNOSIS — R262 Difficulty in walking, not elsewhere classified: Secondary | ICD-10-CM | POA: Diagnosis not present

## 2017-04-05 DIAGNOSIS — I6523 Occlusion and stenosis of bilateral carotid arteries: Secondary | ICD-10-CM | POA: Diagnosis not present

## 2017-04-05 DIAGNOSIS — I739 Peripheral vascular disease, unspecified: Secondary | ICD-10-CM | POA: Diagnosis not present

## 2017-04-05 DIAGNOSIS — I10 Essential (primary) hypertension: Secondary | ICD-10-CM | POA: Diagnosis not present

## 2017-04-05 DIAGNOSIS — B9689 Other specified bacterial agents as the cause of diseases classified elsewhere: Secondary | ICD-10-CM | POA: Diagnosis not present

## 2017-04-05 DIAGNOSIS — N390 Urinary tract infection, site not specified: Secondary | ICD-10-CM | POA: Diagnosis not present

## 2017-04-06 DIAGNOSIS — N390 Urinary tract infection, site not specified: Secondary | ICD-10-CM | POA: Diagnosis not present

## 2017-04-06 DIAGNOSIS — R262 Difficulty in walking, not elsewhere classified: Secondary | ICD-10-CM | POA: Diagnosis not present

## 2017-04-06 DIAGNOSIS — E119 Type 2 diabetes mellitus without complications: Secondary | ICD-10-CM | POA: Diagnosis not present

## 2017-04-06 DIAGNOSIS — B9689 Other specified bacterial agents as the cause of diseases classified elsewhere: Secondary | ICD-10-CM | POA: Diagnosis not present

## 2017-04-06 DIAGNOSIS — M25512 Pain in left shoulder: Secondary | ICD-10-CM | POA: Diagnosis not present

## 2017-04-06 DIAGNOSIS — I739 Peripheral vascular disease, unspecified: Secondary | ICD-10-CM | POA: Diagnosis not present

## 2017-04-06 DIAGNOSIS — D519 Vitamin B12 deficiency anemia, unspecified: Secondary | ICD-10-CM | POA: Diagnosis not present

## 2017-04-06 DIAGNOSIS — I6523 Occlusion and stenosis of bilateral carotid arteries: Secondary | ICD-10-CM | POA: Diagnosis not present

## 2017-04-06 DIAGNOSIS — I10 Essential (primary) hypertension: Secondary | ICD-10-CM | POA: Diagnosis not present

## 2017-04-10 DIAGNOSIS — M25512 Pain in left shoulder: Secondary | ICD-10-CM | POA: Diagnosis not present

## 2017-04-10 DIAGNOSIS — R262 Difficulty in walking, not elsewhere classified: Secondary | ICD-10-CM | POA: Diagnosis not present

## 2017-04-10 DIAGNOSIS — I10 Essential (primary) hypertension: Secondary | ICD-10-CM | POA: Diagnosis not present

## 2017-04-10 DIAGNOSIS — N390 Urinary tract infection, site not specified: Secondary | ICD-10-CM | POA: Diagnosis not present

## 2017-04-10 DIAGNOSIS — I6523 Occlusion and stenosis of bilateral carotid arteries: Secondary | ICD-10-CM | POA: Diagnosis not present

## 2017-04-10 DIAGNOSIS — I739 Peripheral vascular disease, unspecified: Secondary | ICD-10-CM | POA: Diagnosis not present

## 2017-04-10 DIAGNOSIS — E119 Type 2 diabetes mellitus without complications: Secondary | ICD-10-CM | POA: Diagnosis not present

## 2017-04-10 DIAGNOSIS — D519 Vitamin B12 deficiency anemia, unspecified: Secondary | ICD-10-CM | POA: Diagnosis not present

## 2017-04-10 DIAGNOSIS — B9689 Other specified bacterial agents as the cause of diseases classified elsewhere: Secondary | ICD-10-CM | POA: Diagnosis not present

## 2017-04-11 DIAGNOSIS — N390 Urinary tract infection, site not specified: Secondary | ICD-10-CM | POA: Diagnosis not present

## 2017-04-11 DIAGNOSIS — R262 Difficulty in walking, not elsewhere classified: Secondary | ICD-10-CM | POA: Diagnosis not present

## 2017-04-11 DIAGNOSIS — I10 Essential (primary) hypertension: Secondary | ICD-10-CM | POA: Diagnosis not present

## 2017-04-11 DIAGNOSIS — I6523 Occlusion and stenosis of bilateral carotid arteries: Secondary | ICD-10-CM | POA: Diagnosis not present

## 2017-04-11 DIAGNOSIS — M25512 Pain in left shoulder: Secondary | ICD-10-CM | POA: Diagnosis not present

## 2017-04-11 DIAGNOSIS — I739 Peripheral vascular disease, unspecified: Secondary | ICD-10-CM | POA: Diagnosis not present

## 2017-04-11 DIAGNOSIS — D519 Vitamin B12 deficiency anemia, unspecified: Secondary | ICD-10-CM | POA: Diagnosis not present

## 2017-04-11 DIAGNOSIS — E119 Type 2 diabetes mellitus without complications: Secondary | ICD-10-CM | POA: Diagnosis not present

## 2017-04-11 DIAGNOSIS — B9689 Other specified bacterial agents as the cause of diseases classified elsewhere: Secondary | ICD-10-CM | POA: Diagnosis not present

## 2017-04-12 DIAGNOSIS — B9689 Other specified bacterial agents as the cause of diseases classified elsewhere: Secondary | ICD-10-CM | POA: Diagnosis not present

## 2017-04-12 DIAGNOSIS — I10 Essential (primary) hypertension: Secondary | ICD-10-CM | POA: Diagnosis not present

## 2017-04-12 DIAGNOSIS — R262 Difficulty in walking, not elsewhere classified: Secondary | ICD-10-CM | POA: Diagnosis not present

## 2017-04-12 DIAGNOSIS — I739 Peripheral vascular disease, unspecified: Secondary | ICD-10-CM | POA: Diagnosis not present

## 2017-04-12 DIAGNOSIS — D519 Vitamin B12 deficiency anemia, unspecified: Secondary | ICD-10-CM | POA: Diagnosis not present

## 2017-04-12 DIAGNOSIS — E119 Type 2 diabetes mellitus without complications: Secondary | ICD-10-CM | POA: Diagnosis not present

## 2017-04-12 DIAGNOSIS — I6523 Occlusion and stenosis of bilateral carotid arteries: Secondary | ICD-10-CM | POA: Diagnosis not present

## 2017-04-12 DIAGNOSIS — N390 Urinary tract infection, site not specified: Secondary | ICD-10-CM | POA: Diagnosis not present

## 2017-04-12 DIAGNOSIS — M25512 Pain in left shoulder: Secondary | ICD-10-CM | POA: Diagnosis not present

## 2017-04-13 DIAGNOSIS — M25512 Pain in left shoulder: Secondary | ICD-10-CM | POA: Diagnosis not present

## 2017-04-13 DIAGNOSIS — D519 Vitamin B12 deficiency anemia, unspecified: Secondary | ICD-10-CM | POA: Diagnosis not present

## 2017-04-13 DIAGNOSIS — B9689 Other specified bacterial agents as the cause of diseases classified elsewhere: Secondary | ICD-10-CM | POA: Diagnosis not present

## 2017-04-13 DIAGNOSIS — I6523 Occlusion and stenosis of bilateral carotid arteries: Secondary | ICD-10-CM | POA: Diagnosis not present

## 2017-04-13 DIAGNOSIS — I10 Essential (primary) hypertension: Secondary | ICD-10-CM | POA: Diagnosis not present

## 2017-04-13 DIAGNOSIS — R262 Difficulty in walking, not elsewhere classified: Secondary | ICD-10-CM | POA: Diagnosis not present

## 2017-04-13 DIAGNOSIS — N390 Urinary tract infection, site not specified: Secondary | ICD-10-CM | POA: Diagnosis not present

## 2017-04-13 DIAGNOSIS — E119 Type 2 diabetes mellitus without complications: Secondary | ICD-10-CM | POA: Diagnosis not present

## 2017-04-13 DIAGNOSIS — I739 Peripheral vascular disease, unspecified: Secondary | ICD-10-CM | POA: Diagnosis not present

## 2017-04-17 DIAGNOSIS — B9689 Other specified bacterial agents as the cause of diseases classified elsewhere: Secondary | ICD-10-CM | POA: Diagnosis not present

## 2017-04-17 DIAGNOSIS — I739 Peripheral vascular disease, unspecified: Secondary | ICD-10-CM | POA: Diagnosis not present

## 2017-04-17 DIAGNOSIS — I6523 Occlusion and stenosis of bilateral carotid arteries: Secondary | ICD-10-CM | POA: Diagnosis not present

## 2017-04-17 DIAGNOSIS — I10 Essential (primary) hypertension: Secondary | ICD-10-CM | POA: Diagnosis not present

## 2017-04-17 DIAGNOSIS — N390 Urinary tract infection, site not specified: Secondary | ICD-10-CM | POA: Diagnosis not present

## 2017-04-17 DIAGNOSIS — R262 Difficulty in walking, not elsewhere classified: Secondary | ICD-10-CM | POA: Diagnosis not present

## 2017-04-17 DIAGNOSIS — E119 Type 2 diabetes mellitus without complications: Secondary | ICD-10-CM | POA: Diagnosis not present

## 2017-04-17 DIAGNOSIS — M25512 Pain in left shoulder: Secondary | ICD-10-CM | POA: Diagnosis not present

## 2017-04-17 DIAGNOSIS — D519 Vitamin B12 deficiency anemia, unspecified: Secondary | ICD-10-CM | POA: Diagnosis not present

## 2017-04-18 DIAGNOSIS — E119 Type 2 diabetes mellitus without complications: Secondary | ICD-10-CM | POA: Diagnosis not present

## 2017-04-18 DIAGNOSIS — R262 Difficulty in walking, not elsewhere classified: Secondary | ICD-10-CM | POA: Diagnosis not present

## 2017-04-18 DIAGNOSIS — B9689 Other specified bacterial agents as the cause of diseases classified elsewhere: Secondary | ICD-10-CM | POA: Diagnosis not present

## 2017-04-18 DIAGNOSIS — I739 Peripheral vascular disease, unspecified: Secondary | ICD-10-CM | POA: Diagnosis not present

## 2017-04-18 DIAGNOSIS — I6523 Occlusion and stenosis of bilateral carotid arteries: Secondary | ICD-10-CM | POA: Diagnosis not present

## 2017-04-18 DIAGNOSIS — D519 Vitamin B12 deficiency anemia, unspecified: Secondary | ICD-10-CM | POA: Diagnosis not present

## 2017-04-18 DIAGNOSIS — M25512 Pain in left shoulder: Secondary | ICD-10-CM | POA: Diagnosis not present

## 2017-04-18 DIAGNOSIS — I10 Essential (primary) hypertension: Secondary | ICD-10-CM | POA: Diagnosis not present

## 2017-04-18 DIAGNOSIS — N390 Urinary tract infection, site not specified: Secondary | ICD-10-CM | POA: Diagnosis not present

## 2017-04-19 DIAGNOSIS — M25512 Pain in left shoulder: Secondary | ICD-10-CM | POA: Diagnosis not present

## 2017-04-19 DIAGNOSIS — N390 Urinary tract infection, site not specified: Secondary | ICD-10-CM | POA: Diagnosis not present

## 2017-04-19 DIAGNOSIS — E119 Type 2 diabetes mellitus without complications: Secondary | ICD-10-CM | POA: Diagnosis not present

## 2017-04-19 DIAGNOSIS — I739 Peripheral vascular disease, unspecified: Secondary | ICD-10-CM | POA: Diagnosis not present

## 2017-04-19 DIAGNOSIS — B9689 Other specified bacterial agents as the cause of diseases classified elsewhere: Secondary | ICD-10-CM | POA: Diagnosis not present

## 2017-04-19 DIAGNOSIS — I6523 Occlusion and stenosis of bilateral carotid arteries: Secondary | ICD-10-CM | POA: Diagnosis not present

## 2017-04-19 DIAGNOSIS — R262 Difficulty in walking, not elsewhere classified: Secondary | ICD-10-CM | POA: Diagnosis not present

## 2017-04-19 DIAGNOSIS — I10 Essential (primary) hypertension: Secondary | ICD-10-CM | POA: Diagnosis not present

## 2017-04-19 DIAGNOSIS — D519 Vitamin B12 deficiency anemia, unspecified: Secondary | ICD-10-CM | POA: Diagnosis not present

## 2017-04-20 DIAGNOSIS — I10 Essential (primary) hypertension: Secondary | ICD-10-CM | POA: Diagnosis not present

## 2017-04-20 DIAGNOSIS — I6523 Occlusion and stenosis of bilateral carotid arteries: Secondary | ICD-10-CM | POA: Diagnosis not present

## 2017-04-20 DIAGNOSIS — N390 Urinary tract infection, site not specified: Secondary | ICD-10-CM | POA: Diagnosis not present

## 2017-04-20 DIAGNOSIS — E119 Type 2 diabetes mellitus without complications: Secondary | ICD-10-CM | POA: Diagnosis not present

## 2017-04-20 DIAGNOSIS — R262 Difficulty in walking, not elsewhere classified: Secondary | ICD-10-CM | POA: Diagnosis not present

## 2017-04-20 DIAGNOSIS — B9689 Other specified bacterial agents as the cause of diseases classified elsewhere: Secondary | ICD-10-CM | POA: Diagnosis not present

## 2017-04-20 DIAGNOSIS — I739 Peripheral vascular disease, unspecified: Secondary | ICD-10-CM | POA: Diagnosis not present

## 2017-04-20 DIAGNOSIS — M25512 Pain in left shoulder: Secondary | ICD-10-CM | POA: Diagnosis not present

## 2017-04-20 DIAGNOSIS — D519 Vitamin B12 deficiency anemia, unspecified: Secondary | ICD-10-CM | POA: Diagnosis not present

## 2017-04-23 DIAGNOSIS — D519 Vitamin B12 deficiency anemia, unspecified: Secondary | ICD-10-CM | POA: Diagnosis not present

## 2017-04-23 DIAGNOSIS — I739 Peripheral vascular disease, unspecified: Secondary | ICD-10-CM | POA: Diagnosis not present

## 2017-04-23 DIAGNOSIS — I10 Essential (primary) hypertension: Secondary | ICD-10-CM | POA: Diagnosis not present

## 2017-04-23 DIAGNOSIS — B9689 Other specified bacterial agents as the cause of diseases classified elsewhere: Secondary | ICD-10-CM | POA: Diagnosis not present

## 2017-04-23 DIAGNOSIS — N390 Urinary tract infection, site not specified: Secondary | ICD-10-CM | POA: Diagnosis not present

## 2017-04-23 DIAGNOSIS — M25512 Pain in left shoulder: Secondary | ICD-10-CM | POA: Diagnosis not present

## 2017-04-23 DIAGNOSIS — E119 Type 2 diabetes mellitus without complications: Secondary | ICD-10-CM | POA: Diagnosis not present

## 2017-04-23 DIAGNOSIS — I6523 Occlusion and stenosis of bilateral carotid arteries: Secondary | ICD-10-CM | POA: Diagnosis not present

## 2017-04-23 DIAGNOSIS — R262 Difficulty in walking, not elsewhere classified: Secondary | ICD-10-CM | POA: Diagnosis not present

## 2017-04-24 DIAGNOSIS — E119 Type 2 diabetes mellitus without complications: Secondary | ICD-10-CM | POA: Diagnosis not present

## 2017-04-24 DIAGNOSIS — R262 Difficulty in walking, not elsewhere classified: Secondary | ICD-10-CM | POA: Diagnosis not present

## 2017-04-24 DIAGNOSIS — B9689 Other specified bacterial agents as the cause of diseases classified elsewhere: Secondary | ICD-10-CM | POA: Diagnosis not present

## 2017-04-24 DIAGNOSIS — N390 Urinary tract infection, site not specified: Secondary | ICD-10-CM | POA: Diagnosis not present

## 2017-04-24 DIAGNOSIS — D519 Vitamin B12 deficiency anemia, unspecified: Secondary | ICD-10-CM | POA: Diagnosis not present

## 2017-04-24 DIAGNOSIS — I10 Essential (primary) hypertension: Secondary | ICD-10-CM | POA: Diagnosis not present

## 2017-04-24 DIAGNOSIS — I739 Peripheral vascular disease, unspecified: Secondary | ICD-10-CM | POA: Diagnosis not present

## 2017-04-24 DIAGNOSIS — I6523 Occlusion and stenosis of bilateral carotid arteries: Secondary | ICD-10-CM | POA: Diagnosis not present

## 2017-04-24 DIAGNOSIS — M25512 Pain in left shoulder: Secondary | ICD-10-CM | POA: Diagnosis not present

## 2017-05-01 DIAGNOSIS — N390 Urinary tract infection, site not specified: Secondary | ICD-10-CM | POA: Diagnosis not present

## 2017-05-01 DIAGNOSIS — R262 Difficulty in walking, not elsewhere classified: Secondary | ICD-10-CM | POA: Diagnosis not present

## 2017-05-01 DIAGNOSIS — I739 Peripheral vascular disease, unspecified: Secondary | ICD-10-CM | POA: Diagnosis not present

## 2017-05-01 DIAGNOSIS — B9689 Other specified bacterial agents as the cause of diseases classified elsewhere: Secondary | ICD-10-CM | POA: Diagnosis not present

## 2017-05-01 DIAGNOSIS — E119 Type 2 diabetes mellitus without complications: Secondary | ICD-10-CM | POA: Diagnosis not present

## 2017-05-01 DIAGNOSIS — D519 Vitamin B12 deficiency anemia, unspecified: Secondary | ICD-10-CM | POA: Diagnosis not present

## 2017-05-01 DIAGNOSIS — I6523 Occlusion and stenosis of bilateral carotid arteries: Secondary | ICD-10-CM | POA: Diagnosis not present

## 2017-05-01 DIAGNOSIS — M25512 Pain in left shoulder: Secondary | ICD-10-CM | POA: Diagnosis not present

## 2017-05-01 DIAGNOSIS — I10 Essential (primary) hypertension: Secondary | ICD-10-CM | POA: Diagnosis not present

## 2017-05-03 DIAGNOSIS — I10 Essential (primary) hypertension: Secondary | ICD-10-CM | POA: Diagnosis not present

## 2017-05-03 DIAGNOSIS — D519 Vitamin B12 deficiency anemia, unspecified: Secondary | ICD-10-CM | POA: Diagnosis not present

## 2017-05-03 DIAGNOSIS — E119 Type 2 diabetes mellitus without complications: Secondary | ICD-10-CM | POA: Diagnosis not present

## 2017-05-03 DIAGNOSIS — M25512 Pain in left shoulder: Secondary | ICD-10-CM | POA: Diagnosis not present

## 2017-05-03 DIAGNOSIS — I6523 Occlusion and stenosis of bilateral carotid arteries: Secondary | ICD-10-CM | POA: Diagnosis not present

## 2017-05-03 DIAGNOSIS — I739 Peripheral vascular disease, unspecified: Secondary | ICD-10-CM | POA: Diagnosis not present

## 2017-05-03 DIAGNOSIS — B9689 Other specified bacterial agents as the cause of diseases classified elsewhere: Secondary | ICD-10-CM | POA: Diagnosis not present

## 2017-05-03 DIAGNOSIS — R262 Difficulty in walking, not elsewhere classified: Secondary | ICD-10-CM | POA: Diagnosis not present

## 2017-05-03 DIAGNOSIS — N390 Urinary tract infection, site not specified: Secondary | ICD-10-CM | POA: Diagnosis not present

## 2017-05-04 DIAGNOSIS — E119 Type 2 diabetes mellitus without complications: Secondary | ICD-10-CM | POA: Diagnosis not present

## 2017-05-04 DIAGNOSIS — M25512 Pain in left shoulder: Secondary | ICD-10-CM | POA: Diagnosis not present

## 2017-05-04 DIAGNOSIS — I6523 Occlusion and stenosis of bilateral carotid arteries: Secondary | ICD-10-CM | POA: Diagnosis not present

## 2017-05-04 DIAGNOSIS — R262 Difficulty in walking, not elsewhere classified: Secondary | ICD-10-CM | POA: Diagnosis not present

## 2017-05-04 DIAGNOSIS — D519 Vitamin B12 deficiency anemia, unspecified: Secondary | ICD-10-CM | POA: Diagnosis not present

## 2017-05-04 DIAGNOSIS — I739 Peripheral vascular disease, unspecified: Secondary | ICD-10-CM | POA: Diagnosis not present

## 2017-05-04 DIAGNOSIS — I10 Essential (primary) hypertension: Secondary | ICD-10-CM | POA: Diagnosis not present

## 2017-05-04 DIAGNOSIS — N390 Urinary tract infection, site not specified: Secondary | ICD-10-CM | POA: Diagnosis not present

## 2017-05-04 DIAGNOSIS — B9689 Other specified bacterial agents as the cause of diseases classified elsewhere: Secondary | ICD-10-CM | POA: Diagnosis not present

## 2017-05-07 DIAGNOSIS — D519 Vitamin B12 deficiency anemia, unspecified: Secondary | ICD-10-CM | POA: Diagnosis not present

## 2017-05-07 DIAGNOSIS — I6523 Occlusion and stenosis of bilateral carotid arteries: Secondary | ICD-10-CM | POA: Diagnosis not present

## 2017-05-07 DIAGNOSIS — N390 Urinary tract infection, site not specified: Secondary | ICD-10-CM | POA: Diagnosis not present

## 2017-05-07 DIAGNOSIS — M25512 Pain in left shoulder: Secondary | ICD-10-CM | POA: Diagnosis not present

## 2017-05-07 DIAGNOSIS — R262 Difficulty in walking, not elsewhere classified: Secondary | ICD-10-CM | POA: Diagnosis not present

## 2017-05-07 DIAGNOSIS — E119 Type 2 diabetes mellitus without complications: Secondary | ICD-10-CM | POA: Diagnosis not present

## 2017-05-07 DIAGNOSIS — I739 Peripheral vascular disease, unspecified: Secondary | ICD-10-CM | POA: Diagnosis not present

## 2017-05-07 DIAGNOSIS — B9689 Other specified bacterial agents as the cause of diseases classified elsewhere: Secondary | ICD-10-CM | POA: Diagnosis not present

## 2017-05-07 DIAGNOSIS — I10 Essential (primary) hypertension: Secondary | ICD-10-CM | POA: Diagnosis not present

## 2017-05-08 DIAGNOSIS — N390 Urinary tract infection, site not specified: Secondary | ICD-10-CM | POA: Diagnosis not present

## 2017-05-08 DIAGNOSIS — E119 Type 2 diabetes mellitus without complications: Secondary | ICD-10-CM | POA: Diagnosis not present

## 2017-05-08 DIAGNOSIS — I739 Peripheral vascular disease, unspecified: Secondary | ICD-10-CM | POA: Diagnosis not present

## 2017-05-08 DIAGNOSIS — M25512 Pain in left shoulder: Secondary | ICD-10-CM | POA: Diagnosis not present

## 2017-05-08 DIAGNOSIS — D519 Vitamin B12 deficiency anemia, unspecified: Secondary | ICD-10-CM | POA: Diagnosis not present

## 2017-05-08 DIAGNOSIS — R262 Difficulty in walking, not elsewhere classified: Secondary | ICD-10-CM | POA: Diagnosis not present

## 2017-05-08 DIAGNOSIS — B9689 Other specified bacterial agents as the cause of diseases classified elsewhere: Secondary | ICD-10-CM | POA: Diagnosis not present

## 2017-05-08 DIAGNOSIS — I10 Essential (primary) hypertension: Secondary | ICD-10-CM | POA: Diagnosis not present

## 2017-05-08 DIAGNOSIS — I6523 Occlusion and stenosis of bilateral carotid arteries: Secondary | ICD-10-CM | POA: Diagnosis not present

## 2017-05-10 DIAGNOSIS — I6523 Occlusion and stenosis of bilateral carotid arteries: Secondary | ICD-10-CM | POA: Diagnosis not present

## 2017-05-10 DIAGNOSIS — R262 Difficulty in walking, not elsewhere classified: Secondary | ICD-10-CM | POA: Diagnosis not present

## 2017-05-10 DIAGNOSIS — B9689 Other specified bacterial agents as the cause of diseases classified elsewhere: Secondary | ICD-10-CM | POA: Diagnosis not present

## 2017-05-10 DIAGNOSIS — N390 Urinary tract infection, site not specified: Secondary | ICD-10-CM | POA: Diagnosis not present

## 2017-05-10 DIAGNOSIS — M25512 Pain in left shoulder: Secondary | ICD-10-CM | POA: Diagnosis not present

## 2017-05-10 DIAGNOSIS — I739 Peripheral vascular disease, unspecified: Secondary | ICD-10-CM | POA: Diagnosis not present

## 2017-05-10 DIAGNOSIS — I10 Essential (primary) hypertension: Secondary | ICD-10-CM | POA: Diagnosis not present

## 2017-05-10 DIAGNOSIS — D519 Vitamin B12 deficiency anemia, unspecified: Secondary | ICD-10-CM | POA: Diagnosis not present

## 2017-05-10 DIAGNOSIS — E119 Type 2 diabetes mellitus without complications: Secondary | ICD-10-CM | POA: Diagnosis not present

## 2017-05-16 DIAGNOSIS — E119 Type 2 diabetes mellitus without complications: Secondary | ICD-10-CM | POA: Diagnosis not present

## 2017-05-16 DIAGNOSIS — I739 Peripheral vascular disease, unspecified: Secondary | ICD-10-CM | POA: Diagnosis not present

## 2017-05-16 DIAGNOSIS — M6281 Muscle weakness (generalized): Secondary | ICD-10-CM | POA: Diagnosis not present

## 2017-05-16 DIAGNOSIS — I6523 Occlusion and stenosis of bilateral carotid arteries: Secondary | ICD-10-CM | POA: Diagnosis not present

## 2017-05-16 DIAGNOSIS — D519 Vitamin B12 deficiency anemia, unspecified: Secondary | ICD-10-CM | POA: Diagnosis not present

## 2017-05-16 DIAGNOSIS — I10 Essential (primary) hypertension: Secondary | ICD-10-CM | POA: Diagnosis not present

## 2017-05-16 DIAGNOSIS — Z87440 Personal history of urinary (tract) infections: Secondary | ICD-10-CM | POA: Diagnosis not present

## 2017-05-18 DIAGNOSIS — I739 Peripheral vascular disease, unspecified: Secondary | ICD-10-CM | POA: Diagnosis not present

## 2017-05-18 DIAGNOSIS — I10 Essential (primary) hypertension: Secondary | ICD-10-CM | POA: Diagnosis not present

## 2017-05-18 DIAGNOSIS — I6523 Occlusion and stenosis of bilateral carotid arteries: Secondary | ICD-10-CM | POA: Diagnosis not present

## 2017-05-18 DIAGNOSIS — E119 Type 2 diabetes mellitus without complications: Secondary | ICD-10-CM | POA: Diagnosis not present

## 2017-05-18 DIAGNOSIS — D519 Vitamin B12 deficiency anemia, unspecified: Secondary | ICD-10-CM | POA: Diagnosis not present

## 2017-05-18 DIAGNOSIS — Z87440 Personal history of urinary (tract) infections: Secondary | ICD-10-CM | POA: Diagnosis not present

## 2017-05-18 DIAGNOSIS — M6281 Muscle weakness (generalized): Secondary | ICD-10-CM | POA: Diagnosis not present

## 2017-05-21 DIAGNOSIS — E119 Type 2 diabetes mellitus without complications: Secondary | ICD-10-CM | POA: Diagnosis not present

## 2017-05-21 DIAGNOSIS — Z87440 Personal history of urinary (tract) infections: Secondary | ICD-10-CM | POA: Diagnosis not present

## 2017-05-21 DIAGNOSIS — D519 Vitamin B12 deficiency anemia, unspecified: Secondary | ICD-10-CM | POA: Diagnosis not present

## 2017-05-21 DIAGNOSIS — M6281 Muscle weakness (generalized): Secondary | ICD-10-CM | POA: Diagnosis not present

## 2017-05-21 DIAGNOSIS — I739 Peripheral vascular disease, unspecified: Secondary | ICD-10-CM | POA: Diagnosis not present

## 2017-05-21 DIAGNOSIS — I6523 Occlusion and stenosis of bilateral carotid arteries: Secondary | ICD-10-CM | POA: Diagnosis not present

## 2017-05-21 DIAGNOSIS — I10 Essential (primary) hypertension: Secondary | ICD-10-CM | POA: Diagnosis not present

## 2017-05-22 DIAGNOSIS — D519 Vitamin B12 deficiency anemia, unspecified: Secondary | ICD-10-CM | POA: Diagnosis not present

## 2017-05-22 DIAGNOSIS — N183 Chronic kidney disease, stage 3 (moderate): Secondary | ICD-10-CM | POA: Diagnosis not present

## 2017-05-22 DIAGNOSIS — I10 Essential (primary) hypertension: Secondary | ICD-10-CM | POA: Diagnosis not present

## 2017-05-22 DIAGNOSIS — I6523 Occlusion and stenosis of bilateral carotid arteries: Secondary | ICD-10-CM | POA: Diagnosis not present

## 2017-05-22 DIAGNOSIS — E039 Hypothyroidism, unspecified: Secondary | ICD-10-CM | POA: Diagnosis not present

## 2017-05-23 DIAGNOSIS — I10 Essential (primary) hypertension: Secondary | ICD-10-CM | POA: Diagnosis not present

## 2017-05-23 DIAGNOSIS — I6523 Occlusion and stenosis of bilateral carotid arteries: Secondary | ICD-10-CM | POA: Diagnosis not present

## 2017-05-23 DIAGNOSIS — M6281 Muscle weakness (generalized): Secondary | ICD-10-CM | POA: Diagnosis not present

## 2017-05-23 DIAGNOSIS — Z87440 Personal history of urinary (tract) infections: Secondary | ICD-10-CM | POA: Diagnosis not present

## 2017-05-23 DIAGNOSIS — I739 Peripheral vascular disease, unspecified: Secondary | ICD-10-CM | POA: Diagnosis not present

## 2017-05-23 DIAGNOSIS — E119 Type 2 diabetes mellitus without complications: Secondary | ICD-10-CM | POA: Diagnosis not present

## 2017-05-23 DIAGNOSIS — D519 Vitamin B12 deficiency anemia, unspecified: Secondary | ICD-10-CM | POA: Diagnosis not present

## 2017-05-24 DIAGNOSIS — I10 Essential (primary) hypertension: Secondary | ICD-10-CM | POA: Diagnosis not present

## 2017-05-24 DIAGNOSIS — Z87440 Personal history of urinary (tract) infections: Secondary | ICD-10-CM | POA: Diagnosis not present

## 2017-05-24 DIAGNOSIS — I6523 Occlusion and stenosis of bilateral carotid arteries: Secondary | ICD-10-CM | POA: Diagnosis not present

## 2017-05-24 DIAGNOSIS — M6281 Muscle weakness (generalized): Secondary | ICD-10-CM | POA: Diagnosis not present

## 2017-05-24 DIAGNOSIS — I739 Peripheral vascular disease, unspecified: Secondary | ICD-10-CM | POA: Diagnosis not present

## 2017-05-24 DIAGNOSIS — D519 Vitamin B12 deficiency anemia, unspecified: Secondary | ICD-10-CM | POA: Diagnosis not present

## 2017-05-24 DIAGNOSIS — E119 Type 2 diabetes mellitus without complications: Secondary | ICD-10-CM | POA: Diagnosis not present

## 2017-06-01 DIAGNOSIS — I739 Peripheral vascular disease, unspecified: Secondary | ICD-10-CM | POA: Diagnosis not present

## 2017-06-01 DIAGNOSIS — Z87440 Personal history of urinary (tract) infections: Secondary | ICD-10-CM | POA: Diagnosis not present

## 2017-06-01 DIAGNOSIS — E119 Type 2 diabetes mellitus without complications: Secondary | ICD-10-CM | POA: Diagnosis not present

## 2017-06-01 DIAGNOSIS — I6523 Occlusion and stenosis of bilateral carotid arteries: Secondary | ICD-10-CM | POA: Diagnosis not present

## 2017-06-01 DIAGNOSIS — D519 Vitamin B12 deficiency anemia, unspecified: Secondary | ICD-10-CM | POA: Diagnosis not present

## 2017-06-01 DIAGNOSIS — I10 Essential (primary) hypertension: Secondary | ICD-10-CM | POA: Diagnosis not present

## 2017-06-01 DIAGNOSIS — M6281 Muscle weakness (generalized): Secondary | ICD-10-CM | POA: Diagnosis not present

## 2017-06-07 DIAGNOSIS — D519 Vitamin B12 deficiency anemia, unspecified: Secondary | ICD-10-CM | POA: Diagnosis not present

## 2017-06-07 DIAGNOSIS — Z87440 Personal history of urinary (tract) infections: Secondary | ICD-10-CM | POA: Diagnosis not present

## 2017-06-07 DIAGNOSIS — M6281 Muscle weakness (generalized): Secondary | ICD-10-CM | POA: Diagnosis not present

## 2017-06-07 DIAGNOSIS — I10 Essential (primary) hypertension: Secondary | ICD-10-CM | POA: Diagnosis not present

## 2017-06-07 DIAGNOSIS — I6523 Occlusion and stenosis of bilateral carotid arteries: Secondary | ICD-10-CM | POA: Diagnosis not present

## 2017-06-07 DIAGNOSIS — I739 Peripheral vascular disease, unspecified: Secondary | ICD-10-CM | POA: Diagnosis not present

## 2017-06-07 DIAGNOSIS — E119 Type 2 diabetes mellitus without complications: Secondary | ICD-10-CM | POA: Diagnosis not present

## 2017-06-08 DIAGNOSIS — Z87440 Personal history of urinary (tract) infections: Secondary | ICD-10-CM | POA: Diagnosis not present

## 2017-06-08 DIAGNOSIS — M6281 Muscle weakness (generalized): Secondary | ICD-10-CM | POA: Diagnosis not present

## 2017-06-08 DIAGNOSIS — I739 Peripheral vascular disease, unspecified: Secondary | ICD-10-CM | POA: Diagnosis not present

## 2017-06-08 DIAGNOSIS — E119 Type 2 diabetes mellitus without complications: Secondary | ICD-10-CM | POA: Diagnosis not present

## 2017-06-08 DIAGNOSIS — D519 Vitamin B12 deficiency anemia, unspecified: Secondary | ICD-10-CM | POA: Diagnosis not present

## 2017-06-08 DIAGNOSIS — I6523 Occlusion and stenosis of bilateral carotid arteries: Secondary | ICD-10-CM | POA: Diagnosis not present

## 2017-06-08 DIAGNOSIS — I10 Essential (primary) hypertension: Secondary | ICD-10-CM | POA: Diagnosis not present

## 2017-06-15 DIAGNOSIS — I10 Essential (primary) hypertension: Secondary | ICD-10-CM | POA: Diagnosis not present

## 2017-06-15 DIAGNOSIS — M6281 Muscle weakness (generalized): Secondary | ICD-10-CM | POA: Diagnosis not present

## 2017-06-15 DIAGNOSIS — I6523 Occlusion and stenosis of bilateral carotid arteries: Secondary | ICD-10-CM | POA: Diagnosis not present

## 2017-06-15 DIAGNOSIS — I739 Peripheral vascular disease, unspecified: Secondary | ICD-10-CM | POA: Diagnosis not present

## 2017-06-15 DIAGNOSIS — D519 Vitamin B12 deficiency anemia, unspecified: Secondary | ICD-10-CM | POA: Diagnosis not present

## 2017-06-15 DIAGNOSIS — E119 Type 2 diabetes mellitus without complications: Secondary | ICD-10-CM | POA: Diagnosis not present

## 2017-06-15 DIAGNOSIS — Z87440 Personal history of urinary (tract) infections: Secondary | ICD-10-CM | POA: Diagnosis not present

## 2017-06-22 DIAGNOSIS — I739 Peripheral vascular disease, unspecified: Secondary | ICD-10-CM | POA: Diagnosis not present

## 2017-06-22 DIAGNOSIS — D519 Vitamin B12 deficiency anemia, unspecified: Secondary | ICD-10-CM | POA: Diagnosis not present

## 2017-06-22 DIAGNOSIS — I10 Essential (primary) hypertension: Secondary | ICD-10-CM | POA: Diagnosis not present

## 2017-06-22 DIAGNOSIS — E119 Type 2 diabetes mellitus without complications: Secondary | ICD-10-CM | POA: Diagnosis not present

## 2017-06-22 DIAGNOSIS — Z87440 Personal history of urinary (tract) infections: Secondary | ICD-10-CM | POA: Diagnosis not present

## 2017-06-22 DIAGNOSIS — M6281 Muscle weakness (generalized): Secondary | ICD-10-CM | POA: Diagnosis not present

## 2017-06-22 DIAGNOSIS — I6523 Occlusion and stenosis of bilateral carotid arteries: Secondary | ICD-10-CM | POA: Diagnosis not present

## 2017-06-29 DIAGNOSIS — D519 Vitamin B12 deficiency anemia, unspecified: Secondary | ICD-10-CM | POA: Diagnosis not present

## 2017-06-29 DIAGNOSIS — M6281 Muscle weakness (generalized): Secondary | ICD-10-CM | POA: Diagnosis not present

## 2017-06-29 DIAGNOSIS — E119 Type 2 diabetes mellitus without complications: Secondary | ICD-10-CM | POA: Diagnosis not present

## 2017-06-29 DIAGNOSIS — I10 Essential (primary) hypertension: Secondary | ICD-10-CM | POA: Diagnosis not present

## 2017-06-29 DIAGNOSIS — I6523 Occlusion and stenosis of bilateral carotid arteries: Secondary | ICD-10-CM | POA: Diagnosis not present

## 2017-06-29 DIAGNOSIS — I739 Peripheral vascular disease, unspecified: Secondary | ICD-10-CM | POA: Diagnosis not present

## 2017-06-29 DIAGNOSIS — Z87440 Personal history of urinary (tract) infections: Secondary | ICD-10-CM | POA: Diagnosis not present

## 2017-07-06 DIAGNOSIS — I6523 Occlusion and stenosis of bilateral carotid arteries: Secondary | ICD-10-CM | POA: Diagnosis not present

## 2017-07-06 DIAGNOSIS — I10 Essential (primary) hypertension: Secondary | ICD-10-CM | POA: Diagnosis not present

## 2017-07-06 DIAGNOSIS — I739 Peripheral vascular disease, unspecified: Secondary | ICD-10-CM | POA: Diagnosis not present

## 2017-07-06 DIAGNOSIS — Z87440 Personal history of urinary (tract) infections: Secondary | ICD-10-CM | POA: Diagnosis not present

## 2017-07-06 DIAGNOSIS — E119 Type 2 diabetes mellitus without complications: Secondary | ICD-10-CM | POA: Diagnosis not present

## 2017-07-06 DIAGNOSIS — M6281 Muscle weakness (generalized): Secondary | ICD-10-CM | POA: Diagnosis not present

## 2017-07-06 DIAGNOSIS — D519 Vitamin B12 deficiency anemia, unspecified: Secondary | ICD-10-CM | POA: Diagnosis not present

## 2017-07-09 DIAGNOSIS — D519 Vitamin B12 deficiency anemia, unspecified: Secondary | ICD-10-CM | POA: Diagnosis not present

## 2017-07-09 DIAGNOSIS — I739 Peripheral vascular disease, unspecified: Secondary | ICD-10-CM | POA: Diagnosis not present

## 2017-07-09 DIAGNOSIS — I6523 Occlusion and stenosis of bilateral carotid arteries: Secondary | ICD-10-CM | POA: Diagnosis not present

## 2017-07-09 DIAGNOSIS — E119 Type 2 diabetes mellitus without complications: Secondary | ICD-10-CM | POA: Diagnosis not present

## 2017-07-09 DIAGNOSIS — M6281 Muscle weakness (generalized): Secondary | ICD-10-CM | POA: Diagnosis not present

## 2017-07-09 DIAGNOSIS — I10 Essential (primary) hypertension: Secondary | ICD-10-CM | POA: Diagnosis not present

## 2017-07-09 DIAGNOSIS — Z87440 Personal history of urinary (tract) infections: Secondary | ICD-10-CM | POA: Diagnosis not present

## 2017-07-10 ENCOUNTER — Encounter (INDEPENDENT_AMBULATORY_CARE_PROVIDER_SITE_OTHER): Payer: Self-pay

## 2017-07-10 ENCOUNTER — Ambulatory Visit (INDEPENDENT_AMBULATORY_CARE_PROVIDER_SITE_OTHER): Payer: Medicare HMO

## 2017-07-10 ENCOUNTER — Encounter (INDEPENDENT_AMBULATORY_CARE_PROVIDER_SITE_OTHER): Payer: Self-pay | Admitting: Vascular Surgery

## 2017-07-10 ENCOUNTER — Ambulatory Visit (INDEPENDENT_AMBULATORY_CARE_PROVIDER_SITE_OTHER): Payer: Medicare HMO | Admitting: Vascular Surgery

## 2017-07-10 VITALS — BP 184/84 | HR 80 | Resp 17 | Wt 169.0 lb

## 2017-07-10 DIAGNOSIS — I10 Essential (primary) hypertension: Secondary | ICD-10-CM

## 2017-07-10 DIAGNOSIS — I6523 Occlusion and stenosis of bilateral carotid arteries: Secondary | ICD-10-CM

## 2017-07-10 DIAGNOSIS — E785 Hyperlipidemia, unspecified: Secondary | ICD-10-CM

## 2017-07-10 NOTE — Patient Instructions (Signed)
Carotid Artery Disease The carotid arteries are arteries on both sides of the neck. They carry blood to the brain. Carotid artery disease is when the arteries get smaller (narrow) or get blocked. If these arteries get smaller or get blocked, you are more likely to have a stroke or warning stroke (transient ischemic attack). Follow these instructions at home:  Take medicines as told by your doctor. Make sure you understand all your medicine instructions. Do not stop your medicines without talking to your doctor first.  Follow your doctor's diet instructions. It is important to eat a healthy diet that includes plenty of: ? Fresh fruits. ? Vegetables. ? Lean meats.  Avoid: ? High-fat foods. ? High-sodium foods. ? Foods that are fried, overly processed, or have poor nutritional value.  Stay a healthy weight.  Stay active. Get at least 30 minutes of activity every day.  Do not smoke.  Limit alcohol use to: ? No more than 2 drinks a day for men. ? No more than 1 drink a day for women who are not pregnant.  Do not use illegal drugs.  Keep all doctor visits as told. Get help right away if:  You have sudden weakness or loss of feeling (numbness) on one side of the body, such as the face, arm, or leg.  You have sudden confusion.  You have trouble speaking (aphasia) or understanding.  You have sudden trouble seeing out of one or both eyes.  You have sudden trouble walking.  You have dizziness or feel like you might pass out (faint).  You have a loss of balance or your movements are not steady (uncoordinated).  You have a sudden, severe headache with no known cause.  You have trouble swallowing (dysphagia). Call your local emergency services (911 in [place] Do notdrive yourself to the clinic or hospital. This information is not intended to replace advice given to you by your health care provider. Make sure you discuss any questions you have with your health care  provider. Document Released: 07/17/2012 Document Revised: 01/06/2016 Document Reviewed: 01/29/2013 Elsevier Interactive Patient Education  2018 [employer].  

## 2017-07-10 NOTE — Progress Notes (Signed)
MRN : [id number]  [name] is a 83 y.o. (1927-01-31) female who presents with chief complaint of  Chief Complaint  Patient presents with  . Carotid    48mth follow up  .  History of Present Illness: Patient returns in follow-up of her carotid disease.  No new complaints or problems since her last visit.  No new focal neurologic symptoms.  She is almost 10 years status post right carotid artery.  Her duplex today shows a widely patent right carotid endarterectomy.  Her left carotid artery stenosis remains in the 60-79% range although likely in the higher end of that range.  Current Outpatient Prescriptions  Medication Sig Dispense Refill  . allopurinol (ZYLOPRIM) 100 MG tablet     . ALPRAZolam (XANAX) 0.25 MG tablet     . amLODipine (NORVASC) 2.5 MG tablet     . benazepril (LOTENSIN) 5 MG tablet     . cloNIDine (CATAPRES - DOSED IN MG/24 HR) 0.2 mg/24hr patch     . clopidogrel (PLAVIX) 75 MG tablet     . omeprazole (PRILOSEC) 20 MG capsule     . pravastatin (PRAVACHOL) 20 MG tablet     . VOLTAREN 1 % GEL      No current facility-administered medications for this visit.         Past Medical History:  Diagnosis Date  . Allergy   . Arthritis   . Hypertension          Past Surgical History:  Procedure Laterality Date  . APPENDECTOMY    . BACK SURGERY    . CAROTID ENDARTERECTOMY Right   . CHOLECYSTECTOMY    . EYE SURGERY Bilateral   . FOOT SURGERY Left   . HERNIA REPAIR    . HIP SURGERY Left   . TONSILLECTOMY      Social History     Social History  Substance Use Topics  . Smoking status: Never Smoker  . Smokeless tobacco: Never Used  . Alcohol use No     Family History      Family History  Problem Relation Age of Onset  . Congestive Heart Failure Mother   . Heart disease Mother   . Heart disease Father   . Diabetes Brother          Allergies  Allergen Reactions  .  Sulfa Antibiotics      REVIEW OF SYSTEMS(Negative unless checked)  Constitutional: [] Weight loss[] Fever[] Chills Cardiac:[] Chest pain[] Chest pressure[] Palpitations [] Shortness of breath when laying flat [] Shortness of breath at rest [] Shortness of breath with exertion. Vascular: [] Pain in legs with walking[] Pain in legsat rest[] Pain in legs when laying flat [] Claudication [] Pain in feet when walking [] Pain in feet at rest [] Pain in feet when laying flat [] History of DVT [] Phlebitis [] Swelling in legs [] Varicose veins [] Non-healing ulcers Pulmonary: [] Uses home oxygen [] Productive cough[] Hemoptysis [] Wheeze [] COPD [] Asthma Neurologic: [] Dizziness [] Blackouts [] Seizures [] History of stroke [] History of TIA[] Aphasia [] Temporary blindness[] Dysphagia [] Weaknessor numbness in arms [] Weakness or numbnessin legs Musculoskeletal: [x] Arthritis [] Joint swelling [x] Joint pain [] Low back pain Hematologic:[] Easy bruising[] Easy bleeding [] Hypercoagulable state [] Anemic [] Hepatitis Gastrointestinal:[] Blood in stool[] Vomiting blood[] Gastroesophageal reflux/heartburn[] Difficulty swallowing. Genitourinary: [] Chronic kidney disease [] Difficulturination [] Frequenturination [] Burning with urination[] Blood in urine Skin: [] Rashes [] Ulcers [] Wounds Psychological: [] History of anxiety[] History of major depression.       Physical Examination  Vitals:   07/10/17 1207 07/10/17 1208  BP: (!) 155/67 (!) 184/84  Pulse: 91 80  Resp: 17   Weight: 169 lb (76.7 kg)    Body mass index is 29.94 kg/m. Gen:  WD/WN, NAD. Appears younger than stated age. Head: [place]/AT, No temporalis wasting. Ear/Nose/Throat: Hearing grossly intact, nares w/o erythema or drainage, trachea midline Eyes: Conjunctiva clear. Sclera non-icteric Neck: Supple. Left carotid bruit Pulmonary:  Good air movement, equal and  clear to auscultation bilaterally.  Cardiac: RRR, no JVD Vascular:  Vessel Right Left  Radial Palpable Palpable                                   Musculoskeletal: M/S 5/5 throughout.  No deformity or atrophy. Walks with a walker Neurologic: CN 2-12 intact. Sensation grossly intact in extremities.  Symmetrical.  Speech is fluent. Motor exam as listed above. Psychiatric: Judgment intact, Mood & affect appropriate for pt's clinical situation. Dermatologic: No rashes or ulcers noted.  No cellulitis or open wounds.      CBC Lab Results  Component Value Date   WBC 11.6 (H) 04/07/2014   HGB 10.0 (L) 04/07/2014   HCT 30.7 (L) 04/07/2014   MCV 100 04/07/2014   PLT 214 04/07/2014    BMET    Component Value Date/Time   NA 142 04/07/2014 0409   K 4.0 04/07/2014 0409   CL 107 04/07/2014 0409   CO2 26 04/07/2014 0409   GLUCOSE 87 04/07/2014 0409   BUN 26 (H) 04/07/2014 0409   CREATININE 1.43 (H) 04/07/2014 0409   CALCIUM 8.8 04/07/2014 0409   GFRNONAA 33 (L) 04/07/2014 0409   GFRAA 39 (L) 04/07/2014 0409   CrCl cannot be calculated (Patient's most recent lab result is older than the maximum 21 days allowed.).  COAG Lab Results  Component Value Date   INR 1.0 06/19/2013    Radiology No results found.   Assessment/Plan Hyperlipidemia lipid control important in reducing the progression of atherosclerotic disease. Continue statin therapy   Essential hypertension, benign blood pressure control important in reducing the progression of atherosclerotic disease. On appropriate oral medications.   Carotid stenosis Her carotid duplex today reveals a widely patent right carotid endarterectomy. Her left carotid stenosis is basically stable in the 60-79% range. She is doing well with medical management. She will continue her Plavix and statin agent. Plan to recheck again in 6 months.       Leotis Pain, MD  07/10/2017 12:33 PM    This note was created with  Dragon medical transcription system.  Any errors from dictation are purely unintentional

## 2017-07-13 DIAGNOSIS — I6523 Occlusion and stenosis of bilateral carotid arteries: Secondary | ICD-10-CM | POA: Diagnosis not present

## 2017-07-13 DIAGNOSIS — M6281 Muscle weakness (generalized): Secondary | ICD-10-CM | POA: Diagnosis not present

## 2017-07-13 DIAGNOSIS — I10 Essential (primary) hypertension: Secondary | ICD-10-CM | POA: Diagnosis not present

## 2017-07-13 DIAGNOSIS — Z87440 Personal history of urinary (tract) infections: Secondary | ICD-10-CM | POA: Diagnosis not present

## 2017-07-13 DIAGNOSIS — D519 Vitamin B12 deficiency anemia, unspecified: Secondary | ICD-10-CM | POA: Diagnosis not present

## 2017-07-13 DIAGNOSIS — E119 Type 2 diabetes mellitus without complications: Secondary | ICD-10-CM | POA: Diagnosis not present

## 2017-07-13 DIAGNOSIS — I739 Peripheral vascular disease, unspecified: Secondary | ICD-10-CM | POA: Diagnosis not present

## 2017-07-19 DIAGNOSIS — N183 Chronic kidney disease, stage 3 (moderate): Secondary | ICD-10-CM | POA: Diagnosis not present

## 2017-07-19 DIAGNOSIS — M6281 Muscle weakness (generalized): Secondary | ICD-10-CM | POA: Diagnosis not present

## 2017-07-19 DIAGNOSIS — I6523 Occlusion and stenosis of bilateral carotid arteries: Secondary | ICD-10-CM | POA: Diagnosis not present

## 2017-07-19 DIAGNOSIS — Z87440 Personal history of urinary (tract) infections: Secondary | ICD-10-CM | POA: Diagnosis not present

## 2017-07-19 DIAGNOSIS — M19012 Primary osteoarthritis, left shoulder: Secondary | ICD-10-CM | POA: Diagnosis not present

## 2017-07-19 DIAGNOSIS — I739 Peripheral vascular disease, unspecified: Secondary | ICD-10-CM | POA: Diagnosis not present

## 2017-07-19 DIAGNOSIS — I129 Hypertensive chronic kidney disease with stage 1 through stage 4 chronic kidney disease, or unspecified chronic kidney disease: Secondary | ICD-10-CM | POA: Diagnosis not present

## 2017-07-19 DIAGNOSIS — E119 Type 2 diabetes mellitus without complications: Secondary | ICD-10-CM | POA: Diagnosis not present

## 2017-07-19 DIAGNOSIS — D519 Vitamin B12 deficiency anemia, unspecified: Secondary | ICD-10-CM | POA: Diagnosis not present

## 2017-07-20 DIAGNOSIS — Z87440 Personal history of urinary (tract) infections: Secondary | ICD-10-CM | POA: Diagnosis not present

## 2017-07-20 DIAGNOSIS — M6281 Muscle weakness (generalized): Secondary | ICD-10-CM | POA: Diagnosis not present

## 2017-07-20 DIAGNOSIS — D519 Vitamin B12 deficiency anemia, unspecified: Secondary | ICD-10-CM | POA: Diagnosis not present

## 2017-07-20 DIAGNOSIS — N183 Chronic kidney disease, stage 3 (moderate): Secondary | ICD-10-CM | POA: Diagnosis not present

## 2017-07-20 DIAGNOSIS — E119 Type 2 diabetes mellitus without complications: Secondary | ICD-10-CM | POA: Diagnosis not present

## 2017-07-20 DIAGNOSIS — M19012 Primary osteoarthritis, left shoulder: Secondary | ICD-10-CM | POA: Diagnosis not present

## 2017-07-20 DIAGNOSIS — I129 Hypertensive chronic kidney disease with stage 1 through stage 4 chronic kidney disease, or unspecified chronic kidney disease: Secondary | ICD-10-CM | POA: Diagnosis not present

## 2017-07-20 DIAGNOSIS — I6523 Occlusion and stenosis of bilateral carotid arteries: Secondary | ICD-10-CM | POA: Diagnosis not present

## 2017-07-20 DIAGNOSIS — I739 Peripheral vascular disease, unspecified: Secondary | ICD-10-CM | POA: Diagnosis not present

## 2017-07-27 DIAGNOSIS — D519 Vitamin B12 deficiency anemia, unspecified: Secondary | ICD-10-CM | POA: Diagnosis not present

## 2017-07-27 DIAGNOSIS — M19012 Primary osteoarthritis, left shoulder: Secondary | ICD-10-CM | POA: Diagnosis not present

## 2017-07-27 DIAGNOSIS — I129 Hypertensive chronic kidney disease with stage 1 through stage 4 chronic kidney disease, or unspecified chronic kidney disease: Secondary | ICD-10-CM | POA: Diagnosis not present

## 2017-07-27 DIAGNOSIS — M6281 Muscle weakness (generalized): Secondary | ICD-10-CM | POA: Diagnosis not present

## 2017-07-27 DIAGNOSIS — Z87440 Personal history of urinary (tract) infections: Secondary | ICD-10-CM | POA: Diagnosis not present

## 2017-07-27 DIAGNOSIS — N183 Chronic kidney disease, stage 3 (moderate): Secondary | ICD-10-CM | POA: Diagnosis not present

## 2017-07-27 DIAGNOSIS — I739 Peripheral vascular disease, unspecified: Secondary | ICD-10-CM | POA: Diagnosis not present

## 2017-07-27 DIAGNOSIS — I6523 Occlusion and stenosis of bilateral carotid arteries: Secondary | ICD-10-CM | POA: Diagnosis not present

## 2017-07-27 DIAGNOSIS — E119 Type 2 diabetes mellitus without complications: Secondary | ICD-10-CM | POA: Diagnosis not present

## 2017-07-31 DIAGNOSIS — M6281 Muscle weakness (generalized): Secondary | ICD-10-CM | POA: Diagnosis not present

## 2017-07-31 DIAGNOSIS — I739 Peripheral vascular disease, unspecified: Secondary | ICD-10-CM | POA: Diagnosis not present

## 2017-07-31 DIAGNOSIS — N183 Chronic kidney disease, stage 3 (moderate): Secondary | ICD-10-CM | POA: Diagnosis not present

## 2017-07-31 DIAGNOSIS — E119 Type 2 diabetes mellitus without complications: Secondary | ICD-10-CM | POA: Diagnosis not present

## 2017-07-31 DIAGNOSIS — M19012 Primary osteoarthritis, left shoulder: Secondary | ICD-10-CM | POA: Diagnosis not present

## 2017-07-31 DIAGNOSIS — I129 Hypertensive chronic kidney disease with stage 1 through stage 4 chronic kidney disease, or unspecified chronic kidney disease: Secondary | ICD-10-CM | POA: Diagnosis not present

## 2017-07-31 DIAGNOSIS — D519 Vitamin B12 deficiency anemia, unspecified: Secondary | ICD-10-CM | POA: Diagnosis not present

## 2017-07-31 DIAGNOSIS — Z87440 Personal history of urinary (tract) infections: Secondary | ICD-10-CM | POA: Diagnosis not present

## 2017-07-31 DIAGNOSIS — I6523 Occlusion and stenosis of bilateral carotid arteries: Secondary | ICD-10-CM | POA: Diagnosis not present

## 2017-08-03 DIAGNOSIS — M19012 Primary osteoarthritis, left shoulder: Secondary | ICD-10-CM | POA: Diagnosis not present

## 2017-08-03 DIAGNOSIS — E119 Type 2 diabetes mellitus without complications: Secondary | ICD-10-CM | POA: Diagnosis not present

## 2017-08-03 DIAGNOSIS — M6281 Muscle weakness (generalized): Secondary | ICD-10-CM | POA: Diagnosis not present

## 2017-08-03 DIAGNOSIS — I739 Peripheral vascular disease, unspecified: Secondary | ICD-10-CM | POA: Diagnosis not present

## 2017-08-03 DIAGNOSIS — I129 Hypertensive chronic kidney disease with stage 1 through stage 4 chronic kidney disease, or unspecified chronic kidney disease: Secondary | ICD-10-CM | POA: Diagnosis not present

## 2017-08-03 DIAGNOSIS — I6523 Occlusion and stenosis of bilateral carotid arteries: Secondary | ICD-10-CM | POA: Diagnosis not present

## 2017-08-03 DIAGNOSIS — Z87440 Personal history of urinary (tract) infections: Secondary | ICD-10-CM | POA: Diagnosis not present

## 2017-08-03 DIAGNOSIS — N183 Chronic kidney disease, stage 3 (moderate): Secondary | ICD-10-CM | POA: Diagnosis not present

## 2017-08-03 DIAGNOSIS — D519 Vitamin B12 deficiency anemia, unspecified: Secondary | ICD-10-CM | POA: Diagnosis not present

## 2017-08-17 DIAGNOSIS — E119 Type 2 diabetes mellitus without complications: Secondary | ICD-10-CM | POA: Diagnosis not present

## 2017-08-17 DIAGNOSIS — N183 Chronic kidney disease, stage 3 (moderate): Secondary | ICD-10-CM | POA: Diagnosis not present

## 2017-08-17 DIAGNOSIS — D519 Vitamin B12 deficiency anemia, unspecified: Secondary | ICD-10-CM | POA: Diagnosis not present

## 2017-08-17 DIAGNOSIS — M19012 Primary osteoarthritis, left shoulder: Secondary | ICD-10-CM | POA: Diagnosis not present

## 2017-08-17 DIAGNOSIS — M6281 Muscle weakness (generalized): Secondary | ICD-10-CM | POA: Diagnosis not present

## 2017-08-17 DIAGNOSIS — I6523 Occlusion and stenosis of bilateral carotid arteries: Secondary | ICD-10-CM | POA: Diagnosis not present

## 2017-08-17 DIAGNOSIS — I739 Peripheral vascular disease, unspecified: Secondary | ICD-10-CM | POA: Diagnosis not present

## 2017-08-17 DIAGNOSIS — I129 Hypertensive chronic kidney disease with stage 1 through stage 4 chronic kidney disease, or unspecified chronic kidney disease: Secondary | ICD-10-CM | POA: Diagnosis not present

## 2017-08-17 DIAGNOSIS — Z87440 Personal history of urinary (tract) infections: Secondary | ICD-10-CM | POA: Diagnosis not present

## 2017-08-21 ENCOUNTER — Encounter: Payer: Self-pay | Admitting: Nurse Practitioner

## 2017-08-21 ENCOUNTER — Ambulatory Visit (INDEPENDENT_AMBULATORY_CARE_PROVIDER_SITE_OTHER): Payer: Medicare HMO | Admitting: Nurse Practitioner

## 2017-08-21 VITALS — BP 156/86 | HR 89 | Resp 16 | Ht 62.0 in | Wt 166.8 lb

## 2017-08-21 DIAGNOSIS — I679 Cerebrovascular disease, unspecified: Secondary | ICD-10-CM | POA: Insufficient documentation

## 2017-08-21 DIAGNOSIS — K219 Gastro-esophageal reflux disease without esophagitis: Secondary | ICD-10-CM | POA: Insufficient documentation

## 2017-08-21 DIAGNOSIS — J0191 Acute recurrent sinusitis, unspecified: Secondary | ICD-10-CM | POA: Insufficient documentation

## 2017-08-21 DIAGNOSIS — R531 Weakness: Secondary | ICD-10-CM | POA: Insufficient documentation

## 2017-08-21 DIAGNOSIS — I739 Peripheral vascular disease, unspecified: Secondary | ICD-10-CM | POA: Insufficient documentation

## 2017-08-21 DIAGNOSIS — E119 Type 2 diabetes mellitus without complications: Secondary | ICD-10-CM | POA: Insufficient documentation

## 2017-08-21 DIAGNOSIS — Z0001 Encounter for general adult medical examination with abnormal findings: Secondary | ICD-10-CM

## 2017-08-21 DIAGNOSIS — E039 Hypothyroidism, unspecified: Secondary | ICD-10-CM | POA: Diagnosis not present

## 2017-08-21 DIAGNOSIS — I6523 Occlusion and stenosis of bilateral carotid arteries: Secondary | ICD-10-CM

## 2017-08-21 DIAGNOSIS — G2581 Restless legs syndrome: Secondary | ICD-10-CM | POA: Insufficient documentation

## 2017-08-21 DIAGNOSIS — N189 Chronic kidney disease, unspecified: Secondary | ICD-10-CM | POA: Insufficient documentation

## 2017-08-21 DIAGNOSIS — R0602 Shortness of breath: Secondary | ICD-10-CM | POA: Insufficient documentation

## 2017-08-21 DIAGNOSIS — I499 Cardiac arrhythmia, unspecified: Secondary | ICD-10-CM | POA: Insufficient documentation

## 2017-08-21 DIAGNOSIS — I6522 Occlusion and stenosis of left carotid artery: Secondary | ICD-10-CM | POA: Insufficient documentation

## 2017-08-21 DIAGNOSIS — J301 Allergic rhinitis due to pollen: Secondary | ICD-10-CM | POA: Insufficient documentation

## 2017-08-21 DIAGNOSIS — R062 Wheezing: Secondary | ICD-10-CM | POA: Insufficient documentation

## 2017-08-21 DIAGNOSIS — E875 Hyperkalemia: Secondary | ICD-10-CM | POA: Insufficient documentation

## 2017-08-21 DIAGNOSIS — M150 Primary generalized (osteo)arthritis: Secondary | ICD-10-CM | POA: Insufficient documentation

## 2017-08-21 DIAGNOSIS — E6609 Other obesity due to excess calories: Secondary | ICD-10-CM | POA: Insufficient documentation

## 2017-08-21 DIAGNOSIS — E559 Vitamin D deficiency, unspecified: Secondary | ICD-10-CM

## 2017-08-21 DIAGNOSIS — N390 Urinary tract infection, site not specified: Secondary | ICD-10-CM

## 2017-08-21 DIAGNOSIS — R110 Nausea: Secondary | ICD-10-CM | POA: Insufficient documentation

## 2017-08-21 DIAGNOSIS — D519 Vitamin B12 deficiency anemia, unspecified: Secondary | ICD-10-CM | POA: Insufficient documentation

## 2017-08-21 DIAGNOSIS — M109 Gout, unspecified: Secondary | ICD-10-CM | POA: Insufficient documentation

## 2017-08-21 LAB — POCT URINALYSIS DIPSTICK
Bilirubin, UA: NEGATIVE
Blood, UA: NEGATIVE
GLUCOSE UA: NEGATIVE
Ketones, UA: NEGATIVE
NITRITE UA: NEGATIVE
Spec Grav, UA: 1.01 (ref 1.010–1.025)
Urobilinogen, UA: 0.2 E.U./dL
pH, UA: 6.5 (ref 5.0–8.0)

## 2017-08-21 MED ORDER — AMOXICILLIN 875 MG PO TABS: 875.0000 mg | ORAL_TABLET | Freq: Two times a day (BID) | ORAL | 0 refills | Status: DC

## 2017-08-21 NOTE — Progress Notes (Signed)
[facility] [place], [place] [address]  Internal MEDICINE  Office Visit Note  Patient Name: [name]  [id number]  [id number]  Date of Service: 08/26/2017     Complaints/HPI Pt is here for routine health maintenance examination  The patient is here for health maintenance exam. Today, she is c/o frequency with urination and left sided flank pain. The pain has been going on this week. Has been eating a lot more acidic foods which might make this worse. Has increased her water intake which will help sometimes.  She is also c/o left ear pain. States that every once in a while the left ear just jurts some.     Current Medication: Outpatient Encounter Medications as of 08/21/2017  Medication Sig Note  . Cholecalciferol (VITAMIN D3) 1000 units CAPS Take by mouth daily.   Marland Kitchen conjugated estrogens (PREMARIN) vaginal cream Place 1 Applicatorful vaginally once a week. At night.   . ferrous sulfate 325 (65 FE) MG tablet Take 325 mg by mouth 2 (two) times daily.   . fluticasone (FLONASE) 50 MCG/ACT nasal spray Place 1 spray into both nostrils daily.   . naproxen (NAPROSYN) 250 MG tablet Take 250 mg by mouth 2 (two) times daily. Take 1-2 tablets BID.   Marland Kitchen triamcinolone cream (KENALOG) 0.1 % Apply 1 application topically 2 (two) times daily.   Marland Kitchen allopurinol (ZYLOPRIM) 100 MG tablet  06/20/2016: Received from: External Pharmacy  . ALPRAZolam (XANAX) 0.25 MG tablet  06/20/2016: Received from: External Pharmacy  . amLODipine (NORVASC) 2.5 MG tablet Take 2.5 mg by mouth at bedtime.  06/20/2016: Received from: External Pharmacy  . amoxicillin (AMOXIL) 875 MG tablet Take 1 tablet (875 mg total) by mouth 2 (two) times daily.   . benazepril (LOTENSIN) 5 MG tablet  06/20/2016: Received from: External Pharmacy  . cloNIDine (CATAPRES - DOSED IN MG/24 HR) 0.2 mg/24hr patch  06/20/2016: Received from: External Pharmacy  . clopidogrel (PLAVIX) 75 MG tablet Take 75 mg by mouth daily.   06/20/2016: Received from: External Pharmacy  . omeprazole (PRILOSEC) 20 MG capsule Take 20 mg by mouth daily.  06/20/2016: Received from: External Pharmacy  . pravastatin (PRAVACHOL) 20 MG tablet Take 20 mg by mouth daily.  06/20/2016: Received from: External Pharmacy  . VOLTAREN 1 % GEL  06/20/2016: Received from: External Pharmacy   No facility-administered encounter medications on file as of 08/21/2017.     Surgical History: Past Surgical History:  Procedure Laterality Date  . APPENDECTOMY    . BACK SURGERY    . CAROTID ENDARTERECTOMY Right   . CHOLECYSTECTOMY    . EYE SURGERY Bilateral   . FOOT SURGERY Left   . HERNIA REPAIR    . HIP SURGERY Left   . TONSILLECTOMY      Medical History: Past Medical History:  Diagnosis Date  . Allergy   . Arthritis   . CKD (chronic kidney disease), stage III ([place])   . GERD (gastroesophageal reflux disease)   . Hyperlipidemia   . Hypertension   . Insomnia   . Stroke [facility])     Family History: Family History  Problem Relation Age of Onset  . Congestive Heart Failure Mother   . Heart disease Mother   . Heart disease Father   . Diabetes Brother       Review of Systems  Constitutional: Negative.   HENT: Positive for ear pain.   Eyes: Negative.   Respiratory: Negative for chest tightness and shortness of breath.  Cardiovascular: Negative for chest pain, palpitations and leg swelling.  Gastrointestinal: Negative for constipation, diarrhea, nausea and vomiting.  Endocrine: Negative.   Genitourinary: Positive for dysuria, flank pain and frequency.  Musculoskeletal: Positive for arthralgias and back pain.  Skin: Negative.   Allergic/Immunologic: Negative.   Neurological: Negative.   Hematological: Negative.   Psychiatric/Behavioral: Negative.      Today's Vitals   08/21/17 1405  BP: (!) 156/86  Pulse: 89  Resp: 16  SpO2: 97%  Weight: 166 lb 12.8 oz (75.7 kg)  Height: 5\' 2"  (1.575 m)    Physical Exam  Constitutional: She  is oriented to person, place, and time. She appears well-developed and well-nourished. No distress.  HENT:  Head: Normocephalic and atraumatic.  Mouth/Throat: Oropharynx is clear and moist. No oropharyngeal exudate.  Eyes: EOM are normal. Pupils are equal, round, and reactive to light.  Neck: Normal range of motion. Neck supple. No JVD present. Carotid bruit is not present. No tracheal deviation present. No thyromegaly present.  Cardiovascular: Normal rate, regular rhythm, normal heart sounds and intact distal pulses. Exam reveals no gallop and no friction rub.  No murmur heard. Pulmonary/Chest: Effort normal and breath sounds normal. No respiratory distress. She has no wheezes. She has no rales. She exhibits no tenderness.  Abdominal: Soft. Bowel sounds are normal. There is no tenderness.  Musculoskeletal: Normal range of motion.  Lymphadenopathy:    She has no cervical adenopathy.  Neurological: She is alert and oriented to person, place, and time. No cranial nerve deficit.  Skin: Skin is warm and dry. She is not diaphoretic.  Psychiatric: She has a normal mood and affect. Her behavior is normal. Judgment and thought content normal.  Nursing note and vitals reviewed.    LABS: Recent Results (from the past 2160 hour(s))  POCT Urinalysis Dipstick     Status: Abnormal   Collection Time: 08/21/17  3:17 PM  Result Value Ref Range   Color, UA     Clarity, UA     Glucose, UA neg    Bilirubin, UA neg    Ketones, UA neg    Spec Grav, UA 1.010 1.010 - 1.025   Blood, UA neg    pH, UA 6.5 5.0 - 8.0   Protein, UA trace    Urobilinogen, UA 0.2 0.2 or 1.0 E.U./dL   Nitrite, UA neg    Leukocytes, UA Trace (A) Negative   Appearance     Odor    CULTURE, URINE COMPREHENSIVE     Status: Abnormal   Collection Time: 08/21/17  4:21 PM  Result Value Ref Range   Urine Culture, Comprehensive Final report (A)    Organism ID, Bacteria Escherichia coli (A)     Comment: 10,000-25,000 colony forming  units per mL Cefazolin <=4 ug/mL Cefazolin with an MIC <=16 predicts susceptibility to the oral agents cefaclor, cefdinir, cefpodoxime, cefprozil, cefuroxime, cephalexin, and loracarbef when used for therapy of uncomplicated urinary tract infections due to E. coli, Klebsiella pneumoniae, and Proteus mirabilis.    ANTIMICROBIAL SUSCEPTIBILITY Comment     Comment:       ** S = Susceptible; I = Intermediate; R = Resistant **                    P = Positive; N = Negative             MICS are expressed in micrograms per mL    Antibiotic  RSLT#1    RSLT#2    RSLT#3    RSLT#4 Amoxicillin/Clavulanic Acid    S Ampicillin                     S Cefepime                       S Ceftriaxone                    S Cefuroxime                     S Ciprofloxacin                  S Ertapenem                      S Gentamicin                     S Imipenem                       S Levofloxacin                   S Meropenem                      S Nitrofurantoin                 S Piperacillin/Tazobactam        S Tetracycline                   S Tobramycin                     S Trimethoprim/Sulfa             S      Assessment and Plan:    ICD-10-CM   1. Encounter for general adult medical examination with abnormal findings Z00.01 CBC with Differential/Platelet    Comprehensive metabolic panel    Lipid panel  2. Urinary tract infection without hematuria, site unspecified N39.0 amoxicillin (AMOXIL) 875 MG tablet    POCT Urinalysis Dipstick    CULTURE, URINE COMPREHENSIVE  3. Hypothyroidism, unspecified type E03.9 T4, free    TSH  4. Gastro-esophageal reflux disease without esophagitis K21.9   5. Occlusion and stenosis of bilateral carotid arteries I65.23 Lipid panel  6. Vitamin D deficiency E55.9 Vitamin D 1,25 dihydroxy  1. Annual wellness visit today.  2. U/a showing trace WBC and trace protein. Add amoxicillin 875mg  twice daily. Will send urine for culture and sensitivity and  adjust abx as indicated.  3. Check thryoid panel and adjjust levothyroxine as indicated.  4. Continue omeprazole as prescribed  5. Reviewed most recent carotid doppler showing stable exam. Will ccontinue to monitor.  6. Check vitamin d leel and treat deficiency as indicated.      Patient Active Problem List   Diagnosis Date Noted  . Hypothyroidism, unspecified 08/21/2017  . Peripheral vascular disease, unspecified ([place]) 08/21/2017  . Other fatigue 08/21/2017  . Type 2 diabetes mellitus without complications ([place]) 14/05/3012  . Restless leg syndrome 08/21/2017  . Cardiac arrhythmia, unspecified 08/21/2017  . Primary generalized (osteo)arthritis 08/21/2017  . Vitamin B12 deficiency anemia, unspecified 08/21/2017  . Other obesity due to excess calories 08/21/2017  . Chronic kidney disease, unspecified 08/21/2017  . Gastro-esophageal reflux disease without esophagitis 08/21/2017  . Hyperkalemia  08/21/2017  . Shortness of breath 08/21/2017  . Wheezing 08/21/2017  . Nausea 08/21/2017  . Allergic rhinitis due to pollen 08/21/2017  . Acute recurrent sinusitis, unspecified 08/21/2017  . Cerebrovascular disease, unspecified 08/21/2017  . Occlusion and stenosis of bilateral carotid arteries 08/21/2017  . Gout, unspecified 08/21/2017  . Essential hypertension, benign 01/02/2017  . Hyperlipidemia 06/20/2016  . Carotid stenosis 06/20/2016      General Counseling: I have discussed the findings of the evaluation and examination with [facility].  I have also discussed any further diagnostic evaluation that may be needed or ordered today. Anniya verbalizes understanding of the findings of todays visit. We also reviewed her medications today. she has been encouraged to call the office with any questions or concerns that should arise related to todays visit.  This patient was seen by Leretha Pol, FNP- C in Collaboration with Dr Lavera Guise as a part of collaborative care agreement   Time spent:30  minutes    Lavera Guise, MD  Internal Medicine

## 2017-08-23 DIAGNOSIS — I6523 Occlusion and stenosis of bilateral carotid arteries: Secondary | ICD-10-CM | POA: Diagnosis not present

## 2017-08-23 DIAGNOSIS — I739 Peripheral vascular disease, unspecified: Secondary | ICD-10-CM | POA: Diagnosis not present

## 2017-08-23 DIAGNOSIS — D519 Vitamin B12 deficiency anemia, unspecified: Secondary | ICD-10-CM | POA: Diagnosis not present

## 2017-08-23 DIAGNOSIS — M6281 Muscle weakness (generalized): Secondary | ICD-10-CM | POA: Diagnosis not present

## 2017-08-23 DIAGNOSIS — Z87440 Personal history of urinary (tract) infections: Secondary | ICD-10-CM | POA: Diagnosis not present

## 2017-08-23 DIAGNOSIS — M19012 Primary osteoarthritis, left shoulder: Secondary | ICD-10-CM | POA: Diagnosis not present

## 2017-08-23 DIAGNOSIS — I129 Hypertensive chronic kidney disease with stage 1 through stage 4 chronic kidney disease, or unspecified chronic kidney disease: Secondary | ICD-10-CM | POA: Diagnosis not present

## 2017-08-23 DIAGNOSIS — E119 Type 2 diabetes mellitus without complications: Secondary | ICD-10-CM | POA: Diagnosis not present

## 2017-08-23 DIAGNOSIS — N183 Chronic kidney disease, stage 3 (moderate): Secondary | ICD-10-CM | POA: Diagnosis not present

## 2017-08-24 DIAGNOSIS — I129 Hypertensive chronic kidney disease with stage 1 through stage 4 chronic kidney disease, or unspecified chronic kidney disease: Secondary | ICD-10-CM | POA: Diagnosis not present

## 2017-08-24 DIAGNOSIS — M19012 Primary osteoarthritis, left shoulder: Secondary | ICD-10-CM | POA: Diagnosis not present

## 2017-08-24 DIAGNOSIS — M6281 Muscle weakness (generalized): Secondary | ICD-10-CM | POA: Diagnosis not present

## 2017-08-24 DIAGNOSIS — Z87440 Personal history of urinary (tract) infections: Secondary | ICD-10-CM | POA: Diagnosis not present

## 2017-08-24 DIAGNOSIS — I739 Peripheral vascular disease, unspecified: Secondary | ICD-10-CM | POA: Diagnosis not present

## 2017-08-24 DIAGNOSIS — N183 Chronic kidney disease, stage 3 (moderate): Secondary | ICD-10-CM | POA: Diagnosis not present

## 2017-08-24 DIAGNOSIS — E119 Type 2 diabetes mellitus without complications: Secondary | ICD-10-CM | POA: Diagnosis not present

## 2017-08-24 DIAGNOSIS — I6523 Occlusion and stenosis of bilateral carotid arteries: Secondary | ICD-10-CM | POA: Diagnosis not present

## 2017-08-24 DIAGNOSIS — D519 Vitamin B12 deficiency anemia, unspecified: Secondary | ICD-10-CM | POA: Diagnosis not present

## 2017-08-24 LAB — CULTURE, URINE COMPREHENSIVE

## 2017-09-05 DIAGNOSIS — Z87440 Personal history of urinary (tract) infections: Secondary | ICD-10-CM | POA: Diagnosis not present

## 2017-09-05 DIAGNOSIS — M19012 Primary osteoarthritis, left shoulder: Secondary | ICD-10-CM | POA: Diagnosis not present

## 2017-09-05 DIAGNOSIS — M6281 Muscle weakness (generalized): Secondary | ICD-10-CM | POA: Diagnosis not present

## 2017-09-05 DIAGNOSIS — I6523 Occlusion and stenosis of bilateral carotid arteries: Secondary | ICD-10-CM | POA: Diagnosis not present

## 2017-09-05 DIAGNOSIS — I129 Hypertensive chronic kidney disease with stage 1 through stage 4 chronic kidney disease, or unspecified chronic kidney disease: Secondary | ICD-10-CM | POA: Diagnosis not present

## 2017-09-05 DIAGNOSIS — D519 Vitamin B12 deficiency anemia, unspecified: Secondary | ICD-10-CM | POA: Diagnosis not present

## 2017-09-05 DIAGNOSIS — N183 Chronic kidney disease, stage 3 (moderate): Secondary | ICD-10-CM | POA: Diagnosis not present

## 2017-09-05 DIAGNOSIS — E119 Type 2 diabetes mellitus without complications: Secondary | ICD-10-CM | POA: Diagnosis not present

## 2017-09-05 DIAGNOSIS — I739 Peripheral vascular disease, unspecified: Secondary | ICD-10-CM | POA: Diagnosis not present

## 2017-09-07 DIAGNOSIS — E119 Type 2 diabetes mellitus without complications: Secondary | ICD-10-CM | POA: Diagnosis not present

## 2017-09-07 DIAGNOSIS — M6281 Muscle weakness (generalized): Secondary | ICD-10-CM | POA: Diagnosis not present

## 2017-09-07 DIAGNOSIS — Z87440 Personal history of urinary (tract) infections: Secondary | ICD-10-CM | POA: Diagnosis not present

## 2017-09-07 DIAGNOSIS — D519 Vitamin B12 deficiency anemia, unspecified: Secondary | ICD-10-CM | POA: Diagnosis not present

## 2017-09-07 DIAGNOSIS — I739 Peripheral vascular disease, unspecified: Secondary | ICD-10-CM | POA: Diagnosis not present

## 2017-09-07 DIAGNOSIS — I6523 Occlusion and stenosis of bilateral carotid arteries: Secondary | ICD-10-CM | POA: Diagnosis not present

## 2017-09-07 DIAGNOSIS — N183 Chronic kidney disease, stage 3 (moderate): Secondary | ICD-10-CM | POA: Diagnosis not present

## 2017-09-07 DIAGNOSIS — I129 Hypertensive chronic kidney disease with stage 1 through stage 4 chronic kidney disease, or unspecified chronic kidney disease: Secondary | ICD-10-CM | POA: Diagnosis not present

## 2017-09-07 DIAGNOSIS — M19012 Primary osteoarthritis, left shoulder: Secondary | ICD-10-CM | POA: Diagnosis not present

## 2017-09-14 DIAGNOSIS — I739 Peripheral vascular disease, unspecified: Secondary | ICD-10-CM | POA: Diagnosis not present

## 2017-09-14 DIAGNOSIS — M19012 Primary osteoarthritis, left shoulder: Secondary | ICD-10-CM | POA: Diagnosis not present

## 2017-09-14 DIAGNOSIS — M6281 Muscle weakness (generalized): Secondary | ICD-10-CM | POA: Diagnosis not present

## 2017-09-14 DIAGNOSIS — I6523 Occlusion and stenosis of bilateral carotid arteries: Secondary | ICD-10-CM | POA: Diagnosis not present

## 2017-09-14 DIAGNOSIS — I129 Hypertensive chronic kidney disease with stage 1 through stage 4 chronic kidney disease, or unspecified chronic kidney disease: Secondary | ICD-10-CM | POA: Diagnosis not present

## 2017-09-14 DIAGNOSIS — D519 Vitamin B12 deficiency anemia, unspecified: Secondary | ICD-10-CM | POA: Diagnosis not present

## 2017-09-14 DIAGNOSIS — E119 Type 2 diabetes mellitus without complications: Secondary | ICD-10-CM | POA: Diagnosis not present

## 2017-09-14 DIAGNOSIS — N183 Chronic kidney disease, stage 3 (moderate): Secondary | ICD-10-CM | POA: Diagnosis not present

## 2017-09-14 DIAGNOSIS — Z87440 Personal history of urinary (tract) infections: Secondary | ICD-10-CM | POA: Diagnosis not present

## 2017-09-19 DIAGNOSIS — I6523 Occlusion and stenosis of bilateral carotid arteries: Secondary | ICD-10-CM | POA: Diagnosis not present

## 2017-09-19 DIAGNOSIS — Z87440 Personal history of urinary (tract) infections: Secondary | ICD-10-CM | POA: Diagnosis not present

## 2017-09-19 DIAGNOSIS — M19012 Primary osteoarthritis, left shoulder: Secondary | ICD-10-CM | POA: Diagnosis not present

## 2017-09-19 DIAGNOSIS — M6281 Muscle weakness (generalized): Secondary | ICD-10-CM | POA: Diagnosis not present

## 2017-09-19 DIAGNOSIS — E119 Type 2 diabetes mellitus without complications: Secondary | ICD-10-CM | POA: Diagnosis not present

## 2017-09-19 DIAGNOSIS — I129 Hypertensive chronic kidney disease with stage 1 through stage 4 chronic kidney disease, or unspecified chronic kidney disease: Secondary | ICD-10-CM | POA: Diagnosis not present

## 2017-09-19 DIAGNOSIS — I739 Peripheral vascular disease, unspecified: Secondary | ICD-10-CM | POA: Diagnosis not present

## 2017-09-19 DIAGNOSIS — D519 Vitamin B12 deficiency anemia, unspecified: Secondary | ICD-10-CM | POA: Diagnosis not present

## 2017-09-19 DIAGNOSIS — N183 Chronic kidney disease, stage 3 (moderate): Secondary | ICD-10-CM | POA: Diagnosis not present

## 2017-09-21 DIAGNOSIS — D519 Vitamin B12 deficiency anemia, unspecified: Secondary | ICD-10-CM | POA: Diagnosis not present

## 2017-09-21 DIAGNOSIS — M19012 Primary osteoarthritis, left shoulder: Secondary | ICD-10-CM | POA: Diagnosis not present

## 2017-09-21 DIAGNOSIS — Z87440 Personal history of urinary (tract) infections: Secondary | ICD-10-CM | POA: Diagnosis not present

## 2017-09-21 DIAGNOSIS — M6281 Muscle weakness (generalized): Secondary | ICD-10-CM | POA: Diagnosis not present

## 2017-09-21 DIAGNOSIS — I129 Hypertensive chronic kidney disease with stage 1 through stage 4 chronic kidney disease, or unspecified chronic kidney disease: Secondary | ICD-10-CM | POA: Diagnosis not present

## 2017-09-21 DIAGNOSIS — I739 Peripheral vascular disease, unspecified: Secondary | ICD-10-CM | POA: Diagnosis not present

## 2017-09-21 DIAGNOSIS — E119 Type 2 diabetes mellitus without complications: Secondary | ICD-10-CM | POA: Diagnosis not present

## 2017-09-21 DIAGNOSIS — I6523 Occlusion and stenosis of bilateral carotid arteries: Secondary | ICD-10-CM | POA: Diagnosis not present

## 2017-09-21 DIAGNOSIS — N183 Chronic kidney disease, stage 3 (moderate): Secondary | ICD-10-CM | POA: Diagnosis not present

## 2017-09-22 ENCOUNTER — Other Ambulatory Visit: Payer: Self-pay | Admitting: Internal Medicine

## 2017-09-28 ENCOUNTER — Other Ambulatory Visit: Payer: Self-pay | Admitting: Internal Medicine

## 2017-09-28 DIAGNOSIS — I129 Hypertensive chronic kidney disease with stage 1 through stage 4 chronic kidney disease, or unspecified chronic kidney disease: Secondary | ICD-10-CM | POA: Diagnosis not present

## 2017-09-28 DIAGNOSIS — I6523 Occlusion and stenosis of bilateral carotid arteries: Secondary | ICD-10-CM | POA: Diagnosis not present

## 2017-09-28 DIAGNOSIS — I739 Peripheral vascular disease, unspecified: Secondary | ICD-10-CM | POA: Diagnosis not present

## 2017-09-28 DIAGNOSIS — D519 Vitamin B12 deficiency anemia, unspecified: Secondary | ICD-10-CM | POA: Diagnosis not present

## 2017-09-28 DIAGNOSIS — M19012 Primary osteoarthritis, left shoulder: Secondary | ICD-10-CM | POA: Diagnosis not present

## 2017-09-28 DIAGNOSIS — E119 Type 2 diabetes mellitus without complications: Secondary | ICD-10-CM | POA: Diagnosis not present

## 2017-09-28 DIAGNOSIS — Z87440 Personal history of urinary (tract) infections: Secondary | ICD-10-CM | POA: Diagnosis not present

## 2017-09-28 DIAGNOSIS — N183 Chronic kidney disease, stage 3 (moderate): Secondary | ICD-10-CM | POA: Diagnosis not present

## 2017-09-28 DIAGNOSIS — M6281 Muscle weakness (generalized): Secondary | ICD-10-CM | POA: Diagnosis not present

## 2017-09-28 MED ORDER — ALPRAZOLAM 0.25 MG PO TABS: 0.2500 mg | ORAL_TABLET | Freq: Every evening | ORAL | 2 refills | Status: DC | PRN

## 2017-10-01 DIAGNOSIS — Z87440 Personal history of urinary (tract) infections: Secondary | ICD-10-CM | POA: Diagnosis not present

## 2017-10-01 DIAGNOSIS — M19012 Primary osteoarthritis, left shoulder: Secondary | ICD-10-CM | POA: Diagnosis not present

## 2017-10-01 DIAGNOSIS — I6523 Occlusion and stenosis of bilateral carotid arteries: Secondary | ICD-10-CM | POA: Diagnosis not present

## 2017-10-01 DIAGNOSIS — I129 Hypertensive chronic kidney disease with stage 1 through stage 4 chronic kidney disease, or unspecified chronic kidney disease: Secondary | ICD-10-CM | POA: Diagnosis not present

## 2017-10-01 DIAGNOSIS — N183 Chronic kidney disease, stage 3 (moderate): Secondary | ICD-10-CM | POA: Diagnosis not present

## 2017-10-01 DIAGNOSIS — M6281 Muscle weakness (generalized): Secondary | ICD-10-CM | POA: Diagnosis not present

## 2017-10-01 DIAGNOSIS — E119 Type 2 diabetes mellitus without complications: Secondary | ICD-10-CM | POA: Diagnosis not present

## 2017-10-01 DIAGNOSIS — D519 Vitamin B12 deficiency anemia, unspecified: Secondary | ICD-10-CM | POA: Diagnosis not present

## 2017-10-01 DIAGNOSIS — I739 Peripheral vascular disease, unspecified: Secondary | ICD-10-CM | POA: Diagnosis not present

## 2017-10-05 DIAGNOSIS — M6281 Muscle weakness (generalized): Secondary | ICD-10-CM | POA: Diagnosis not present

## 2017-10-05 DIAGNOSIS — I129 Hypertensive chronic kidney disease with stage 1 through stage 4 chronic kidney disease, or unspecified chronic kidney disease: Secondary | ICD-10-CM | POA: Diagnosis not present

## 2017-10-05 DIAGNOSIS — M19012 Primary osteoarthritis, left shoulder: Secondary | ICD-10-CM | POA: Diagnosis not present

## 2017-10-05 DIAGNOSIS — I6523 Occlusion and stenosis of bilateral carotid arteries: Secondary | ICD-10-CM | POA: Diagnosis not present

## 2017-10-05 DIAGNOSIS — E119 Type 2 diabetes mellitus without complications: Secondary | ICD-10-CM | POA: Diagnosis not present

## 2017-10-05 DIAGNOSIS — D519 Vitamin B12 deficiency anemia, unspecified: Secondary | ICD-10-CM | POA: Diagnosis not present

## 2017-10-05 DIAGNOSIS — N183 Chronic kidney disease, stage 3 (moderate): Secondary | ICD-10-CM | POA: Diagnosis not present

## 2017-10-05 DIAGNOSIS — Z87440 Personal history of urinary (tract) infections: Secondary | ICD-10-CM | POA: Diagnosis not present

## 2017-10-05 DIAGNOSIS — I739 Peripheral vascular disease, unspecified: Secondary | ICD-10-CM | POA: Diagnosis not present

## 2017-10-12 ENCOUNTER — Other Ambulatory Visit: Payer: Self-pay | Admitting: Internal Medicine

## 2017-10-12 DIAGNOSIS — I739 Peripheral vascular disease, unspecified: Secondary | ICD-10-CM | POA: Diagnosis not present

## 2017-10-12 DIAGNOSIS — I6523 Occlusion and stenosis of bilateral carotid arteries: Secondary | ICD-10-CM | POA: Diagnosis not present

## 2017-10-12 DIAGNOSIS — M6281 Muscle weakness (generalized): Secondary | ICD-10-CM | POA: Diagnosis not present

## 2017-10-12 DIAGNOSIS — M19012 Primary osteoarthritis, left shoulder: Secondary | ICD-10-CM | POA: Diagnosis not present

## 2017-10-12 DIAGNOSIS — E119 Type 2 diabetes mellitus without complications: Secondary | ICD-10-CM | POA: Diagnosis not present

## 2017-10-12 DIAGNOSIS — N183 Chronic kidney disease, stage 3 (moderate): Secondary | ICD-10-CM | POA: Diagnosis not present

## 2017-10-12 DIAGNOSIS — D519 Vitamin B12 deficiency anemia, unspecified: Secondary | ICD-10-CM | POA: Diagnosis not present

## 2017-10-12 DIAGNOSIS — I129 Hypertensive chronic kidney disease with stage 1 through stage 4 chronic kidney disease, or unspecified chronic kidney disease: Secondary | ICD-10-CM | POA: Diagnosis not present

## 2017-10-12 DIAGNOSIS — Z87440 Personal history of urinary (tract) infections: Secondary | ICD-10-CM | POA: Diagnosis not present

## 2017-10-17 DIAGNOSIS — I739 Peripheral vascular disease, unspecified: Secondary | ICD-10-CM | POA: Diagnosis not present

## 2017-10-17 DIAGNOSIS — Z87440 Personal history of urinary (tract) infections: Secondary | ICD-10-CM | POA: Diagnosis not present

## 2017-10-17 DIAGNOSIS — D519 Vitamin B12 deficiency anemia, unspecified: Secondary | ICD-10-CM | POA: Diagnosis not present

## 2017-10-17 DIAGNOSIS — E119 Type 2 diabetes mellitus without complications: Secondary | ICD-10-CM | POA: Diagnosis not present

## 2017-10-17 DIAGNOSIS — M19012 Primary osteoarthritis, left shoulder: Secondary | ICD-10-CM | POA: Diagnosis not present

## 2017-10-17 DIAGNOSIS — N183 Chronic kidney disease, stage 3 (moderate): Secondary | ICD-10-CM | POA: Diagnosis not present

## 2017-10-17 DIAGNOSIS — M6281 Muscle weakness (generalized): Secondary | ICD-10-CM | POA: Diagnosis not present

## 2017-10-17 DIAGNOSIS — I129 Hypertensive chronic kidney disease with stage 1 through stage 4 chronic kidney disease, or unspecified chronic kidney disease: Secondary | ICD-10-CM | POA: Diagnosis not present

## 2017-10-17 DIAGNOSIS — I6523 Occlusion and stenosis of bilateral carotid arteries: Secondary | ICD-10-CM | POA: Diagnosis not present

## 2017-10-25 DIAGNOSIS — N183 Chronic kidney disease, stage 3 (moderate): Secondary | ICD-10-CM | POA: Diagnosis not present

## 2017-10-25 DIAGNOSIS — I129 Hypertensive chronic kidney disease with stage 1 through stage 4 chronic kidney disease, or unspecified chronic kidney disease: Secondary | ICD-10-CM | POA: Diagnosis not present

## 2017-10-25 DIAGNOSIS — Z87440 Personal history of urinary (tract) infections: Secondary | ICD-10-CM | POA: Diagnosis not present

## 2017-10-25 DIAGNOSIS — I739 Peripheral vascular disease, unspecified: Secondary | ICD-10-CM | POA: Diagnosis not present

## 2017-10-25 DIAGNOSIS — D519 Vitamin B12 deficiency anemia, unspecified: Secondary | ICD-10-CM | POA: Diagnosis not present

## 2017-10-25 DIAGNOSIS — I6523 Occlusion and stenosis of bilateral carotid arteries: Secondary | ICD-10-CM | POA: Diagnosis not present

## 2017-10-25 DIAGNOSIS — M6281 Muscle weakness (generalized): Secondary | ICD-10-CM | POA: Diagnosis not present

## 2017-10-25 DIAGNOSIS — E119 Type 2 diabetes mellitus without complications: Secondary | ICD-10-CM | POA: Diagnosis not present

## 2017-10-25 DIAGNOSIS — M19012 Primary osteoarthritis, left shoulder: Secondary | ICD-10-CM | POA: Diagnosis not present

## 2017-10-26 DIAGNOSIS — M6281 Muscle weakness (generalized): Secondary | ICD-10-CM | POA: Diagnosis not present

## 2017-10-26 DIAGNOSIS — Z87440 Personal history of urinary (tract) infections: Secondary | ICD-10-CM | POA: Diagnosis not present

## 2017-10-26 DIAGNOSIS — D519 Vitamin B12 deficiency anemia, unspecified: Secondary | ICD-10-CM | POA: Diagnosis not present

## 2017-10-26 DIAGNOSIS — I129 Hypertensive chronic kidney disease with stage 1 through stage 4 chronic kidney disease, or unspecified chronic kidney disease: Secondary | ICD-10-CM | POA: Diagnosis not present

## 2017-10-26 DIAGNOSIS — N183 Chronic kidney disease, stage 3 (moderate): Secondary | ICD-10-CM | POA: Diagnosis not present

## 2017-10-26 DIAGNOSIS — M19012 Primary osteoarthritis, left shoulder: Secondary | ICD-10-CM | POA: Diagnosis not present

## 2017-10-26 DIAGNOSIS — E119 Type 2 diabetes mellitus without complications: Secondary | ICD-10-CM | POA: Diagnosis not present

## 2017-10-26 DIAGNOSIS — I6523 Occlusion and stenosis of bilateral carotid arteries: Secondary | ICD-10-CM | POA: Diagnosis not present

## 2017-10-26 DIAGNOSIS — I739 Peripheral vascular disease, unspecified: Secondary | ICD-10-CM | POA: Diagnosis not present

## 2017-11-01 DIAGNOSIS — I739 Peripheral vascular disease, unspecified: Secondary | ICD-10-CM | POA: Diagnosis not present

## 2017-11-01 DIAGNOSIS — I6523 Occlusion and stenosis of bilateral carotid arteries: Secondary | ICD-10-CM | POA: Diagnosis not present

## 2017-11-01 DIAGNOSIS — N183 Chronic kidney disease, stage 3 (moderate): Secondary | ICD-10-CM | POA: Diagnosis not present

## 2017-11-01 DIAGNOSIS — E119 Type 2 diabetes mellitus without complications: Secondary | ICD-10-CM | POA: Diagnosis not present

## 2017-11-01 DIAGNOSIS — I129 Hypertensive chronic kidney disease with stage 1 through stage 4 chronic kidney disease, or unspecified chronic kidney disease: Secondary | ICD-10-CM | POA: Diagnosis not present

## 2017-11-01 DIAGNOSIS — M19012 Primary osteoarthritis, left shoulder: Secondary | ICD-10-CM | POA: Diagnosis not present

## 2017-11-01 DIAGNOSIS — D519 Vitamin B12 deficiency anemia, unspecified: Secondary | ICD-10-CM | POA: Diagnosis not present

## 2017-11-01 DIAGNOSIS — Z87440 Personal history of urinary (tract) infections: Secondary | ICD-10-CM | POA: Diagnosis not present

## 2017-11-01 DIAGNOSIS — M6281 Muscle weakness (generalized): Secondary | ICD-10-CM | POA: Diagnosis not present

## 2017-11-05 DIAGNOSIS — N183 Chronic kidney disease, stage 3 (moderate): Secondary | ICD-10-CM | POA: Diagnosis not present

## 2017-11-05 DIAGNOSIS — I129 Hypertensive chronic kidney disease with stage 1 through stage 4 chronic kidney disease, or unspecified chronic kidney disease: Secondary | ICD-10-CM | POA: Diagnosis not present

## 2017-11-05 DIAGNOSIS — Z87440 Personal history of urinary (tract) infections: Secondary | ICD-10-CM | POA: Diagnosis not present

## 2017-11-05 DIAGNOSIS — D519 Vitamin B12 deficiency anemia, unspecified: Secondary | ICD-10-CM | POA: Diagnosis not present

## 2017-11-05 DIAGNOSIS — I739 Peripheral vascular disease, unspecified: Secondary | ICD-10-CM | POA: Diagnosis not present

## 2017-11-05 DIAGNOSIS — E119 Type 2 diabetes mellitus without complications: Secondary | ICD-10-CM | POA: Diagnosis not present

## 2017-11-05 DIAGNOSIS — M6281 Muscle weakness (generalized): Secondary | ICD-10-CM | POA: Diagnosis not present

## 2017-11-05 DIAGNOSIS — M19012 Primary osteoarthritis, left shoulder: Secondary | ICD-10-CM | POA: Diagnosis not present

## 2017-11-05 DIAGNOSIS — I6523 Occlusion and stenosis of bilateral carotid arteries: Secondary | ICD-10-CM | POA: Diagnosis not present

## 2017-11-07 ENCOUNTER — Other Ambulatory Visit: Payer: Self-pay | Admitting: Internal Medicine

## 2017-11-07 DIAGNOSIS — I6529 Occlusion and stenosis of unspecified carotid artery: Secondary | ICD-10-CM | POA: Diagnosis not present

## 2017-11-07 DIAGNOSIS — R29898 Other symptoms and signs involving the musculoskeletal system: Secondary | ICD-10-CM | POA: Diagnosis not present

## 2017-11-07 DIAGNOSIS — R531 Weakness: Secondary | ICD-10-CM | POA: Diagnosis not present

## 2017-11-07 DIAGNOSIS — R600 Localized edema: Secondary | ICD-10-CM | POA: Diagnosis not present

## 2017-11-07 DIAGNOSIS — I10 Essential (primary) hypertension: Secondary | ICD-10-CM | POA: Diagnosis not present

## 2017-11-07 DIAGNOSIS — M1712 Unilateral primary osteoarthritis, left knee: Secondary | ICD-10-CM | POA: Diagnosis not present

## 2017-11-07 DIAGNOSIS — M25552 Pain in left hip: Secondary | ICD-10-CM | POA: Diagnosis not present

## 2017-11-07 DIAGNOSIS — S8992XA Unspecified injury of left lower leg, initial encounter: Secondary | ICD-10-CM | POA: Diagnosis not present

## 2017-11-07 DIAGNOSIS — D649 Anemia, unspecified: Secondary | ICD-10-CM | POA: Diagnosis not present

## 2017-11-07 DIAGNOSIS — M25562 Pain in left knee: Secondary | ICD-10-CM | POA: Diagnosis not present

## 2017-11-07 DIAGNOSIS — Z87891 Personal history of nicotine dependence: Secondary | ICD-10-CM | POA: Diagnosis not present

## 2017-11-07 DIAGNOSIS — Z96642 Presence of left artificial hip joint: Secondary | ICD-10-CM | POA: Diagnosis not present

## 2017-11-07 DIAGNOSIS — Z882 Allergy status to sulfonamides status: Secondary | ICD-10-CM | POA: Diagnosis not present

## 2017-11-08 DIAGNOSIS — N183 Chronic kidney disease, stage 3 (moderate): Secondary | ICD-10-CM | POA: Diagnosis not present

## 2017-11-08 DIAGNOSIS — I6523 Occlusion and stenosis of bilateral carotid arteries: Secondary | ICD-10-CM | POA: Diagnosis not present

## 2017-11-08 DIAGNOSIS — I129 Hypertensive chronic kidney disease with stage 1 through stage 4 chronic kidney disease, or unspecified chronic kidney disease: Secondary | ICD-10-CM | POA: Diagnosis not present

## 2017-11-08 DIAGNOSIS — Z87440 Personal history of urinary (tract) infections: Secondary | ICD-10-CM | POA: Diagnosis not present

## 2017-11-08 DIAGNOSIS — I739 Peripheral vascular disease, unspecified: Secondary | ICD-10-CM | POA: Diagnosis not present

## 2017-11-08 DIAGNOSIS — E119 Type 2 diabetes mellitus without complications: Secondary | ICD-10-CM | POA: Diagnosis not present

## 2017-11-08 DIAGNOSIS — M6281 Muscle weakness (generalized): Secondary | ICD-10-CM | POA: Diagnosis not present

## 2017-11-08 DIAGNOSIS — M19012 Primary osteoarthritis, left shoulder: Secondary | ICD-10-CM | POA: Diagnosis not present

## 2017-11-08 DIAGNOSIS — D519 Vitamin B12 deficiency anemia, unspecified: Secondary | ICD-10-CM | POA: Diagnosis not present

## 2017-11-09 ENCOUNTER — Encounter: Payer: Self-pay | Admitting: Nurse Practitioner

## 2017-11-09 ENCOUNTER — Ambulatory Visit (INDEPENDENT_AMBULATORY_CARE_PROVIDER_SITE_OTHER): Payer: Medicare HMO | Admitting: Nurse Practitioner

## 2017-11-09 VITALS — BP 160/68 | HR 92 | Resp 16 | Ht 62.0 in | Wt 161.0 lb

## 2017-11-09 DIAGNOSIS — N390 Urinary tract infection, site not specified: Secondary | ICD-10-CM | POA: Diagnosis not present

## 2017-11-09 DIAGNOSIS — I10 Essential (primary) hypertension: Secondary | ICD-10-CM

## 2017-11-09 DIAGNOSIS — R300 Dysuria: Secondary | ICD-10-CM

## 2017-11-09 DIAGNOSIS — L209 Atopic dermatitis, unspecified: Secondary | ICD-10-CM

## 2017-11-09 DIAGNOSIS — M25562 Pain in left knee: Secondary | ICD-10-CM | POA: Diagnosis not present

## 2017-11-09 DIAGNOSIS — R319 Hematuria, unspecified: Secondary | ICD-10-CM | POA: Diagnosis not present

## 2017-11-09 LAB — POCT URINALYSIS DIPSTICK
BILIRUBIN UA: NEGATIVE
Glucose, UA: NEGATIVE
KETONES UA: NEGATIVE
NITRITE UA: NEGATIVE
PH UA: 6 (ref 5.0–8.0)
PROTEIN UA: 30
Spec Grav, UA: 1.01 (ref 1.010–1.025)
UROBILINOGEN UA: 0.2 U/dL

## 2017-11-09 MED ORDER — TRIAMCINOLONE ACETONIDE 0.025 % EX CREA: 1.0000 "application " | TOPICAL_CREAM | Freq: Two times a day (BID) | CUTANEOUS | 2 refills | Status: AC

## 2017-11-09 MED ORDER — NITROFURANTOIN MONOHYD MACRO 100 MG PO CAPS: 100.0000 mg | ORAL_CAPSULE | Freq: Two times a day (BID) | ORAL | 0 refills | Status: DC

## 2017-11-09 NOTE — Progress Notes (Signed)
[facility] [place], [place] [address]  Internal MEDICINE  Office Visit Note  Patient Name: [name]  [id number]  [id number]  Date of Service: 12/05/2017  Pt is here for recent hospital follow up.   Chief Complaint  Patient presents with  . Follow-up    er visit  . Fall    hard to stand due to fall bruise on left arm  . Urinary Tract Infection     The patient fell this past Saturday evening at home. Foot 'got caught" on carpet, knee twisted, causing her to fall. She was taken to the ER 3 days later. X-ray of the left knee did show moderate arthritis. It was recommended that a tendon ultrasound be performed to make sure there was no tendon rupture or other injury. The knee and hip continue to heal. Pain is getting better, thogh still rather swollen and sore. ROM and strength are both limited. She is having a lot of trouble going up and down the three stairs to her room at home. She also fell onto her left arm and elbow. There was a watch on the arm, which caused a significant amount of bruising. No fractures or other bony abnormalities were noted.  Has dry and flaky skin on the earlobe of left ear. Itches sometimes. Patient states that this is like this all the time.     Current Medication: Outpatient Encounter Medications as of 11/09/2017  Medication Sig Note  . ALPRAZolam (XANAX) 0.25 MG tablet Take 1 tablet (0.25 mg total) by mouth at bedtime as needed for anxiety.   Marland Kitchen amLODipine (NORVASC) 2.5 MG tablet take 1 tablet by mouth at bedtime   . amoxicillin (AMOXIL) 875 MG tablet Take 1 tablet (875 mg total) by mouth 2 (two) times daily.   . Cholecalciferol (VITAMIN D3) 1000 units CAPS Take by mouth daily.   . cloNIDine (CATAPRES - DOSED IN MG/24 HR) 0.2 mg/24hr patch  06/20/2016: Received from: External Pharmacy  . cloNIDine (CATAPRES - DOSED IN MG/24 HR) 0.2 mg/24hr patch apply 1 patch every week   . conjugated estrogens (PREMARIN) vaginal cream Place  1 Applicatorful vaginally once a week. At night.   . fluticasone (FLONASE) 50 MCG/ACT nasal spray Place 1 spray into both nostrils daily.   . naproxen (NAPROSYN) 250 MG tablet Take 250 mg by mouth 2 (two) times daily. Take 1-2 tablets BID.   Marland Kitchen nitrofurantoin, macrocrystal-monohydrate, (MACROBID) 100 MG capsule Take 1 capsule (100 mg total) by mouth 2 (two) times daily.   Marland Kitchen omeprazole (PRILOSEC) 20 MG capsule Take 20 mg by mouth daily.  06/20/2016: Received from: External Pharmacy  . omeprazole (PRILOSEC) 20 MG capsule take 1 capsule by mouth once daily FOR BELCHING AND STOMACH.   . pravastatin (PRAVACHOL) 20 MG tablet Take 20 mg by mouth daily.  06/20/2016: Received from: External Pharmacy  . triamcinolone (KENALOG) 0.025 % cream Apply 1 application topically 2 (two) times daily.   . VOLTAREN 1 % GEL apply 2 grams to affected area twice a day   . [DISCONTINUED] allopurinol (ZYLOPRIM) 100 MG tablet  06/20/2016: Received from: External Pharmacy  . [DISCONTINUED] benazepril (LOTENSIN) 5 MG tablet  06/20/2016: Received from: External Pharmacy  . [DISCONTINUED] clopidogrel (PLAVIX) 75 MG tablet Take 75 mg by mouth daily.  06/20/2016: Received from: External Pharmacy  . [DISCONTINUED] ferrous sulfate 325 (65 FE) MG tablet Take 325 mg by mouth 2 (two) times daily.   . [DISCONTINUED] triamcinolone cream (KENALOG) 0.1 %  Apply 1 application topically 2 (two) times daily.    No facility-administered encounter medications on file as of 11/09/2017.     Surgical History: Past Surgical History:  Procedure Laterality Date  . APPENDECTOMY    . BACK SURGERY    . CAROTID ENDARTERECTOMY Right   . CHOLECYSTECTOMY    . EYE SURGERY Bilateral   . FOOT SURGERY Left   . HERNIA REPAIR    . HIP SURGERY Left   . TONSILLECTOMY      Medical History: Past Medical History:  Diagnosis Date  . Allergy   . Arthritis   . CKD (chronic kidney disease), stage III ([place])   . GERD (gastroesophageal reflux disease)   .  Hyperlipidemia   . Hypertension   . Insomnia   . Stroke [facility])     Family History: Family History  Problem Relation Age of Onset  . Congestive Heart Failure Mother   . Heart disease Mother   . Heart disease Father   . Diabetes Brother     Social History   Socioeconomic History  . Marital status: Widowed    Spouse name: Not on file  . Number of children: Not on file  . Years of education: Not on file  . Highest education level: Not on file  Occupational History  . Not on file  Social Needs  . Financial resource strain: Not on file  . Food insecurity:    Worry: Not on file    Inability: Not on file  . Transportation needs:    Medical: Not on file    Non-medical: Not on file  Tobacco Use  . Smoking status: Never Smoker  . Smokeless tobacco: Never Used  Substance and Sexual Activity  . Alcohol use: No  . Drug use: No  . Sexual activity: Not on file  Lifestyle  . Physical activity:    Days per week: Not on file    Minutes per session: Not on file  . Stress: Not on file  Relationships  . Social connections:    Talks on phone: Not on file    Gets together: Not on file    Attends religious service: Not on file    Active member of club or organization: Not on file    Attends meetings of clubs or organizations: Not on file    Relationship status: Not on file  . Intimate partner violence:    Fear of current or ex partner: Not on file    Emotionally abused: Not on file    Physically abused: Not on file    Forced sexual activity: Not on file  Other Topics Concern  . Not on file  Social History Narrative  . Not on file      Review of Systems  Constitutional: Positive for activity change. Negative for chills, fatigue and unexpected weight change.  HENT: Negative for ear pain, postnasal drip, rhinorrhea, sinus pressure, sinus pain and sore throat.   Eyes: Negative.   Respiratory: Negative for chest tightness, shortness of breath and wheezing.   Cardiovascular:  Negative for chest pain, palpitations and leg swelling.       BP elevated today.  Gastrointestinal: Negative for constipation, diarrhea, nausea and vomiting.  Endocrine: Negative for cold intolerance, heat intolerance, polydipsia, polyphagia and polyuria.  Genitourinary: Positive for dysuria, flank pain and frequency.  Musculoskeletal: Positive for arthralgias and back pain.  Skin: Negative for rash.  Allergic/Immunologic: Negative for environmental allergies.  Neurological: Positive for weakness. Negative for dizziness and  headaches.  Hematological: Negative for adenopathy.  Psychiatric/Behavioral: Negative for dysphoric mood. The patient is not nervous/anxious.    Today's Vitals   11/09/17 1418  BP: (!) 160/68  Pulse: 92  Resp: 16  SpO2: 98%  Weight: 161 lb (73 kg)  Height: 5\' 2"  (1.575 m)    Physical Exam  Constitutional: She is oriented to person, place, and time. She appears well-developed and well-nourished. No distress.  HENT:  Head: Normocephalic and atraumatic.  Mouth/Throat: Oropharynx is clear and moist. No oropharyngeal exudate.  Eyes: Pupils are equal, round, and reactive to light. EOM are normal.  Neck: Normal range of motion. Neck supple. No JVD present. Carotid bruit is not present. No tracheal deviation present. No thyromegaly present.  Cardiovascular: Normal rate, regular rhythm, normal heart sounds and intact distal pulses. Exam reveals no gallop and no friction rub.  No murmur heard. Pulmonary/Chest: Effort normal and breath sounds normal. No respiratory distress. She has no wheezes. She has no rales. She exhibits no tenderness.  Abdominal: Soft. Bowel sounds are normal. There is no tenderness.  Genitourinary:  Genitourinary Comments: Urine sample positive for moderate WBC and trace blood.   Musculoskeletal:       Legs: Lymphadenopathy:    She has no cervical adenopathy.  Neurological: She is alert and oriented to person, place, and time. No cranial nerve  deficit.  Skin: Skin is warm and dry. She is not diaphoretic.  Psychiatric: She has a normal mood and affect. Her behavior is normal. Judgment and thought content normal.  Nursing note and vitals reviewed.  Assessment/Plan: 1. Acute pain of left knee Reviewed ER records, showing no acute abnormalities of left knee. Will get u/s left lower leg to evaluate for tendon rupture, as suggested. Continue to rest, ice, and elevate the knee when possible. Refer to orthopedics as indicated.  - [place] LT LOWER EXTREM LTD SOFT TISSUE NON VASCULAR; Future  2. Urinary tract infection with hematuria, site unspecified macrobid 100mg  bid for 10 days. Adjust as indicated based on results of culture and sensitivity.  - CULTURE, URINE COMPREHENSIVE - nitrofurantoin, macrocrystal-monohydrate, (MACROBID) 100 MG capsule; Take 1 capsule (100 mg total) by mouth 2 (two) times daily.  Dispense: 20 capsule; Refill: 0  3. Dysuria - POCT Urinalysis Dipstick positive for moderate WBC and trace blood.   4. Atopic dermatitis, unspecified type - triamcinolone (KENALOG) 0.025 % cream; Apply 1 application topically 2 (two) times daily.  Dispense: 80 g; Refill: 2  5. Essential hypertension, benign Generally stable. Continue bp medication as prescribed.   General Counseling: [name] verbalizes understanding of the findings of todays visit and agrees with plan of treatment. I have discussed any further diagnostic evaluation that may be needed or ordered today. We also reviewed her medications today. she has been encouraged to call the office with any questions or concerns that should arise related to todays visit.  Apply a compressive ACE bandage. Rest and elevate the affected painful area.  Apply cold compresses intermittently as needed.  As pain recedes, begin normal activities slowly as tolerated.  Call if symptoms persist.  This patient was seen by Leretha Pol, FNP- C in Collaboration with Dr Lavera Guise as a part of  collaborative care agreement    Orders Placed This Encounter  Procedures  . CULTURE, URINE COMPREHENSIVE  . [place] LT LOWER EXTREM LTD SOFT TISSUE NON VASCULAR  . POCT Urinalysis Dipstick    I have reviewed all medical records from hospital follow up including radiology reports  and consults from other physicians. Appropriate follow up diagnostics will be scheduled as needed. Patient/ Family understands the plan of treatment. Time spent 25 minutes.   Dr Lavera Guise, MD Internal Medicine

## 2017-11-11 ENCOUNTER — Other Ambulatory Visit: Payer: Self-pay | Admitting: Internal Medicine

## 2017-11-12 DIAGNOSIS — N183 Chronic kidney disease, stage 3 (moderate): Secondary | ICD-10-CM | POA: Diagnosis not present

## 2017-11-12 DIAGNOSIS — D519 Vitamin B12 deficiency anemia, unspecified: Secondary | ICD-10-CM | POA: Diagnosis not present

## 2017-11-12 DIAGNOSIS — Z87440 Personal history of urinary (tract) infections: Secondary | ICD-10-CM | POA: Diagnosis not present

## 2017-11-12 DIAGNOSIS — I739 Peripheral vascular disease, unspecified: Secondary | ICD-10-CM | POA: Diagnosis not present

## 2017-11-12 DIAGNOSIS — M19012 Primary osteoarthritis, left shoulder: Secondary | ICD-10-CM | POA: Diagnosis not present

## 2017-11-12 DIAGNOSIS — I129 Hypertensive chronic kidney disease with stage 1 through stage 4 chronic kidney disease, or unspecified chronic kidney disease: Secondary | ICD-10-CM | POA: Diagnosis not present

## 2017-11-12 DIAGNOSIS — E119 Type 2 diabetes mellitus without complications: Secondary | ICD-10-CM | POA: Diagnosis not present

## 2017-11-13 ENCOUNTER — Other Ambulatory Visit: Payer: Self-pay | Admitting: Internal Medicine

## 2017-11-13 LAB — CULTURE, URINE COMPREHENSIVE

## 2017-11-16 DIAGNOSIS — N183 Chronic kidney disease, stage 3 (moderate): Secondary | ICD-10-CM | POA: Diagnosis not present

## 2017-11-16 DIAGNOSIS — M19012 Primary osteoarthritis, left shoulder: Secondary | ICD-10-CM | POA: Diagnosis not present

## 2017-11-16 DIAGNOSIS — E119 Type 2 diabetes mellitus without complications: Secondary | ICD-10-CM | POA: Diagnosis not present

## 2017-11-16 DIAGNOSIS — Z87440 Personal history of urinary (tract) infections: Secondary | ICD-10-CM | POA: Diagnosis not present

## 2017-11-16 DIAGNOSIS — D519 Vitamin B12 deficiency anemia, unspecified: Secondary | ICD-10-CM | POA: Diagnosis not present

## 2017-11-16 DIAGNOSIS — I739 Peripheral vascular disease, unspecified: Secondary | ICD-10-CM | POA: Diagnosis not present

## 2017-11-16 DIAGNOSIS — I129 Hypertensive chronic kidney disease with stage 1 through stage 4 chronic kidney disease, or unspecified chronic kidney disease: Secondary | ICD-10-CM | POA: Diagnosis not present

## 2017-11-20 ENCOUNTER — Telehealth: Payer: Self-pay

## 2017-11-20 ENCOUNTER — Other Ambulatory Visit: Payer: Self-pay | Admitting: Internal Medicine

## 2017-11-20 NOTE — Progress Notes (Signed)
Patient was put on macrobid due to uti

## 2017-11-20 NOTE — Telephone Encounter (Signed)
Patient was advised that she is scheduled for [place] lower ext on 11/26/17 @ 10:15 [place]; per patient's request I called and canceled the procedure; patient states she feels better.Titania

## 2017-11-21 DIAGNOSIS — E559 Vitamin D deficiency, unspecified: Secondary | ICD-10-CM | POA: Diagnosis not present

## 2017-11-21 DIAGNOSIS — I6523 Occlusion and stenosis of bilateral carotid arteries: Secondary | ICD-10-CM | POA: Diagnosis not present

## 2017-11-21 DIAGNOSIS — Z0001 Encounter for general adult medical examination with abnormal findings: Secondary | ICD-10-CM | POA: Diagnosis not present

## 2017-11-21 DIAGNOSIS — E039 Hypothyroidism, unspecified: Secondary | ICD-10-CM | POA: Diagnosis not present

## 2017-11-22 ENCOUNTER — Telehealth: Payer: Self-pay

## 2017-11-22 NOTE — Telephone Encounter (Signed)
Spoke to pt

## 2017-11-23 ENCOUNTER — Other Ambulatory Visit: Payer: Self-pay | Admitting: Nurse Practitioner

## 2017-11-23 ENCOUNTER — Telehealth: Payer: Self-pay

## 2017-11-23 DIAGNOSIS — E119 Type 2 diabetes mellitus without complications: Secondary | ICD-10-CM | POA: Diagnosis not present

## 2017-11-23 DIAGNOSIS — N390 Urinary tract infection, site not specified: Secondary | ICD-10-CM

## 2017-11-23 DIAGNOSIS — N183 Chronic kidney disease, stage 3 (moderate): Secondary | ICD-10-CM | POA: Diagnosis not present

## 2017-11-23 DIAGNOSIS — I739 Peripheral vascular disease, unspecified: Secondary | ICD-10-CM | POA: Diagnosis not present

## 2017-11-23 DIAGNOSIS — D519 Vitamin B12 deficiency anemia, unspecified: Secondary | ICD-10-CM | POA: Diagnosis not present

## 2017-11-23 DIAGNOSIS — Z87440 Personal history of urinary (tract) infections: Secondary | ICD-10-CM | POA: Diagnosis not present

## 2017-11-23 DIAGNOSIS — I129 Hypertensive chronic kidney disease with stage 1 through stage 4 chronic kidney disease, or unspecified chronic kidney disease: Secondary | ICD-10-CM | POA: Diagnosis not present

## 2017-11-23 DIAGNOSIS — M19012 Primary osteoarthritis, left shoulder: Secondary | ICD-10-CM | POA: Diagnosis not present

## 2017-11-23 MED ORDER — CIPROFLOXACIN HCL 500 MG PO TABS: 500.0000 mg | ORAL_TABLET | Freq: Two times a day (BID) | ORAL | 0 refills | Status: DC

## 2017-11-23 NOTE — Telephone Encounter (Signed)
Add additional 7 days ciprofloxacin 500mg  bid. Sent to pharmacy.

## 2017-11-23 NOTE — Telephone Encounter (Signed)
Pt informed that we added another round of cipro and sent to her pharmacy.  dbs

## 2017-11-23 NOTE — Progress Notes (Signed)
Add additional 7 days ciprofloxacin 500mg  bid. Sent to pharmacy.

## 2017-11-26 ENCOUNTER — Ambulatory Visit: Payer: Commercial Managed Care - HMO

## 2017-11-26 LAB — CBC WITH DIFFERENTIAL/PLATELET
Basophils Absolute: 0.1 10*3/uL (ref 0.0–0.2)
Basos: 1 %
EOS (ABSOLUTE): 0.2 10*3/uL (ref 0.0–0.4)
Eos: 2 %
Hematocrit: 31.5 % — ABNORMAL LOW (ref 34.0–46.6)
Hemoglobin: 10.1 g/dL — ABNORMAL LOW (ref 11.1–15.9)
IMMATURE GRANULOCYTES: 0 %
Immature Grans (Abs): 0 10*3/uL (ref 0.0–0.1)
Lymphocytes Absolute: 4.8 10*3/uL — ABNORMAL HIGH (ref 0.7–3.1)
Lymphs: 37 %
MCH: 31.4 pg (ref 26.6–33.0)
MCHC: 32.1 g/dL (ref 31.5–35.7)
MCV: 98 fL — AB (ref 79–97)
MONOS ABS: 0.6 10*3/uL (ref 0.1–0.9)
Monocytes: 5 %
NEUTROS PCT: 55 %
Neutrophils Absolute: 7.3 10*3/uL — ABNORMAL HIGH (ref 1.4–7.0)
PLATELETS: 339 10*3/uL (ref 150–379)
RBC: 3.22 x10E6/uL — AB (ref 3.77–5.28)
RDW: 14.4 % (ref 12.3–15.4)
WBC: 13.1 10*3/uL — AB (ref 3.4–10.8)

## 2017-11-26 LAB — T4, FREE: FREE T4: 1.04 ng/dL (ref 0.82–1.77)

## 2017-11-26 LAB — COMPREHENSIVE METABOLIC PANEL
A/G RATIO: 1.7 (ref 1.2–2.2)
ALK PHOS: 119 IU/L — AB (ref 39–117)
ALT: 14 IU/L (ref 0–32)
AST: 23 IU/L (ref 0–40)
Albumin: 4.3 g/dL (ref 3.5–4.7)
BUN/Creatinine Ratio: 26 (ref 12–28)
BUN: 35 mg/dL — ABNORMAL HIGH (ref 8–27)
Bilirubin Total: <0.2 mg/dL (ref 0.0–1.2)
CALCIUM: 9.9 mg/dL (ref 8.7–10.3)
CO2: 23 mmol/L (ref 20–29)
Chloride: 99 mmol/L (ref 96–106)
Creatinine, Ser: 1.34 mg/dL — ABNORMAL HIGH (ref 0.57–1.00)
GFR calc Af Amer: 41 mL/min/{1.73_m2} — ABNORMAL LOW (ref >59)
GFR, EST NON AFRICAN AMERICAN: 35 mL/min/{1.73_m2} — AB (ref >59)
Globulin, Total: 2.6 g/dL (ref 1.5–4.5)
Glucose: 105 mg/dL — ABNORMAL HIGH (ref 65–99)
POTASSIUM: 4.9 mmol/L (ref 3.5–5.2)
Sodium: 138 mmol/L (ref 134–144)
Total Protein: 6.9 g/dL (ref 6.0–8.5)

## 2017-11-26 LAB — LIPID PANEL
CHOLESTEROL TOTAL: 166 mg/dL (ref 100–199)
Chol/HDL Ratio: 3.7 ratio (ref 0.0–4.4)
HDL: 45 mg/dL (ref >39)
LDL Calculated: 85 mg/dL (ref 0–99)
Triglycerides: 180 mg/dL — ABNORMAL HIGH (ref 0–149)
VLDL CHOLESTEROL CAL: 36 mg/dL (ref 5–40)

## 2017-11-26 LAB — VITAMIN D 1,25 DIHYDROXY
VITAMIN D3 1, 25 (OH): 23 pg/mL
Vitamin D 1, 25 (OH)2 Total: 23 pg/mL

## 2017-11-26 LAB — TSH: TSH: 2.97 u[IU]/mL (ref 0.450–4.500)

## 2017-11-27 DIAGNOSIS — N183 Chronic kidney disease, stage 3 (moderate): Secondary | ICD-10-CM | POA: Diagnosis not present

## 2017-11-27 DIAGNOSIS — D519 Vitamin B12 deficiency anemia, unspecified: Secondary | ICD-10-CM | POA: Diagnosis not present

## 2017-11-27 DIAGNOSIS — Z87440 Personal history of urinary (tract) infections: Secondary | ICD-10-CM | POA: Diagnosis not present

## 2017-11-27 DIAGNOSIS — I129 Hypertensive chronic kidney disease with stage 1 through stage 4 chronic kidney disease, or unspecified chronic kidney disease: Secondary | ICD-10-CM | POA: Diagnosis not present

## 2017-11-27 DIAGNOSIS — E119 Type 2 diabetes mellitus without complications: Secondary | ICD-10-CM | POA: Diagnosis not present

## 2017-11-27 DIAGNOSIS — I739 Peripheral vascular disease, unspecified: Secondary | ICD-10-CM | POA: Diagnosis not present

## 2017-11-27 DIAGNOSIS — M19012 Primary osteoarthritis, left shoulder: Secondary | ICD-10-CM | POA: Diagnosis not present

## 2017-11-30 DIAGNOSIS — Z87440 Personal history of urinary (tract) infections: Secondary | ICD-10-CM | POA: Diagnosis not present

## 2017-11-30 DIAGNOSIS — I129 Hypertensive chronic kidney disease with stage 1 through stage 4 chronic kidney disease, or unspecified chronic kidney disease: Secondary | ICD-10-CM | POA: Diagnosis not present

## 2017-11-30 DIAGNOSIS — N183 Chronic kidney disease, stage 3 (moderate): Secondary | ICD-10-CM | POA: Diagnosis not present

## 2017-11-30 DIAGNOSIS — M19012 Primary osteoarthritis, left shoulder: Secondary | ICD-10-CM | POA: Diagnosis not present

## 2017-11-30 DIAGNOSIS — D519 Vitamin B12 deficiency anemia, unspecified: Secondary | ICD-10-CM | POA: Diagnosis not present

## 2017-11-30 DIAGNOSIS — E119 Type 2 diabetes mellitus without complications: Secondary | ICD-10-CM | POA: Diagnosis not present

## 2017-11-30 DIAGNOSIS — I739 Peripheral vascular disease, unspecified: Secondary | ICD-10-CM | POA: Diagnosis not present

## 2017-12-05 DIAGNOSIS — R300 Dysuria: Secondary | ICD-10-CM | POA: Insufficient documentation

## 2017-12-05 DIAGNOSIS — R319 Hematuria, unspecified: Secondary | ICD-10-CM

## 2017-12-05 DIAGNOSIS — N390 Urinary tract infection, site not specified: Secondary | ICD-10-CM | POA: Insufficient documentation

## 2017-12-05 DIAGNOSIS — M25562 Pain in left knee: Secondary | ICD-10-CM | POA: Insufficient documentation

## 2017-12-05 DIAGNOSIS — L209 Atopic dermatitis, unspecified: Secondary | ICD-10-CM | POA: Insufficient documentation

## 2017-12-07 DIAGNOSIS — N183 Chronic kidney disease, stage 3 (moderate): Secondary | ICD-10-CM | POA: Diagnosis not present

## 2017-12-07 DIAGNOSIS — I739 Peripheral vascular disease, unspecified: Secondary | ICD-10-CM | POA: Diagnosis not present

## 2017-12-07 DIAGNOSIS — E119 Type 2 diabetes mellitus without complications: Secondary | ICD-10-CM | POA: Diagnosis not present

## 2017-12-07 DIAGNOSIS — M19012 Primary osteoarthritis, left shoulder: Secondary | ICD-10-CM | POA: Diagnosis not present

## 2017-12-07 DIAGNOSIS — Z87440 Personal history of urinary (tract) infections: Secondary | ICD-10-CM | POA: Diagnosis not present

## 2017-12-07 DIAGNOSIS — I129 Hypertensive chronic kidney disease with stage 1 through stage 4 chronic kidney disease, or unspecified chronic kidney disease: Secondary | ICD-10-CM | POA: Diagnosis not present

## 2017-12-07 DIAGNOSIS — D519 Vitamin B12 deficiency anemia, unspecified: Secondary | ICD-10-CM | POA: Diagnosis not present

## 2017-12-11 DIAGNOSIS — E119 Type 2 diabetes mellitus without complications: Secondary | ICD-10-CM | POA: Diagnosis not present

## 2017-12-11 DIAGNOSIS — I739 Peripheral vascular disease, unspecified: Secondary | ICD-10-CM | POA: Diagnosis not present

## 2017-12-11 DIAGNOSIS — Z87440 Personal history of urinary (tract) infections: Secondary | ICD-10-CM | POA: Diagnosis not present

## 2017-12-11 DIAGNOSIS — I129 Hypertensive chronic kidney disease with stage 1 through stage 4 chronic kidney disease, or unspecified chronic kidney disease: Secondary | ICD-10-CM | POA: Diagnosis not present

## 2017-12-11 DIAGNOSIS — N183 Chronic kidney disease, stage 3 (moderate): Secondary | ICD-10-CM | POA: Diagnosis not present

## 2017-12-11 DIAGNOSIS — M19012 Primary osteoarthritis, left shoulder: Secondary | ICD-10-CM | POA: Diagnosis not present

## 2017-12-11 DIAGNOSIS — D519 Vitamin B12 deficiency anemia, unspecified: Secondary | ICD-10-CM | POA: Diagnosis not present

## 2017-12-13 ENCOUNTER — Other Ambulatory Visit: Payer: Self-pay | Admitting: Internal Medicine

## 2017-12-13 NOTE — Telephone Encounter (Signed)
Can you review and send please

## 2017-12-14 ENCOUNTER — Ambulatory Visit: Payer: Self-pay | Admitting: Nurse Practitioner

## 2017-12-18 ENCOUNTER — Ambulatory Visit (INDEPENDENT_AMBULATORY_CARE_PROVIDER_SITE_OTHER): Payer: Medicare HMO | Admitting: Nurse Practitioner

## 2017-12-18 ENCOUNTER — Encounter: Payer: Self-pay | Admitting: Nurse Practitioner

## 2017-12-18 VITALS — BP 160/65 | HR 89 | Resp 16 | Ht 62.0 in | Wt 163.0 lb

## 2017-12-18 DIAGNOSIS — N390 Urinary tract infection, site not specified: Secondary | ICD-10-CM

## 2017-12-18 DIAGNOSIS — M150 Primary generalized (osteo)arthritis: Secondary | ICD-10-CM

## 2017-12-18 DIAGNOSIS — I10 Essential (primary) hypertension: Secondary | ICD-10-CM

## 2017-12-18 DIAGNOSIS — D519 Vitamin B12 deficiency anemia, unspecified: Secondary | ICD-10-CM | POA: Diagnosis not present

## 2017-12-18 DIAGNOSIS — R319 Hematuria, unspecified: Secondary | ICD-10-CM | POA: Diagnosis not present

## 2017-12-18 NOTE — Progress Notes (Signed)
[facility], [place] [address]  Internal MEDICINE  Office Visit Note  Patient Name: [name]  [id number]  [id number]  Date of Service: 01/09/2018   Pt is here for routine follow up.    Chief Complaint  Patient presents with  . Ear Pain    left   . Knee Pain    follow up from fall     Prior to her last visit, the patient had fallen, hurting her hip and knee. She has been doing physical therapy. She is able to get up and down the stairs with very little assistance. Pain is improving.  Has some pain in left ear, mostly when laying down on the left side. Has used her ear wax removal and ear pain relieving drops. Pain has improved. Just bothering her a little at night now.      Current Medication: Outpatient Encounter Medications as of 12/18/2017  Medication Sig Note  . allopurinol (ZYLOPRIM) 100 MG tablet take 2 tablets by mouth at bedtime for gout   . ALPRAZolam (XANAX) 0.25 MG tablet take 1 tablet by mouth at bedtime if needed for anxiety   . amLODipine (NORVASC) 2.5 MG tablet take 1 tablet by mouth at bedtime   . amoxicillin (AMOXIL) 875 MG tablet Take 1 tablet (875 mg total) by mouth 2 (two) times daily.   . benazepril (LOTENSIN) 5 MG tablet take 1 tablet by mouth twice a day   . Cholecalciferol (VITAMIN D3) 1000 units CAPS Take by mouth daily.   . ciprofloxacin (CIPRO) 500 MG tablet Take 1 tablet (500 mg total) by mouth 2 (two) times daily.   . cloNIDine (CATAPRES - DOSED IN MG/24 HR) 0.2 mg/24hr patch  06/20/2016: Received from: External Pharmacy  . cloNIDine (CATAPRES - DOSED IN MG/24 HR) 0.2 mg/24hr patch apply 1 patch every week   . clopidogrel (PLAVIX) 75 MG tablet take 1 tablet by mouth once daily   . conjugated estrogens (PREMARIN) vaginal cream Place 1 Applicatorful vaginally once a week. At night.   . FEROSUL 325 (65 Fe) MG tablet take 1 tablet by mouth twice a day   . fluticasone (FLONASE) 50 MCG/ACT nasal spray Place 1 spray  into both nostrils daily.   . naproxen (NAPROSYN) 250 MG tablet Take 250 mg by mouth 2 (two) times daily. Take 1-2 tablets BID.   Marland Kitchen nitrofurantoin, macrocrystal-monohydrate, (MACROBID) 100 MG capsule Take 1 capsule (100 mg total) by mouth 2 (two) times daily.   Marland Kitchen omeprazole (PRILOSEC) 20 MG capsule Take 20 mg by mouth daily.  06/20/2016: Received from: External Pharmacy  . omeprazole (PRILOSEC) 20 MG capsule take 1 capsule by mouth once daily FOR BELCHING AND STOMACH.   . triamcinolone (KENALOG) 0.025 % cream Apply 1 application topically 2 (two) times daily.   . VOLTAREN 1 % GEL apply 2 grams to affected area twice a day   . [DISCONTINUED] pravastatin (PRAVACHOL) 20 MG tablet Take 20 mg by mouth daily.  06/20/2016: Received from: External Pharmacy   No facility-administered encounter medications on file as of 12/18/2017.     Surgical History: Past Surgical History:  Procedure Laterality Date  . APPENDECTOMY    . BACK SURGERY    . CAROTID ENDARTERECTOMY Right   . CHOLECYSTECTOMY    . EYE SURGERY Bilateral   . FOOT SURGERY Left   . HERNIA REPAIR    . HIP SURGERY Left   . TONSILLECTOMY      Medical History: Past  Medical History:  Diagnosis Date  . Allergy   . Arthritis   . CKD (chronic kidney disease), stage III ([place])   . GERD (gastroesophageal reflux disease)   . Hyperlipidemia   . Hypertension   . Insomnia   . Stroke [facility])     Family History: Family History  Problem Relation Age of Onset  . Congestive Heart Failure Mother   . Heart disease Mother   . Heart disease Father   . Diabetes Brother     Social History   Socioeconomic History  . Marital status: Widowed    Spouse name: Not on file  . Number of children: Not on file  . Years of education: Not on file  . Highest education level: Not on file  Occupational History  . Not on file  Social Needs  . Financial resource strain: Not on file  . Food insecurity:    Worry: Not on file    Inability: Not on file  .  Transportation needs:    Medical: Not on file    Non-medical: Not on file  Tobacco Use  . Smoking status: Never Smoker  . Smokeless tobacco: Never Used  Substance and Sexual Activity  . Alcohol use: No  . Drug use: No  . Sexual activity: Not on file  Lifestyle  . Physical activity:    Days per week: Not on file    Minutes per session: Not on file  . Stress: Not on file  Relationships  . Social connections:    Talks on phone: Not on file    Gets together: Not on file    Attends religious service: Not on file    Active member of club or organization: Not on file    Attends meetings of clubs or organizations: Not on file    Relationship status: Not on file  . Intimate partner violence:    Fear of current or ex partner: Not on file    Emotionally abused: Not on file    Physically abused: Not on file    Forced sexual activity: Not on file  Other Topics Concern  . Not on file  Social History Narrative  . Not on file      Review of Systems  Constitutional: Negative for activity change, chills, fatigue and unexpected weight change.  HENT: Positive for congestion and ear pain. Negative for postnasal drip, rhinorrhea, sinus pressure, sinus pain and sore throat.   Eyes: Negative.   Respiratory: Negative for chest tightness, shortness of breath and wheezing.   Cardiovascular: Negative for chest pain, palpitations and leg swelling.       BP elevated today.  Gastrointestinal: Negative for constipation, diarrhea, nausea and vomiting.  Endocrine: Negative for cold intolerance, heat intolerance, polydipsia, polyphagia and polyuria.  Genitourinary: Negative for dysuria, flank pain and frequency.  Musculoskeletal: Positive for arthralgias and back pain.  Skin: Negative for rash.  Allergic/Immunologic: Negative for environmental allergies.  Neurological: Positive for weakness. Negative for dizziness and headaches.  Hematological: Negative for adenopathy.  Psychiatric/Behavioral:  Negative for dysphoric mood. The patient is nervous/anxious.     Today's Vitals   12/18/17 0943  BP: (!) 160/65  Pulse: 89  Resp: 16  SpO2: 98%  Weight: 163 lb (73.9 kg)  Height: 5\' 2"  (1.575 m)    Physical Exam  Constitutional: She is oriented to person, place, and time. She appears well-developed and well-nourished. No distress.  HENT:  Head: Normocephalic and atraumatic.  Right Ear: External ear and ear canal  normal.  Left Ear: External ear and ear canal normal.  Nose: Nose normal.  Mouth/Throat: Oropharynx is clear and moist. No oropharyngeal exudate.  Eyes: Pupils are equal, round, and reactive to light. EOM are normal.  Neck: Normal range of motion. Neck supple. No JVD present. No tracheal deviation present. No thyromegaly present.  Cardiovascular: Normal rate, regular rhythm and normal heart sounds. Exam reveals no gallop and no friction rub.  No murmur heard. Pulmonary/Chest: Effort normal and breath sounds normal. No respiratory distress. She has no wheezes. She has no rales. She exhibits no tenderness.  Abdominal: Soft. Bowel sounds are normal. There is no tenderness.  Musculoskeletal: Normal range of motion.  Lymphadenopathy:    She has no cervical adenopathy.  Neurological: She is alert and oriented to person, place, and time. No cranial nerve deficit.  Skin: Skin is warm and dry. She is not diaphoretic.  Psychiatric: She has a normal mood and affect. Her behavior is normal. Judgment and thought content normal.  Nursing note and vitals reviewed.  Assessment/Plan: 1. Essential hypertension, benign Generally stable. Continue bp medication as prescribed.   2. Urinary tract infection with hematuria, site unspecified Resolved.   3. Primary generalized (osteo)arthritis Continue to use NSAIDs as needed and as prescribed.   4. Anemia due to vitamin B12 deficiency, unspecified B12 deficiency type Continue monitly b12 injections.   General Counseling: Lorrane verbalizes  understanding of the findings of todays visit and agrees with plan of treatment. I have discussed any further diagnostic evaluation that may be needed or ordered today. We also reviewed her medications today. she has been encouraged to call the office with any questions or concerns that should arise related to todays visit.  Hypertension Counseling:   The following hypertensive lifestyle modification were recommended and discussed:  1. Limiting alcohol intake to less than 1 oz/day of ethanol:(24 oz of beer or 8 oz of wine or 2 oz of 100-proof whiskey). 2. Take baby ASA 81 mg daily. 3. Importance of regular aerobic exercise and losing weight. 4. Reduce dietary saturated fat and cholesterol intake for overall cardiovascular health. 5. Maintaining adequate dietary potassium, calcium, and magnesium intake. 6. Regular monitoring of the blood pressure. 7. Reduce sodium intake to less than 100 mmol/day (less than 2.3 gm of sodium or less than 6 gm of sodium choride)   This patient was seen by Leretha Pol, FNP- C in Collaboration with Dr Lavera Guise as a part of collaborative care agreement    Time spent: 21 Minutes    Dr Lavera Guise Internal medicine

## 2017-12-21 DIAGNOSIS — I129 Hypertensive chronic kidney disease with stage 1 through stage 4 chronic kidney disease, or unspecified chronic kidney disease: Secondary | ICD-10-CM | POA: Diagnosis not present

## 2017-12-21 DIAGNOSIS — M19012 Primary osteoarthritis, left shoulder: Secondary | ICD-10-CM | POA: Diagnosis not present

## 2017-12-21 DIAGNOSIS — Z87440 Personal history of urinary (tract) infections: Secondary | ICD-10-CM | POA: Diagnosis not present

## 2017-12-21 DIAGNOSIS — N183 Chronic kidney disease, stage 3 (moderate): Secondary | ICD-10-CM | POA: Diagnosis not present

## 2017-12-21 DIAGNOSIS — E119 Type 2 diabetes mellitus without complications: Secondary | ICD-10-CM | POA: Diagnosis not present

## 2017-12-21 DIAGNOSIS — D519 Vitamin B12 deficiency anemia, unspecified: Secondary | ICD-10-CM | POA: Diagnosis not present

## 2017-12-21 DIAGNOSIS — I739 Peripheral vascular disease, unspecified: Secondary | ICD-10-CM | POA: Diagnosis not present

## 2017-12-28 DIAGNOSIS — D519 Vitamin B12 deficiency anemia, unspecified: Secondary | ICD-10-CM | POA: Diagnosis not present

## 2017-12-28 DIAGNOSIS — I739 Peripheral vascular disease, unspecified: Secondary | ICD-10-CM | POA: Diagnosis not present

## 2017-12-28 DIAGNOSIS — Z87440 Personal history of urinary (tract) infections: Secondary | ICD-10-CM | POA: Diagnosis not present

## 2017-12-28 DIAGNOSIS — N183 Chronic kidney disease, stage 3 (moderate): Secondary | ICD-10-CM | POA: Diagnosis not present

## 2017-12-28 DIAGNOSIS — I129 Hypertensive chronic kidney disease with stage 1 through stage 4 chronic kidney disease, or unspecified chronic kidney disease: Secondary | ICD-10-CM | POA: Diagnosis not present

## 2017-12-28 DIAGNOSIS — M19012 Primary osteoarthritis, left shoulder: Secondary | ICD-10-CM | POA: Diagnosis not present

## 2017-12-28 DIAGNOSIS — E119 Type 2 diabetes mellitus without complications: Secondary | ICD-10-CM | POA: Diagnosis not present

## 2018-01-01 DIAGNOSIS — D519 Vitamin B12 deficiency anemia, unspecified: Secondary | ICD-10-CM | POA: Diagnosis not present

## 2018-01-01 DIAGNOSIS — I129 Hypertensive chronic kidney disease with stage 1 through stage 4 chronic kidney disease, or unspecified chronic kidney disease: Secondary | ICD-10-CM | POA: Diagnosis not present

## 2018-01-01 DIAGNOSIS — I739 Peripheral vascular disease, unspecified: Secondary | ICD-10-CM | POA: Diagnosis not present

## 2018-01-01 DIAGNOSIS — E119 Type 2 diabetes mellitus without complications: Secondary | ICD-10-CM | POA: Diagnosis not present

## 2018-01-01 DIAGNOSIS — N183 Chronic kidney disease, stage 3 (moderate): Secondary | ICD-10-CM | POA: Diagnosis not present

## 2018-01-01 DIAGNOSIS — M19012 Primary osteoarthritis, left shoulder: Secondary | ICD-10-CM | POA: Diagnosis not present

## 2018-01-01 DIAGNOSIS — Z87440 Personal history of urinary (tract) infections: Secondary | ICD-10-CM | POA: Diagnosis not present

## 2018-01-04 ENCOUNTER — Other Ambulatory Visit: Payer: Self-pay

## 2018-01-04 DIAGNOSIS — N183 Chronic kidney disease, stage 3 (moderate): Secondary | ICD-10-CM | POA: Diagnosis not present

## 2018-01-04 DIAGNOSIS — M19012 Primary osteoarthritis, left shoulder: Secondary | ICD-10-CM | POA: Diagnosis not present

## 2018-01-04 DIAGNOSIS — I129 Hypertensive chronic kidney disease with stage 1 through stage 4 chronic kidney disease, or unspecified chronic kidney disease: Secondary | ICD-10-CM | POA: Diagnosis not present

## 2018-01-04 DIAGNOSIS — D519 Vitamin B12 deficiency anemia, unspecified: Secondary | ICD-10-CM | POA: Diagnosis not present

## 2018-01-04 DIAGNOSIS — E119 Type 2 diabetes mellitus without complications: Secondary | ICD-10-CM | POA: Diagnosis not present

## 2018-01-04 DIAGNOSIS — I739 Peripheral vascular disease, unspecified: Secondary | ICD-10-CM | POA: Diagnosis not present

## 2018-01-04 DIAGNOSIS — Z87440 Personal history of urinary (tract) infections: Secondary | ICD-10-CM | POA: Diagnosis not present

## 2018-01-08 ENCOUNTER — Ambulatory Visit (INDEPENDENT_AMBULATORY_CARE_PROVIDER_SITE_OTHER): Payer: Medicare HMO | Admitting: Vascular Surgery

## 2018-01-08 ENCOUNTER — Other Ambulatory Visit: Payer: Self-pay | Admitting: Internal Medicine

## 2018-01-08 ENCOUNTER — Encounter (INDEPENDENT_AMBULATORY_CARE_PROVIDER_SITE_OTHER): Payer: Medicare HMO

## 2018-01-11 DIAGNOSIS — N183 Chronic kidney disease, stage 3 (moderate): Secondary | ICD-10-CM | POA: Diagnosis not present

## 2018-01-11 DIAGNOSIS — I129 Hypertensive chronic kidney disease with stage 1 through stage 4 chronic kidney disease, or unspecified chronic kidney disease: Secondary | ICD-10-CM | POA: Diagnosis not present

## 2018-01-11 DIAGNOSIS — Z87440 Personal history of urinary (tract) infections: Secondary | ICD-10-CM | POA: Diagnosis not present

## 2018-01-11 DIAGNOSIS — M19012 Primary osteoarthritis, left shoulder: Secondary | ICD-10-CM | POA: Diagnosis not present

## 2018-01-11 DIAGNOSIS — I739 Peripheral vascular disease, unspecified: Secondary | ICD-10-CM | POA: Diagnosis not present

## 2018-01-11 DIAGNOSIS — E119 Type 2 diabetes mellitus without complications: Secondary | ICD-10-CM | POA: Diagnosis not present

## 2018-01-11 DIAGNOSIS — D519 Vitamin B12 deficiency anemia, unspecified: Secondary | ICD-10-CM | POA: Diagnosis not present

## 2018-01-14 DIAGNOSIS — M19012 Primary osteoarthritis, left shoulder: Secondary | ICD-10-CM | POA: Diagnosis not present

## 2018-01-14 DIAGNOSIS — I739 Peripheral vascular disease, unspecified: Secondary | ICD-10-CM | POA: Diagnosis not present

## 2018-01-14 DIAGNOSIS — Z87440 Personal history of urinary (tract) infections: Secondary | ICD-10-CM | POA: Diagnosis not present

## 2018-01-14 DIAGNOSIS — I129 Hypertensive chronic kidney disease with stage 1 through stage 4 chronic kidney disease, or unspecified chronic kidney disease: Secondary | ICD-10-CM | POA: Diagnosis not present

## 2018-01-14 DIAGNOSIS — D519 Vitamin B12 deficiency anemia, unspecified: Secondary | ICD-10-CM | POA: Diagnosis not present

## 2018-01-14 DIAGNOSIS — N183 Chronic kidney disease, stage 3 (moderate): Secondary | ICD-10-CM | POA: Diagnosis not present

## 2018-01-14 DIAGNOSIS — E119 Type 2 diabetes mellitus without complications: Secondary | ICD-10-CM | POA: Diagnosis not present

## 2018-01-15 DIAGNOSIS — E119 Type 2 diabetes mellitus without complications: Secondary | ICD-10-CM | POA: Diagnosis not present

## 2018-01-15 DIAGNOSIS — I129 Hypertensive chronic kidney disease with stage 1 through stage 4 chronic kidney disease, or unspecified chronic kidney disease: Secondary | ICD-10-CM | POA: Diagnosis not present

## 2018-01-15 DIAGNOSIS — M19012 Primary osteoarthritis, left shoulder: Secondary | ICD-10-CM | POA: Diagnosis not present

## 2018-01-15 DIAGNOSIS — D519 Vitamin B12 deficiency anemia, unspecified: Secondary | ICD-10-CM | POA: Diagnosis not present

## 2018-01-15 DIAGNOSIS — I739 Peripheral vascular disease, unspecified: Secondary | ICD-10-CM | POA: Diagnosis not present

## 2018-01-15 DIAGNOSIS — N183 Chronic kidney disease, stage 3 (moderate): Secondary | ICD-10-CM | POA: Diagnosis not present

## 2018-01-15 DIAGNOSIS — Z87440 Personal history of urinary (tract) infections: Secondary | ICD-10-CM | POA: Diagnosis not present

## 2018-01-25 DIAGNOSIS — E119 Type 2 diabetes mellitus without complications: Secondary | ICD-10-CM | POA: Diagnosis not present

## 2018-01-25 DIAGNOSIS — D519 Vitamin B12 deficiency anemia, unspecified: Secondary | ICD-10-CM | POA: Diagnosis not present

## 2018-01-25 DIAGNOSIS — M19012 Primary osteoarthritis, left shoulder: Secondary | ICD-10-CM | POA: Diagnosis not present

## 2018-01-25 DIAGNOSIS — I739 Peripheral vascular disease, unspecified: Secondary | ICD-10-CM | POA: Diagnosis not present

## 2018-01-25 DIAGNOSIS — Z87440 Personal history of urinary (tract) infections: Secondary | ICD-10-CM | POA: Diagnosis not present

## 2018-01-25 DIAGNOSIS — I129 Hypertensive chronic kidney disease with stage 1 through stage 4 chronic kidney disease, or unspecified chronic kidney disease: Secondary | ICD-10-CM | POA: Diagnosis not present

## 2018-01-25 DIAGNOSIS — N183 Chronic kidney disease, stage 3 (moderate): Secondary | ICD-10-CM | POA: Diagnosis not present

## 2018-02-01 DIAGNOSIS — E119 Type 2 diabetes mellitus without complications: Secondary | ICD-10-CM | POA: Diagnosis not present

## 2018-02-01 DIAGNOSIS — D519 Vitamin B12 deficiency anemia, unspecified: Secondary | ICD-10-CM | POA: Diagnosis not present

## 2018-02-01 DIAGNOSIS — N183 Chronic kidney disease, stage 3 (moderate): Secondary | ICD-10-CM | POA: Diagnosis not present

## 2018-02-01 DIAGNOSIS — Z87440 Personal history of urinary (tract) infections: Secondary | ICD-10-CM | POA: Diagnosis not present

## 2018-02-01 DIAGNOSIS — M19012 Primary osteoarthritis, left shoulder: Secondary | ICD-10-CM | POA: Diagnosis not present

## 2018-02-01 DIAGNOSIS — I739 Peripheral vascular disease, unspecified: Secondary | ICD-10-CM | POA: Diagnosis not present

## 2018-02-01 DIAGNOSIS — I129 Hypertensive chronic kidney disease with stage 1 through stage 4 chronic kidney disease, or unspecified chronic kidney disease: Secondary | ICD-10-CM | POA: Diagnosis not present

## 2018-02-08 DIAGNOSIS — N183 Chronic kidney disease, stage 3 (moderate): Secondary | ICD-10-CM | POA: Diagnosis not present

## 2018-02-08 DIAGNOSIS — M19012 Primary osteoarthritis, left shoulder: Secondary | ICD-10-CM | POA: Diagnosis not present

## 2018-02-08 DIAGNOSIS — I739 Peripheral vascular disease, unspecified: Secondary | ICD-10-CM | POA: Diagnosis not present

## 2018-02-08 DIAGNOSIS — D519 Vitamin B12 deficiency anemia, unspecified: Secondary | ICD-10-CM | POA: Diagnosis not present

## 2018-02-08 DIAGNOSIS — Z87440 Personal history of urinary (tract) infections: Secondary | ICD-10-CM | POA: Diagnosis not present

## 2018-02-08 DIAGNOSIS — I129 Hypertensive chronic kidney disease with stage 1 through stage 4 chronic kidney disease, or unspecified chronic kidney disease: Secondary | ICD-10-CM | POA: Diagnosis not present

## 2018-02-08 DIAGNOSIS — E119 Type 2 diabetes mellitus without complications: Secondary | ICD-10-CM | POA: Diagnosis not present

## 2018-02-15 DIAGNOSIS — D519 Vitamin B12 deficiency anemia, unspecified: Secondary | ICD-10-CM | POA: Diagnosis not present

## 2018-02-15 DIAGNOSIS — Z87440 Personal history of urinary (tract) infections: Secondary | ICD-10-CM | POA: Diagnosis not present

## 2018-02-15 DIAGNOSIS — Z7902 Long term (current) use of antithrombotics/antiplatelets: Secondary | ICD-10-CM | POA: Diagnosis not present

## 2018-02-15 DIAGNOSIS — I739 Peripheral vascular disease, unspecified: Secondary | ICD-10-CM | POA: Diagnosis not present

## 2018-02-15 DIAGNOSIS — M19012 Primary osteoarthritis, left shoulder: Secondary | ICD-10-CM | POA: Diagnosis not present

## 2018-02-15 DIAGNOSIS — Z7951 Long term (current) use of inhaled steroids: Secondary | ICD-10-CM | POA: Diagnosis not present

## 2018-02-15 DIAGNOSIS — N183 Chronic kidney disease, stage 3 (moderate): Secondary | ICD-10-CM | POA: Diagnosis not present

## 2018-02-15 DIAGNOSIS — I129 Hypertensive chronic kidney disease with stage 1 through stage 4 chronic kidney disease, or unspecified chronic kidney disease: Secondary | ICD-10-CM | POA: Diagnosis not present

## 2018-02-15 DIAGNOSIS — E1122 Type 2 diabetes mellitus with diabetic chronic kidney disease: Secondary | ICD-10-CM | POA: Diagnosis not present

## 2018-02-18 DIAGNOSIS — I129 Hypertensive chronic kidney disease with stage 1 through stage 4 chronic kidney disease, or unspecified chronic kidney disease: Secondary | ICD-10-CM | POA: Diagnosis not present

## 2018-02-18 DIAGNOSIS — I739 Peripheral vascular disease, unspecified: Secondary | ICD-10-CM | POA: Diagnosis not present

## 2018-02-18 DIAGNOSIS — D519 Vitamin B12 deficiency anemia, unspecified: Secondary | ICD-10-CM | POA: Diagnosis not present

## 2018-02-18 DIAGNOSIS — Z7902 Long term (current) use of antithrombotics/antiplatelets: Secondary | ICD-10-CM | POA: Diagnosis not present

## 2018-02-18 DIAGNOSIS — N183 Chronic kidney disease, stage 3 (moderate): Secondary | ICD-10-CM | POA: Diagnosis not present

## 2018-02-18 DIAGNOSIS — Z87440 Personal history of urinary (tract) infections: Secondary | ICD-10-CM | POA: Diagnosis not present

## 2018-02-18 DIAGNOSIS — E1122 Type 2 diabetes mellitus with diabetic chronic kidney disease: Secondary | ICD-10-CM | POA: Diagnosis not present

## 2018-02-18 DIAGNOSIS — Z7951 Long term (current) use of inhaled steroids: Secondary | ICD-10-CM | POA: Diagnosis not present

## 2018-02-18 DIAGNOSIS — M19012 Primary osteoarthritis, left shoulder: Secondary | ICD-10-CM | POA: Diagnosis not present

## 2018-02-22 DIAGNOSIS — N183 Chronic kidney disease, stage 3 (moderate): Secondary | ICD-10-CM | POA: Diagnosis not present

## 2018-02-22 DIAGNOSIS — D519 Vitamin B12 deficiency anemia, unspecified: Secondary | ICD-10-CM | POA: Diagnosis not present

## 2018-02-22 DIAGNOSIS — E1122 Type 2 diabetes mellitus with diabetic chronic kidney disease: Secondary | ICD-10-CM | POA: Diagnosis not present

## 2018-02-22 DIAGNOSIS — Z7902 Long term (current) use of antithrombotics/antiplatelets: Secondary | ICD-10-CM | POA: Diagnosis not present

## 2018-02-22 DIAGNOSIS — Z7951 Long term (current) use of inhaled steroids: Secondary | ICD-10-CM | POA: Diagnosis not present

## 2018-02-22 DIAGNOSIS — I129 Hypertensive chronic kidney disease with stage 1 through stage 4 chronic kidney disease, or unspecified chronic kidney disease: Secondary | ICD-10-CM | POA: Diagnosis not present

## 2018-02-22 DIAGNOSIS — M19012 Primary osteoarthritis, left shoulder: Secondary | ICD-10-CM | POA: Diagnosis not present

## 2018-02-22 DIAGNOSIS — I739 Peripheral vascular disease, unspecified: Secondary | ICD-10-CM | POA: Diagnosis not present

## 2018-02-22 DIAGNOSIS — Z87440 Personal history of urinary (tract) infections: Secondary | ICD-10-CM | POA: Diagnosis not present

## 2018-03-01 DIAGNOSIS — Z7902 Long term (current) use of antithrombotics/antiplatelets: Secondary | ICD-10-CM | POA: Diagnosis not present

## 2018-03-01 DIAGNOSIS — N183 Chronic kidney disease, stage 3 (moderate): Secondary | ICD-10-CM | POA: Diagnosis not present

## 2018-03-01 DIAGNOSIS — E1122 Type 2 diabetes mellitus with diabetic chronic kidney disease: Secondary | ICD-10-CM | POA: Diagnosis not present

## 2018-03-01 DIAGNOSIS — Z87440 Personal history of urinary (tract) infections: Secondary | ICD-10-CM | POA: Diagnosis not present

## 2018-03-01 DIAGNOSIS — I129 Hypertensive chronic kidney disease with stage 1 through stage 4 chronic kidney disease, or unspecified chronic kidney disease: Secondary | ICD-10-CM | POA: Diagnosis not present

## 2018-03-01 DIAGNOSIS — Z7951 Long term (current) use of inhaled steroids: Secondary | ICD-10-CM | POA: Diagnosis not present

## 2018-03-01 DIAGNOSIS — M19012 Primary osteoarthritis, left shoulder: Secondary | ICD-10-CM | POA: Diagnosis not present

## 2018-03-01 DIAGNOSIS — I739 Peripheral vascular disease, unspecified: Secondary | ICD-10-CM | POA: Diagnosis not present

## 2018-03-01 DIAGNOSIS — D519 Vitamin B12 deficiency anemia, unspecified: Secondary | ICD-10-CM | POA: Diagnosis not present

## 2018-03-05 DIAGNOSIS — Z87440 Personal history of urinary (tract) infections: Secondary | ICD-10-CM | POA: Diagnosis not present

## 2018-03-05 DIAGNOSIS — N183 Chronic kidney disease, stage 3 (moderate): Secondary | ICD-10-CM | POA: Diagnosis not present

## 2018-03-05 DIAGNOSIS — I739 Peripheral vascular disease, unspecified: Secondary | ICD-10-CM | POA: Diagnosis not present

## 2018-03-05 DIAGNOSIS — M19012 Primary osteoarthritis, left shoulder: Secondary | ICD-10-CM | POA: Diagnosis not present

## 2018-03-05 DIAGNOSIS — E1122 Type 2 diabetes mellitus with diabetic chronic kidney disease: Secondary | ICD-10-CM | POA: Diagnosis not present

## 2018-03-05 DIAGNOSIS — Z7951 Long term (current) use of inhaled steroids: Secondary | ICD-10-CM | POA: Diagnosis not present

## 2018-03-05 DIAGNOSIS — I129 Hypertensive chronic kidney disease with stage 1 through stage 4 chronic kidney disease, or unspecified chronic kidney disease: Secondary | ICD-10-CM | POA: Diagnosis not present

## 2018-03-05 DIAGNOSIS — D519 Vitamin B12 deficiency anemia, unspecified: Secondary | ICD-10-CM | POA: Diagnosis not present

## 2018-03-05 DIAGNOSIS — Z7902 Long term (current) use of antithrombotics/antiplatelets: Secondary | ICD-10-CM | POA: Diagnosis not present

## 2018-03-06 ENCOUNTER — Other Ambulatory Visit: Payer: Self-pay

## 2018-03-06 MED ORDER — CLONIDINE 0.2 MG/24HR TD PTWK: MEDICATED_PATCH | TRANSDERMAL | 5 refills | Status: DC

## 2018-03-08 DIAGNOSIS — M19012 Primary osteoarthritis, left shoulder: Secondary | ICD-10-CM | POA: Diagnosis not present

## 2018-03-08 DIAGNOSIS — Z7902 Long term (current) use of antithrombotics/antiplatelets: Secondary | ICD-10-CM | POA: Diagnosis not present

## 2018-03-08 DIAGNOSIS — I739 Peripheral vascular disease, unspecified: Secondary | ICD-10-CM | POA: Diagnosis not present

## 2018-03-08 DIAGNOSIS — D519 Vitamin B12 deficiency anemia, unspecified: Secondary | ICD-10-CM | POA: Diagnosis not present

## 2018-03-08 DIAGNOSIS — Z87440 Personal history of urinary (tract) infections: Secondary | ICD-10-CM | POA: Diagnosis not present

## 2018-03-08 DIAGNOSIS — N183 Chronic kidney disease, stage 3 (moderate): Secondary | ICD-10-CM | POA: Diagnosis not present

## 2018-03-08 DIAGNOSIS — E1122 Type 2 diabetes mellitus with diabetic chronic kidney disease: Secondary | ICD-10-CM | POA: Diagnosis not present

## 2018-03-08 DIAGNOSIS — I129 Hypertensive chronic kidney disease with stage 1 through stage 4 chronic kidney disease, or unspecified chronic kidney disease: Secondary | ICD-10-CM | POA: Diagnosis not present

## 2018-03-08 DIAGNOSIS — Z7951 Long term (current) use of inhaled steroids: Secondary | ICD-10-CM | POA: Diagnosis not present

## 2018-03-15 DIAGNOSIS — I739 Peripheral vascular disease, unspecified: Secondary | ICD-10-CM | POA: Diagnosis not present

## 2018-03-15 DIAGNOSIS — I129 Hypertensive chronic kidney disease with stage 1 through stage 4 chronic kidney disease, or unspecified chronic kidney disease: Secondary | ICD-10-CM | POA: Diagnosis not present

## 2018-03-15 DIAGNOSIS — Z7902 Long term (current) use of antithrombotics/antiplatelets: Secondary | ICD-10-CM | POA: Diagnosis not present

## 2018-03-15 DIAGNOSIS — Z87440 Personal history of urinary (tract) infections: Secondary | ICD-10-CM | POA: Diagnosis not present

## 2018-03-15 DIAGNOSIS — E1122 Type 2 diabetes mellitus with diabetic chronic kidney disease: Secondary | ICD-10-CM | POA: Diagnosis not present

## 2018-03-15 DIAGNOSIS — M19012 Primary osteoarthritis, left shoulder: Secondary | ICD-10-CM | POA: Diagnosis not present

## 2018-03-15 DIAGNOSIS — Z7951 Long term (current) use of inhaled steroids: Secondary | ICD-10-CM | POA: Diagnosis not present

## 2018-03-15 DIAGNOSIS — N183 Chronic kidney disease, stage 3 (moderate): Secondary | ICD-10-CM | POA: Diagnosis not present

## 2018-03-15 DIAGNOSIS — D519 Vitamin B12 deficiency anemia, unspecified: Secondary | ICD-10-CM | POA: Diagnosis not present

## 2018-03-17 ENCOUNTER — Observation Stay
Admission: EM | Admit: 2018-03-17 | Discharge: 2018-03-19 | Disposition: A | Discharge status: Home / Self Care | Payer: Medicare HMO | Attending: Internal Medicine | Admitting: Internal Medicine

## 2018-03-17 ENCOUNTER — Other Ambulatory Visit: Payer: Self-pay

## 2018-03-17 ENCOUNTER — Emergency Department: Payer: Medicare HMO

## 2018-03-17 ENCOUNTER — Observation Stay: Payer: Medicare HMO

## 2018-03-17 DIAGNOSIS — Z882 Allergy status to sulfonamides status: Secondary | ICD-10-CM | POA: Insufficient documentation

## 2018-03-17 DIAGNOSIS — Z7902 Long term (current) use of antithrombotics/antiplatelets: Secondary | ICD-10-CM | POA: Diagnosis not present

## 2018-03-17 DIAGNOSIS — R55 Syncope and collapse: Secondary | ICD-10-CM | POA: Diagnosis not present

## 2018-03-17 DIAGNOSIS — G4700 Insomnia, unspecified: Secondary | ICD-10-CM | POA: Diagnosis not present

## 2018-03-17 DIAGNOSIS — I129 Hypertensive chronic kidney disease with stage 1 through stage 4 chronic kidney disease, or unspecified chronic kidney disease: Secondary | ICD-10-CM | POA: Diagnosis not present

## 2018-03-17 DIAGNOSIS — R197 Diarrhea, unspecified: Secondary | ICD-10-CM | POA: Diagnosis not present

## 2018-03-17 DIAGNOSIS — K219 Gastro-esophageal reflux disease without esophagitis: Secondary | ICD-10-CM | POA: Diagnosis not present

## 2018-03-17 DIAGNOSIS — D638 Anemia in other chronic diseases classified elsewhere: Secondary | ICD-10-CM | POA: Diagnosis not present

## 2018-03-17 DIAGNOSIS — R109 Unspecified abdominal pain: Secondary | ICD-10-CM | POA: Diagnosis not present

## 2018-03-17 DIAGNOSIS — E041 Nontoxic single thyroid nodule: Secondary | ICD-10-CM | POA: Insufficient documentation

## 2018-03-17 DIAGNOSIS — Z66 Do not resuscitate: Secondary | ICD-10-CM | POA: Diagnosis not present

## 2018-03-17 DIAGNOSIS — Z8673 Personal history of transient ischemic attack (TIA), and cerebral infarction without residual deficits: Secondary | ICD-10-CM | POA: Insufficient documentation

## 2018-03-17 DIAGNOSIS — R4020 Unspecified coma: Secondary | ICD-10-CM

## 2018-03-17 DIAGNOSIS — Z9181 History of falling: Secondary | ICD-10-CM | POA: Diagnosis not present

## 2018-03-17 DIAGNOSIS — N183 Chronic kidney disease, stage 3 (moderate): Secondary | ICD-10-CM | POA: Diagnosis not present

## 2018-03-17 DIAGNOSIS — I10 Essential (primary) hypertension: Secondary | ICD-10-CM | POA: Diagnosis not present

## 2018-03-17 DIAGNOSIS — E785 Hyperlipidemia, unspecified: Secondary | ICD-10-CM | POA: Diagnosis not present

## 2018-03-17 DIAGNOSIS — Z79899 Other long term (current) drug therapy: Secondary | ICD-10-CM | POA: Diagnosis not present

## 2018-03-17 DIAGNOSIS — R2681 Unsteadiness on feet: Secondary | ICD-10-CM | POA: Diagnosis not present

## 2018-03-17 DIAGNOSIS — D649 Anemia, unspecified: Secondary | ICD-10-CM

## 2018-03-17 DIAGNOSIS — R410 Disorientation, unspecified: Secondary | ICD-10-CM | POA: Diagnosis not present

## 2018-03-17 DIAGNOSIS — I639 Cerebral infarction, unspecified: Secondary | ICD-10-CM | POA: Diagnosis not present

## 2018-03-17 DIAGNOSIS — I6522 Occlusion and stenosis of left carotid artery: Principal | ICD-10-CM | POA: Insufficient documentation

## 2018-03-17 LAB — COMPREHENSIVE METABOLIC PANEL
ALBUMIN: 3.5 g/dL (ref 3.5–5.0)
ALT: 12 U/L (ref 0–44)
ANION GAP: 7 (ref 5–15)
AST: 24 U/L (ref 15–41)
Alkaline Phosphatase: 64 U/L (ref 38–126)
BILIRUBIN TOTAL: 0.5 mg/dL (ref 0.3–1.2)
BUN: 37 mg/dL — AB (ref 8–23)
CO2: 25 mmol/L (ref 22–32)
Calcium: 9 mg/dL (ref 8.9–10.3)
Chloride: 108 mmol/L (ref 98–111)
Creatinine, Ser: 1.45 mg/dL — ABNORMAL HIGH (ref 0.44–1.00)
GFR calc Af Amer: 36 mL/min — ABNORMAL LOW (ref >60)
GFR calc non Af Amer: 31 mL/min — ABNORMAL LOW (ref >60)
GLUCOSE: 158 mg/dL — AB (ref 70–99)
POTASSIUM: 4.6 mmol/L (ref 3.5–5.1)
SODIUM: 140 mmol/L (ref 135–145)
TOTAL PROTEIN: 6.1 g/dL — AB (ref 6.5–8.1)

## 2018-03-17 LAB — LIPASE, BLOOD: Lipase: 34 U/L (ref 11–51)

## 2018-03-17 LAB — CBC WITH DIFFERENTIAL/PLATELET
BASOS ABS: 0 10*3/uL (ref 0–0.1)
BASOS PCT: 1 %
EOS PCT: 1 %
Eosinophils Absolute: 0.1 10*3/uL (ref 0–0.7)
HEMATOCRIT: 24.2 % — AB (ref 35.0–47.0)
Hemoglobin: 8.1 g/dL — ABNORMAL LOW (ref 12.0–16.0)
LYMPHS PCT: 23 %
Lymphs Abs: 1.6 10*3/uL (ref 1.0–3.6)
MCH: 33.4 pg (ref 26.0–34.0)
MCHC: 33.6 g/dL (ref 32.0–36.0)
MCV: 99.2 fL (ref 80.0–100.0)
MONO ABS: 0.4 10*3/uL (ref 0.2–0.9)
Monocytes Relative: 6 %
NEUTROS ABS: 4.8 10*3/uL (ref 1.4–6.5)
Neutrophils Relative %: 69 %
PLATELETS: 172 10*3/uL (ref 150–440)
RBC: 2.44 MIL/uL — AB (ref 3.80–5.20)
RDW: 15.1 % — AB (ref 11.5–14.5)
WBC: 7 10*3/uL (ref 3.6–11.0)

## 2018-03-17 LAB — TROPONIN I
Troponin I: 0.04 ng/mL (ref <0.03)
Troponin I: 0.04 ng/mL (ref <0.03)
Troponin I: 0.04 ng/mL (ref <0.03)

## 2018-03-17 MED ORDER — FLUTICASONE PROPIONATE 50 MCG/ACT NA SUSP
1.0000 | Freq: Every day | NASAL | Status: DC | PRN
  Filled 2018-03-17: qty 16

## 2018-03-17 MED ORDER — ALLOPURINOL 100 MG PO TABS
100.0000 mg | ORAL_TABLET | Freq: Every day | ORAL | Status: DC
  Administered 2018-03-17 – 2018-03-18 (×2): 100 mg via ORAL
  Filled 2018-03-17 (×3): qty 1

## 2018-03-17 MED ORDER — PRAVASTATIN SODIUM 20 MG PO TABS
20.0000 mg | ORAL_TABLET | Freq: Every day | ORAL | Status: DC
  Administered 2018-03-18 – 2018-03-19 (×2): 20 mg via ORAL
  Filled 2018-03-17 (×2): qty 1

## 2018-03-17 MED ORDER — BENAZEPRIL HCL 5 MG PO TABS
5.0000 mg | ORAL_TABLET | Freq: Two times a day (BID) | ORAL | Status: DC
  Administered 2018-03-17 – 2018-03-19 (×4): 5 mg via ORAL
  Filled 2018-03-17 (×5): qty 1

## 2018-03-17 MED ORDER — AMLODIPINE BESYLATE 5 MG PO TABS
2.5000 mg | ORAL_TABLET | Freq: Every day | ORAL | Status: DC
  Administered 2018-03-18 – 2018-03-19 (×2): 2.5 mg via ORAL
  Filled 2018-03-17: qty 1

## 2018-03-17 MED ORDER — CLOPIDOGREL BISULFATE 75 MG PO TABS
75.0000 mg | ORAL_TABLET | Freq: Every day | ORAL | Status: DC
  Administered 2018-03-18: 75 mg via ORAL
  Filled 2018-03-17 (×2): qty 1

## 2018-03-17 MED ORDER — PANTOPRAZOLE SODIUM 40 MG PO TBEC
40.0000 mg | DELAYED_RELEASE_TABLET | Freq: Every day | ORAL | Status: DC
  Administered 2018-03-18 – 2018-03-19 (×2): 40 mg via ORAL
  Filled 2018-03-17 (×2): qty 1

## 2018-03-17 MED ORDER — ADULT MULTIVITAMIN W/MINERALS CH
1.0000 | ORAL_TABLET | Freq: Every day | ORAL | Status: DC
  Administered 2018-03-18 – 2018-03-19 (×2): 1 via ORAL
  Filled 2018-03-17 (×2): qty 1

## 2018-03-17 MED ORDER — FERROUS SULFATE 325 (65 FE) MG PO TABS: 325.0000 mg | ORAL_TABLET | Freq: Two times a day (BID) | ORAL | Status: DC

## 2018-03-17 MED ORDER — ALPRAZOLAM 0.25 MG PO TABS
0.2500 mg | ORAL_TABLET | Freq: Every evening | ORAL | Status: DC | PRN
  Administered 2018-03-17 – 2018-03-18 (×2): 0.25 mg via ORAL
  Filled 2018-03-17 (×2): qty 1

## 2018-03-17 MED ORDER — CLONIDINE HCL 0.2 MG/24HR TD PTWK: 0.2000 mg | MEDICATED_PATCH | TRANSDERMAL | Status: DC

## 2018-03-17 MED ORDER — DOCUSATE SODIUM 100 MG PO CAPS: 100.0000 mg | ORAL_CAPSULE | Freq: Two times a day (BID) | ORAL | Status: DC | PRN

## 2018-03-17 MED ORDER — HEPARIN SODIUM (PORCINE) 5000 UNIT/ML IJ SOLN
5000.0000 [IU] | Freq: Three times a day (TID) | INTRAMUSCULAR | Status: DC
Start: 2018-03-17 — End: 2018-03-19
  Administered 2018-03-17 – 2018-03-19 (×5): 5000 [IU] via SUBCUTANEOUS
  Filled 2018-03-17 (×5): qty 1

## 2018-03-17 MED ORDER — VITAMIN D 1000 UNITS PO TABS
2000.0000 [IU] | ORAL_TABLET | Freq: Every day | ORAL | Status: DC
  Administered 2018-03-18 – 2018-03-19 (×2): 2000 [IU] via ORAL
  Filled 2018-03-17 (×2): qty 2

## 2018-03-17 NOTE — ED Notes (Signed)
Maddy, RN given report.

## 2018-03-17 NOTE — ED Notes (Addendum)
Pt family stating to EDP that pt had an syncopal episode on the commode, pt never fell or hit head, that is why the pt is here.  Pt family states the pt had B12 shot 1 weeks ago and is on Iron supplements for Anemia

## 2018-03-17 NOTE — H&P (Addendum)
[name] NAME: [name]    MR#:  [id number]  DATE OF BIRTH:  [birth year]  DATE OF ADMISSION:  03/17/2018  PRIMARY CARE PHYSICIAN: Lavera Guise, MD   REQUESTING/REFERRING PHYSICIAN: Burlene Arnt  CHIEF COMPLAINT:   Chief Complaint  Patient presents with  . Abdominal Pain    HISTORY OF PRESENT ILLNESS: [name]  is a 84 y.o. female with a known history of arthritis, chronic kidney disease stage III, gastroesophageal reflux disease, hyperlipidemia, hypertension, insomnia, stroke-lives with her daughter and walks with a walker at home.  Today went to the bathroom and was having hard stool initially after that it became loose and then partially watery.  She was also making some sounds so daughter went in to check on her, she was sitting on the commode and holding tight but was not responding to her daughter.  Her eyes were open and she did not fall down.  Daughter did not see any jerking movements.  Patient kept staring at her without talking to her. As per daughter when patient had a stroke 3 years ago this was the same presentation.  Concerned with this she called EMS, when they arrived patient was back to her normal but her blood pressure was running very low up to 70 systolic, they suggested to bring to emergency room. CT scan of the head was negative in ER, initial infection work-up was negative.  ER physician noted her drop in hemoglobin from 10-8.1, stool for guaiac was negative.  Concern with her episode of unresponsiveness, and dropping the hemoglobin, he suggested to monitor in the hospital.  PAST MEDICAL HISTORY:   Past Medical History:  Diagnosis Date  . Allergy   . Arthritis   . CKD (chronic kidney disease), stage III ([place])   . GERD (gastroesophageal reflux disease)   . Hyperlipidemia   . Hypertension   . Insomnia   . Stroke [facility])     PAST SURGICAL HISTORY:  Past Surgical History:  Procedure Laterality Date  .  APPENDECTOMY    . BACK SURGERY    . CAROTID ENDARTERECTOMY Right   . CHOLECYSTECTOMY    . EYE SURGERY Bilateral   . FOOT SURGERY Left   . HERNIA REPAIR    . HIP SURGERY Left   . TONSILLECTOMY      SOCIAL HISTORY:  Social History   Tobacco Use  . Smoking status: Never Smoker  . Smokeless tobacco: Never Used  Substance Use Topics  . Alcohol use: No    FAMILY HISTORY:  Family History  Problem Relation Age of Onset  . Congestive Heart Failure Mother   . Heart disease Mother   . Heart disease Father   . Diabetes Brother     DRUG ALLERGIES:  Allergies  Allergen Reactions  . Sulfa Antibiotics     REVIEW OF SYSTEMS:   CONSTITUTIONAL: No fever, fatigue or weakness.  EYES: No blurred or double vision.  EARS, NOSE, AND THROAT: No tinnitus or ear pain.  RESPIRATORY: No cough, shortness of breath, wheezing or hemoptysis.  CARDIOVASCULAR: No chest pain, orthopnea, edema.  GASTROINTESTINAL: No nausea, vomiting, diarrhea or abdominal pain.  GENITOURINARY: No dysuria, hematuria.  ENDOCRINE: No polyuria, nocturia,  HEMATOLOGY: No anemia, easy bruising or bleeding SKIN: No rash or lesion. MUSCULOSKELETAL: No joint pain or arthritis.   NEUROLOGIC: No tingling, numbness, weakness.  PSYCHIATRY: No anxiety or depression.   MEDICATIONS AT HOME:  Prior to Admission medications   Medication  Sig Start Date End Date Taking? Authorizing Provider  allopurinol (ZYLOPRIM) 100 MG tablet take 2 tablets by mouth at bedtime for gout 11/20/17  Yes Ronnell Freshwater, NP  ALPRAZolam Duanne Moron) 0.25 MG tablet take 1 tablet by mouth at bedtime if needed for anxiety Patient taking differently: take 1 tablet by mouth at bedtime 12/13/17  Yes Ronnell Freshwater, NP  amLODipine (NORVASC) 2.5 MG tablet take 1 tablet by mouth at bedtime Patient taking differently: take 1 tablet by mouth at lunch 10/12/17  Yes Boscia, Heather E, NP  benazepril (LOTENSIN) 5 MG tablet take 1 tablet by mouth twice a day 11/12/17  Yes  Boscia, Greer Ee, NP  Cholecalciferol (VITAMIN D3) 2000 units capsule Take 2,000 Units by mouth daily.    Yes [provider]  cloNIDine (CATAPRES - DOSED IN MG/24 HR) 0.2 mg/24hr patch apply 1 patch every week 03/06/18  Yes Ronnell Freshwater, NP  clopidogrel (PLAVIX) 75 MG tablet take 1 tablet by mouth once daily 11/13/17  Yes Boscia, Heather E, NP  conjugated estrogens (PREMARIN) vaginal cream Place 1 Applicatorful vaginally once a week. At night.   Yes [provider]  FEROSUL 325 (65 Fe) MG tablet take 1 tablet by mouth twice a day 11/12/17  Yes Boscia, Heather E, NP  fluticasone (FLONASE) 50 MCG/ACT nasal spray Place 1 spray into both nostrils daily as needed for allergies.    Yes [provider]  Multiple Vitamins-Minerals (CENTRUM SILVER 50+WOMEN) TABS Take 1 tablet by mouth daily.   Yes [provider]  omeprazole (PRILOSEC) 20 MG capsule take 1 capsule by mouth once daily FOR BELCHING AND STOMACH. 10/12/17  Yes Boscia, Heather E, NP  pravastatin (PRAVACHOL) 20 MG tablet take 1 tablet by mouth once daily 01/08/18  Yes Boscia, Heather E, NP  triamcinolone (KENALOG) 0.025 % cream Apply 1 application topically 2 (two) times daily. 11/09/17  Yes Ronnell Freshwater, NP  VOLTAREN 1 % GEL apply 2 grams to affected area twice a day 11/07/17  Yes Boscia, Heather E, NP      PHYSICAL EXAMINATION:   VITAL SIGNS: Blood pressure (!) 145/57, pulse 74, temperature 98.2 F (36.8 C), temperature source Oral, resp. rate 15, height 5\' 2"  (1.575 m), weight 73 kg (161 lb), SpO2 100 %.  GENERAL:  84 y.o.-year-old patient lying in the bed with no acute distress.  EYES: Pupils equal, round, reactive to light and accommodation. No scleral icterus. Extraocular muscles intact.  HEENT: Head atraumatic, normocephalic. Oropharynx and nasopharynx clear.  NECK:  Supple, no jugular venous distention. No thyroid enlargement, no tenderness.  LUNGS: Normal breath sounds bilaterally, no wheezing,  rales,rhonchi or crepitation. No use of accessory muscles of respiration.  CARDIOVASCULAR: S1, S2 normal. No murmurs, rubs, or gallops.  ABDOMEN: Soft, nontender, nondistended. Bowel sounds present. No organomegaly or mass.  EXTREMITIES: No pedal edema, cyanosis, or clubbing.  NEUROLOGIC: Cranial nerves II through XII are intact. Muscle strength 5/5 in all extremities. Sensation intact. Gait not checked.  PSYCHIATRIC: The patient is alert and oriented x 3.  SKIN: No obvious rash, lesion, or ulcer.   LABORATORY PANEL:   CBC Recent Labs  Lab 03/17/18 1359  WBC 7.0  HGB 8.1*  HCT 24.2*  PLT 172  MCV 99.2  MCH 33.4  MCHC 33.6  RDW 15.1*  LYMPHSABS 1.6  MONOABS 0.4  EOSABS 0.1  BASOSABS 0.0   ------------------------------------------------------------------------------------------------------------------  Chemistries  Recent Labs  Lab 03/17/18 1359  NA 140  K 4.6  CL 108  CO2 25  GLUCOSE 158*  BUN 37*  CREATININE 1.45*  CALCIUM 9.0  AST 24  ALT 12  ALKPHOS 64  BILITOT 0.5   ------------------------------------------------------------------------------------------------------------------ estimated creatinine clearance is 24.6 mL/min (A) (by C-G formula based on SCr of 1.45 mg/dL (H)). ------------------------------------------------------------------------------------------------------------------ No results for input(s): TSH, T4TOTAL, T3FREE, THYROIDAB in the last 72 hours.  Invalid input(s): FREET3   Coagulation profile No results for input(s): INR, PROTIME in the last 168 hours. ------------------------------------------------------------------------------------------------------------------- No results for input(s): DDIMER in the last 72 hours. -------------------------------------------------------------------------------------------------------------------  Cardiac Enzymes Recent Labs  Lab 03/17/18 1359  TROPONINI 0.04*    ------------------------------------------------------------------------------------------------------------------ Invalid input(s): POCBNP  ---------------------------------------------------------------------------------------------------------------  Urinalysis    Component Value Date/Time   COLORURINE Yellow 04/05/2014 2033   APPEARANCEUR Cloudy 04/05/2014 2033   LABSPEC 1.011 04/05/2014 2033   PHURINE 5.0 04/05/2014 2033   GLUCOSEU Negative 04/05/2014 2033   HGBUR 1+ 04/05/2014 2033   BILIRUBINUR negative 11/09/2017 1439   BILIRUBINUR Negative 04/05/2014 2033   KETONESUR Negative 04/05/2014 2033   PROTEINUR 30 11/09/2017 1439   PROTEINUR 30 mg/dL 04/05/2014 2033   UROBILINOGEN 0.2 11/09/2017 1439   NITRITE negative 11/09/2017 1439   NITRITE Positive 04/05/2014 2033   LEUKOCYTESUR Moderate (2+) (A) 11/09/2017 1439   LEUKOCYTESUR Trace 04/05/2014 2033     RADIOLOGY: Ct Head Wo Contrast  Result Date: 03/17/2018 CLINICAL DATA:  Confusion, hypoglycemia EXAM: CT HEAD WITHOUT CONTRAST TECHNIQUE: Contiguous axial images were obtained from the base of the skull through the vertex without intravenous contrast. COMPARISON:  04/05/2014 FINDINGS: Brain: No evidence of acute infarction, hemorrhage, hydrocephalus, extra-axial collection or mass lesion/mass effect. Encephalomalacic changes related to old right parietal infarct (series 2/image 15). Subcortical white matter and periventricular small vessel ischemic changes. Vascular: Intracranial atherosclerosis. Skull: Normal. Negative for fracture or focal lesion. Sinuses/Orbits: The visualized paranasal sinuses are essentially clear. The mastoid air cells are unopacified. Other: None. IMPRESSION: No evidence of acute intracranial abnormality. Encephalomalacic changes related to old right parietal infarct. Small vessel ischemic changes. Electronically Signed   By: Julian Hy M.D.   On: 03/17/2018 15:01   Dg Chest Port 1 View  Result  Date: 03/17/2018 CLINICAL DATA:  Abdominal pain EXAM: PORTABLE CHEST 1 VIEW COMPARISON:  04/05/2014 FINDINGS: Lungs are clear.  No pleural effusion or pneumothorax. Cardiomegaly. IMPRESSION: No evidence of acute cardiopulmonary disease. Electronically Signed   By: Julian Hy M.D.   On: 03/17/2018 15:01    EKG: Orders placed or performed during the hospital encounter of 03/17/18  . ED EKG  . ED EKG  . EKG 12-Lead  . EKG 12-Lead  . EKG 12-Lead  . EKG 12-Lead    IMPRESSION AND PLAN:  *Episode of unresponsiveness Patient was not very well responsive, staring look but no fall or jerking movements for almost 10 minutes. CT scan of the head is negative. We will do MRI on the brain and keep on telemetry monitoring with echocardiogram. Neurology consult for further management. If all of them are negative then this could be a vasovagal episode.  *Hypertension Continue with home medication, stable. Blood pressure was noted very low by EMS, but currently rates running 150s so I will continue the medications.  *History of stroke We will do MRI on the brain to rule out a new stroke as her presentation is similar to her previous stroke. Continue Plavix and pravastatin.  *Hyperlipidemia Continue pravastatin.  * Anemia   Pt have chronic anemia, takes Iron tablets.   Guiac negative by  ER>   Will refer to Hematology clinic.  All the records are reviewed and case discussed with ED provider. Management plans discussed with the patient, family and they are in agreement.  CODE STATUS: DNR  Discussed with her daughter and daughter-in-law in the room.  TOTAL TIME TAKING CARE OF THIS PATIENT: 45 minutes.    Vaughan Basta M.D on 03/17/2018   Between 7am to 6pm - Pager - [phone]  After 6pm go to [email or web address] - password [email or web address] [facility]  Office  [phone]  CC: Primary care physician; Lavera Guise, MD   Note: This dictation was prepared with  Dragon dictation along with smaller phrase technology. Any transcriptional errors that result from this process are unintentional.

## 2018-03-17 NOTE — ED Notes (Signed)
Labs sent at this time. Pt and family aware of the need for urine. Hat was placed at this time.

## 2018-03-17 NOTE — ED Triage Notes (Signed)
Pt presents today from home for abd pain. Pt is A/ox4. Pt states that abd pain is cramping and then stopped. Pt is on an Iron supplement. Pt had a BM and now abd pain has subside.

## 2018-03-17 NOTE — ED Provider Notes (Addendum)
[facility] Emergency Department Provider Note  ____________________________________________   I have reviewed the triage vital signs and the nursing notes. Where available I have reviewed prior notes and, if possible and indicated, outside hospital notes.    HISTORY  Chief Complaint Abdominal Pain    HPI [name] is a 84 y.o. female who was at her baseline at this time presents today after having had a bowel movement.  While she was on the bowel movement, she states she felt a little lightheaded.  It was not seen to be bloody or melanotic..  It may have been somewhat loose, for a few minutes while she was in the process of having a bowel movement the family was talking to her and while she did not fall or pass out she seemed to be awake but not talking to them.  She states "I just did not feel like talking I guess".  States she was done with a bowel movement she felt better, EMS did not report any altered mental status, she has no abdominal pain no fever no chills no chest pain or shortness of breath with the complaint.   Family arrived they state that she was "out of it" for almost 15 minutes and they are worried that she may have had a stroke.      Past Medical History:  Diagnosis Date  . Allergy   . Arthritis   . CKD (chronic kidney disease), stage III ([place])   . GERD (gastroesophageal reflux disease)   . Hyperlipidemia   . Hypertension   . Insomnia   . Stroke [facility])     Patient Active Problem List   Diagnosis Date Noted  . Acute pain of left knee 12/05/2017  . Urinary tract infection with hematuria 12/05/2017  . Dysuria 12/05/2017  . Atopic dermatitis 12/05/2017  . Hypothyroidism, unspecified 08/21/2017  . Peripheral vascular disease, unspecified ([place]) 08/21/2017  . Other fatigue 08/21/2017  . Type 2 diabetes mellitus without complications ([place]) 84/13/2440  . Restless leg syndrome 08/21/2017  . Cardiac arrhythmia, unspecified 08/21/2017   . Primary generalized (osteo)arthritis 08/21/2017  . Vitamin B12 deficiency anemia, unspecified 08/21/2017  . Other obesity due to excess calories 08/21/2017  . Chronic kidney disease, unspecified 08/21/2017  . Gastro-esophageal reflux disease without esophagitis 08/21/2017  . Hyperkalemia 08/21/2017  . Shortness of breath 08/21/2017  . Wheezing 08/21/2017  . Nausea 08/21/2017  . Allergic rhinitis due to pollen 08/21/2017  . Acute recurrent sinusitis, unspecified 08/21/2017  . Cerebrovascular disease, unspecified 08/21/2017  . Occlusion and stenosis of bilateral carotid arteries 08/21/2017  . Gout, unspecified 08/21/2017  . Essential hypertension, benign 01/02/2017  . Hyperlipidemia 06/20/2016  . Carotid stenosis 06/20/2016    Past Surgical History:  Procedure Laterality Date  . APPENDECTOMY    . BACK SURGERY    . CAROTID ENDARTERECTOMY Right   . CHOLECYSTECTOMY    . EYE SURGERY Bilateral   . FOOT SURGERY Left   . HERNIA REPAIR    . HIP SURGERY Left   . TONSILLECTOMY      Prior to Admission medications   Medication Sig Start Date End Date Taking? Authorizing Provider  allopurinol (ZYLOPRIM) 100 MG tablet take 2 tablets by mouth at bedtime for gout 11/20/17   Ronnell Freshwater, NP  ALPRAZolam Duanne Moron) 0.25 MG tablet take 1 tablet by mouth at bedtime if needed for anxiety 12/13/17   Ronnell Freshwater, NP  amLODipine (NORVASC) 2.5 MG tablet take 1 tablet by mouth at  bedtime 10/12/17   Ronnell Freshwater, NP  amoxicillin (AMOXIL) 875 MG tablet Take 1 tablet (875 mg total) by mouth 2 (two) times daily. 08/21/17   Ronnell Freshwater, NP  benazepril (LOTENSIN) 5 MG tablet take 1 tablet by mouth twice a day 11/12/17   Ronnell Freshwater, NP  Cholecalciferol (VITAMIN D3) 1000 units CAPS Take by mouth daily.    [provider]  ciprofloxacin (CIPRO) 500 MG tablet Take 1 tablet (500 mg total) by mouth 2 (two) times daily. 11/23/17   Ronnell Freshwater, NP  cloNIDine (CATAPRES - DOSED IN  MG/24 HR) 0.2 mg/24hr patch apply 1 patch every week 03/06/18   Ronnell Freshwater, NP  clopidogrel (PLAVIX) 75 MG tablet take 1 tablet by mouth once daily 11/13/17   Ronnell Freshwater, NP  conjugated estrogens (PREMARIN) vaginal cream Place 1 Applicatorful vaginally once a week. At night.    [provider]  FEROSUL 325 (65 Fe) MG tablet take 1 tablet by mouth twice a day 11/12/17   Ronnell Freshwater, NP  fluticasone (FLONASE) 50 MCG/ACT nasal spray Place 1 spray into both nostrils daily.    [provider]  naproxen (NAPROSYN) 250 MG tablet Take 250 mg by mouth 2 (two) times daily. Take 1-2 tablets BID.    [provider]  nitrofurantoin, macrocrystal-monohydrate, (MACROBID) 100 MG capsule Take 1 capsule (100 mg total) by mouth 2 (two) times daily. 11/09/17   Ronnell Freshwater, NP  omeprazole (PRILOSEC) 20 MG capsule take 1 capsule by mouth once daily FOR BELCHING AND STOMACH. 10/12/17   Ronnell Freshwater, NP  pravastatin (PRAVACHOL) 20 MG tablet take 1 tablet by mouth once daily 01/08/18   Ronnell Freshwater, NP  triamcinolone (KENALOG) 0.025 % cream Apply 1 application topically 2 (two) times daily. 11/09/17   Ronnell Freshwater, NP  VOLTAREN 1 % GEL apply 2 grams to affected area twice a day 11/07/17   Ronnell Freshwater, NP    Allergies Sulfa antibiotics  Family History  Problem Relation Age of Onset  . Congestive Heart Failure Mother   . Heart disease Mother   . Heart disease Father   . Diabetes Brother     Social History Social History   Tobacco Use  . Smoking status: Never Smoker  . Smokeless tobacco: Never Used  Substance Use Topics  . Alcohol use: No  . Drug use: No    Review of Systems Constitutional: No fever/chills Eyes: No visual changes. ENT: No sore throat. No stiff neck no neck pain Cardiovascular: Denies chest pain. Respiratory: Denies shortness of breath. Gastrointestinal:   no vomiting.  See HPI Genitourinary: Negative for  dysuria. Musculoskeletal: Negative lower extremity swelling Skin: Negative for rash. Neurological: Negative for severe headaches, focal weakness or numbness.   ____________________________________________   PHYSICAL EXAM:  VITAL SIGNS: ED Triage Vitals [03/17/18 1351]  Enc Vitals Group     BP (!) 151/90     Pulse Rate 74     Resp      Temp 98.2 F (36.8 C)     Temp Source Oral     SpO2 100 %     Weight 161 lb (73 kg)     Height 5\' 2"  (1.575 m)     Head Circumference      Peak Flow      Pain Score 0     Pain Loc      Pain Edu?      Excl. in  GC?     Constitutional: Alert and oriented. Well appearing and in no acute distress. Eyes: Conjunctivae are normal Head: Atraumatic HEENT: No congestion/rhinnorhea. Mucous membranes are moist.  Oropharynx non-erythematous Neck:   Nontender with no meningismus, no masses, no stridor Cardiovascular: Normal rate, regular rhythm. Grossly normal heart sounds.  Good peripheral circulation. Respiratory: Normal respiratory effort.  No retractions. Lungs CTAB. Abdominal: Soft and nontender. No distention. No guarding no rebound Back:  There is no focal tenderness or step off.  there is no midline tenderness there are no lesions noted. there is no CVA tenderness Musculoskeletal: No lower extremity tenderness, no upper extremity tenderness. No joint effusions, no DVT signs strong distal pulses no edema Neurologic:  Normal speech and language. No gross focal neurologic deficits are appreciated.  Skin:  Skin is warm, dry and intact. No rash noted. Psychiatric: Mood and affect are normal. Speech and behavior are normal.  ____________________________________________   LABS (all labs ordered are listed, but only abnormal results are displayed)  Labs Reviewed  CBC WITH DIFFERENTIAL/PLATELET  COMPREHENSIVE METABOLIC PANEL  LIPASE, BLOOD  URINALYSIS, COMPLETE (UACMP) WITH MICROSCOPIC  TROPONIN I    Pertinent labs  results that were available  during my care of the patient were reviewed by me and considered in my medical decision making (see chart for details). ____________________________________________  EKG  I personally interpreted any EKGs ordered by me or triage Sinus rhythm, rate 75 bpm no acute ST elevation or depression normal axis unremarkable EKG ____________________________________________  RADIOLOGY  Pertinent labs & imaging results that were available during my care of the patient were reviewed by me and considered in my medical decision making (see chart for details). If possible, patient and/or family made aware of any abnormal findings.  No results found. ____________________________________________    PROCEDURES  Procedure(s) performed: None  Procedures  Critical Care performed: None  ____________________________________________   INITIAL IMPRESSION / ASSESSMENT AND PLAN / ED COURSE  Pertinent labs & imaging results that were available during my care of the patient were reviewed by me and considered in my medical decision making (see chart for details).  Patient here after a period of altered mental status while having what appears to be a diarrheal stool on the toilet.  She is guaiac negative here.  However hemoglobin is 8.1 which is lower than we have seen it before.  She usually runs around 11.  Last checked several months ago.  Patient is not actively bleeding at this time, I am concerned that an 84 year old woman who has a large bowel movement comes unresponsive with a hemoglobin of 8 may not be safe for immediate discharge.  We will talk to the hospitalist service.  The rest of her work-up thus far is reassuring.    ____________________________________________   FINAL CLINICAL IMPRESSION(S) / ED DIAGNOSES  Final diagnoses:  Confusion      This chart was dictated using voice recognition software.  Despite best efforts to proofread,  errors can occur which can change meaning.       Schuyler Amor, MD 03/17/18 1449    Schuyler Amor, MD 03/17/18 [phone]

## 2018-03-17 NOTE — Progress Notes (Signed)
Family Meeting Note  Advance Directive:yes  Today a meeting took place with the Patient and daughter.  The following clinical team members were present during this meeting:MD  The following were discussed:Patient's diagnosis: Syncopal episode, Hx of stroke, Hypertension, Patient's progosis: Unable to determine and Goals for treatment: DNR  Additional follow-up to be provided: Neurology  Time spent during discussion:20 minutes  Brooke Basta, MD

## 2018-03-18 ENCOUNTER — Observation Stay: Payer: Medicare HMO

## 2018-03-18 ENCOUNTER — Other Ambulatory Visit: Payer: Medicare HMO

## 2018-03-18 ENCOUNTER — Observation Stay (HOSPITAL_BASED_OUTPATIENT_CLINIC_OR_DEPARTMENT_OTHER)
Admit: 2018-03-18 | Discharge: 2018-03-18 | Disposition: A | Discharge status: Home / Self Care | Payer: Medicare HMO | Attending: Internal Medicine | Admitting: Internal Medicine

## 2018-03-18 DIAGNOSIS — R55 Syncope and collapse: Secondary | ICD-10-CM | POA: Diagnosis not present

## 2018-03-18 DIAGNOSIS — R4020 Unspecified coma: Secondary | ICD-10-CM

## 2018-03-18 DIAGNOSIS — I6522 Occlusion and stenosis of left carotid artery: Secondary | ICD-10-CM | POA: Diagnosis not present

## 2018-03-18 DIAGNOSIS — I10 Essential (primary) hypertension: Secondary | ICD-10-CM | POA: Diagnosis not present

## 2018-03-18 DIAGNOSIS — I340 Nonrheumatic mitral (valve) insufficiency: Secondary | ICD-10-CM | POA: Diagnosis not present

## 2018-03-18 DIAGNOSIS — I6529 Occlusion and stenosis of unspecified carotid artery: Secondary | ICD-10-CM | POA: Diagnosis not present

## 2018-03-18 DIAGNOSIS — I639 Cerebral infarction, unspecified: Secondary | ICD-10-CM | POA: Diagnosis not present

## 2018-03-18 LAB — CBC
HEMATOCRIT: 28.3 % — AB (ref 35.0–47.0)
Hemoglobin: 9.7 g/dL — ABNORMAL LOW (ref 12.0–16.0)
MCH: 34.1 pg — ABNORMAL HIGH (ref 26.0–34.0)
MCHC: 34.4 g/dL (ref 32.0–36.0)
MCV: 99.1 fL (ref 80.0–100.0)
PLATELETS: 208 10*3/uL (ref 150–440)
RBC: 2.86 MIL/uL — ABNORMAL LOW (ref 3.80–5.20)
RDW: 14.9 % — AB (ref 11.5–14.5)
WBC: 8.7 10*3/uL (ref 3.6–11.0)

## 2018-03-18 LAB — URINALYSIS, COMPLETE (UACMP) WITH MICROSCOPIC
BILIRUBIN URINE: NEGATIVE
Glucose, UA: NEGATIVE mg/dL
Hgb urine dipstick: NEGATIVE
KETONES UR: NEGATIVE mg/dL
Nitrite: NEGATIVE
PH: 6 (ref 5.0–8.0)
PROTEIN: 30 mg/dL — AB
SQUAMOUS EPITHELIAL / LPF: NONE SEEN (ref 0–5)
Specific Gravity, Urine: 1.009 (ref 1.005–1.030)

## 2018-03-18 LAB — BASIC METABOLIC PANEL
Anion gap: 8 (ref 5–15)
BUN: 36 mg/dL — AB (ref 8–23)
CO2: 25 mmol/L (ref 22–32)
Calcium: 8.9 mg/dL (ref 8.9–10.3)
Chloride: 110 mmol/L (ref 98–111)
Creatinine, Ser: 1.41 mg/dL — ABNORMAL HIGH (ref 0.44–1.00)
GFR calc Af Amer: 37 mL/min — ABNORMAL LOW (ref >60)
GFR, EST NON AFRICAN AMERICAN: 32 mL/min — AB (ref >60)
GLUCOSE: 92 mg/dL (ref 70–99)
Potassium: 4.2 mmol/L (ref 3.5–5.1)
SODIUM: 143 mmol/L (ref 135–145)

## 2018-03-18 LAB — IRON AND TIBC
IRON: 49 ug/dL (ref 28–170)
Saturation Ratios: 21 % (ref 10.4–31.8)
TIBC: 235 ug/dL — ABNORMAL LOW (ref 250–450)
UIBC: 186 ug/dL

## 2018-03-18 LAB — RETICULOCYTES
RBC.: 2.89 MIL/uL — AB (ref 3.80–5.20)
RETIC COUNT ABSOLUTE: 26 10*3/uL (ref 19.0–183.0)
Retic Ct Pct: 0.9 % (ref 0.4–3.1)

## 2018-03-18 LAB — FERRITIN: Ferritin: 161 ng/mL (ref 11–307)

## 2018-03-18 LAB — ECHOCARDIOGRAM COMPLETE
Height: 62 in
Weight: 2576 oz

## 2018-03-18 LAB — TROPONIN I: TROPONIN I: 0.05 ng/mL — AB (ref <0.03)

## 2018-03-18 LAB — VITAMIN B12: Vitamin B-12: 919 pg/mL — ABNORMAL HIGH (ref 180–914)

## 2018-03-18 LAB — FOLATE: FOLATE: 47 ng/mL (ref >5.9)

## 2018-03-18 MED ORDER — CEFTRIAXONE SODIUM 1 G IJ SOLR
1.0000 g | INTRAMUSCULAR | Status: DC
  Administered 2018-03-18 – 2018-03-19 (×2): 1 g via INTRAVENOUS
  Filled 2018-03-18: qty 1
  Filled 2018-03-18: qty 10
  Filled 2018-03-18: qty 1

## 2018-03-18 MED ORDER — CEPHALEXIN 500 MG PO CAPS: 500.0000 mg | ORAL_CAPSULE | Freq: Three times a day (TID) | ORAL | 0 refills | Status: DC

## 2018-03-18 MED ORDER — IOPAMIDOL (ISOVUE-370) INJECTION 76%
75.0000 mL | Freq: Once | INTRAVENOUS | Status: AC | PRN
  Administered 2018-03-18: 16:00:00 60 mL via INTRAVENOUS

## 2018-03-18 MED ORDER — FERROUS SULFATE 325 (65 FE) MG PO TABS
325.0000 mg | ORAL_TABLET | Freq: Every day | ORAL | Status: DC
  Administered 2018-03-19: 11:00:00 325 mg via ORAL
  Filled 2018-03-18: qty 1

## 2018-03-18 NOTE — Progress Notes (Signed)
*  PRELIMINARY RESULTS* Echocardiogram 2D Echocardiogram has been performed.  Sherrie Sport 03/18/2018, 9:56 AM

## 2018-03-18 NOTE — Evaluation (Signed)
Physical Therapy Evaluation Patient Details Name: [name] MRN: [id number] DOB: 30-Jan-1927 Today's Date: 03/18/2018   History of Present Illness  Patient is  84 yo female that presented to ED due to LOC, admitted for stroke work up and low BP and low hemoglobin. PMH includes arthritis, chronic kidney disease stage III, gastroesophageal reflux disease, hyperlipidemia, hypertension, insomnia, stroke. CT and MRI with no acute changes.   Clinical Impression  Patient A&Ox4 at start of session, has complaints of LBP 3/10. Patient reports prior to admission she lives with her daughter in a 1 story home, ambulates household distances with RW, and attends church with her family. Assistance needed with bathing 1x (an aide), independent with dressing, and family does cooking/cleaning. The patient demonstrates generalized weakness, L > R, as well as decreased activity tolerance, endurance, and mobility. Bed mobility with minAx1 for hand held assist for scooting/trunk elevation, sit <> stand transfers with CGA, and ambulated ~26ft with RW and CGA. Patient exhibited decreased speed, L foot ER and pronated, increased L hip flexion during swing phase of gait, and crouched gait. The patient would benefit from further skilled PT to address these deficits and to maximize independence, safety, and mobility.    Follow Up Recommendations Home health PT;Supervision - Intermittent    Equipment Recommendations  None recommended by PT;Other (comment)(Patient has RW and bedside commode at home, as well as canes)    Recommendations for Other Services       Precautions / Restrictions Precautions Precautions: Fall Restrictions Weight Bearing Restrictions: No      Mobility  Bed Mobility Overal bed mobility: Needs Assistance Bed Mobility: Supine to Sit     Supine to sit: Min assist     General bed mobility comments: Patient encouraged to perform independently, needs hand held assist to scoot to EOB and  minimal trunk elevation  Transfers Overall transfer level: Needs assistance Equipment used: Rolling walker (2 wheeled) Transfers: Sit to/from Stand Sit to Stand: Min guard            Ambulation/Gait Ambulation/Gait assistance: Min guard Gait Distance (Feet): 65 Feet Assistive device: Rolling walker (2 wheeled)       General Gait Details: Decreased speed, L foot ER and pronated, increased L hip flexion during swing phase of gait, crouched gait.   Stairs            Wheelchair Mobility    Modified Rankin (Stroke Patients Only)       Balance Overall balance assessment: Needs assistance Sitting-balance support: Feet supported Sitting balance-Leahy Scale: Good       Standing balance-Leahy Scale: Poor                               Pertinent Vitals/Pain Pain Assessment: 0-10 Pain Score: 3  Pain Location: low back Pain Descriptors / Indicators: Other (Comment);Numbness("hurts") Pain Intervention(s): Monitored during session;Repositioned    Home Living Family/patient expects to be discharged to:: Private residence Living Arrangements: Children Available Help at Discharge: Family;Personal care attendant Type of Home: House Home Access: Stairs to enter Entrance Stairs-Rails: Chemical engineer of Steps: 3 Home Layout: One level Home Equipment: Environmental consultant - 2 wheels;Bedside commode;Cane - quad;Cane - single point      Prior Function Level of Independence: Needs assistance   Gait / Transfers Assistance Needed: Patient ambulates household distances with RW   ADL's / Homemaking Assistance Needed: 1x a week an aide assists with bathing, patient able to  dress independently, family does cooking/cleaning, patient does attend church every week, weather dependent.   Comments: Patient reports 1 fall about 6 months ago due to a trip, no falls recently     Hand Dominance   Dominant Hand: Right    Extremity/Trunk Assessment   Upper  Extremity Assessment Upper Extremity Assessment: RUE deficits/detail;LUE deficits/detail RUE Deficits / Details: 2+ for shoulder flexion, elbow flex/extend and grip 3+/5 LUE Deficits / Details: 2+ for shoulder flexion, elbow flex/extend and grip 3+/5    Lower Extremity Assessment Lower Extremity Assessment: RLE deficits/detail;LLE deficits/detail RLE Deficits / Details: grossly 4-/5 LLE Deficits / Details: grossly 3+/5    Cervical / Trunk Assessment Cervical / Trunk Assessment: Kyphotic  Communication   Communication: No difficulties  Cognition Arousal/Alertness: Awake/alert Behavior During Therapy: WFL for tasks assessed/performed Overall Cognitive Status: Within Functional Limits for tasks assessed                                        General Comments      Exercises     Assessment/Plan    PT Assessment Patient needs continued PT services  PT Problem List Decreased strength;Decreased range of motion;Decreased activity tolerance;Decreased safety awareness;Decreased balance;Pain;Decreased mobility       PT Treatment Interventions DME instruction;Balance training;Gait training;Neuromuscular re-education;Stair training;Functional mobility training;Patient/family education;Therapeutic activities;Therapeutic exercise    PT Goals (Current goals can be found in the Care Plan section)  Acute Rehab PT Goals Patient Stated Goal: Patient would like to return home safely PT Goal Formulation: With patient Time For Goal Achievement: 04/01/18 Potential to Achieve Goals: Good    Frequency Min 2X/week   Barriers to discharge        Co-evaluation               AM-PAC PT "6 Clicks" Daily Activity  Outcome Measure Difficulty turning over in bed (including adjusting bedclothes, sheets and blankets)?: A Little Difficulty moving from lying on back to sitting on the side of the bed? : Unable Difficulty sitting down on and standing up from a chair with arms  (e.g., wheelchair, bedside commode, etc,.)?: Unable Help needed moving to and from a bed to chair (including a wheelchair)?: A Little Help needed walking in hospital room?: A Little Help needed climbing 3-5 steps with a railing? : A Little 6 Click Score: 14    End of Session Equipment Utilized During Treatment: Gait belt Activity Tolerance: Patient tolerated treatment well Patient left: with chair alarm set;in chair;with call bell/phone within reach Nurse Communication: Mobility status PT Visit Diagnosis: Unsteadiness on feet (R26.81);Other abnormalities of gait and mobility (R26.89);Muscle weakness (generalized) (M62.81);History of falling (Z91.81)    Time: 1010-1039 PT Time Calculation (min) (ACUTE ONLY): 29 min   Charges:   PT Evaluation $PT Eval Low Complexity: 1 Low PT Treatments $Therapeutic Activity: 8-22 mins      Lieutenant Diego PT, DPT 10:57 AM,03/18/18 [phone]

## 2018-03-18 NOTE — Progress Notes (Addendum)
[facility] NAME: [name]    MR#:  [id number]  DATE OF BIRTH:  Apr 23, 1927  SUBJECTIVE:  CHIEF COMPLAINT:   Chief Complaint  Patient presents with  . Abdominal Pain   -Admitted with a staring episode and near syncope. -Work-up showing left carotid stenosis.  EEG is pending  REVIEW OF SYSTEMS:  Review of Systems  Constitutional: Negative for chills, fever and malaise/fatigue.  HENT: Negative for congestion, ear discharge, hearing loss and nosebleeds.   Eyes: Negative for blurred vision and double vision.  Respiratory: Negative for cough, shortness of breath and wheezing.   Cardiovascular: Negative for chest pain, palpitations and leg swelling.  Gastrointestinal: Negative for abdominal pain, constipation, diarrhea, nausea and vomiting.  Genitourinary: Negative for dysuria.  Musculoskeletal: Negative for myalgias.  Neurological: Negative for dizziness, focal weakness, seizures, weakness and headaches.  Psychiatric/Behavioral: Negative for depression.    DRUG ALLERGIES:   Allergies  Allergen Reactions  . Sulfa Antibiotics     VITALS:  Blood pressure (!) 170/72, pulse 69, temperature 98.2 F (36.8 C), temperature source Oral, resp. rate 16, height 5\' 2"  (1.575 m), weight 73 kg (161 lb), SpO2 97 %.  PHYSICAL EXAMINATION:  Physical Exam  GENERAL:  84 y.o.-year-old patient lying in the bed with no acute distress.  EYES: Pupils equal, round, reactive to light and accommodation. No scleral icterus. Extraocular muscles intact.  HEENT: Head atraumatic, normocephalic. Oropharynx and nasopharynx clear.  NECK:  Supple, no jugular venous distention. No thyroid enlargement, no tenderness.  LUNGS: Normal breath sounds bilaterally, no wheezing, rales,rhonchi or crepitation. No use of accessory muscles of respiration.  CARDIOVASCULAR: S1, S2 normal. No  rubs, or gallops. 2/6 systolic murmur present ABDOMEN: Soft, nontender,  nondistended. Bowel sounds present. No organomegaly or mass.  EXTREMITIES: No pedal edema, cyanosis, or clubbing.  NEUROLOGIC: Cranial nerves II through XII are intact. Muscle strength 5/5 in all extremities. Sensation intact. Gait not checked.  PSYCHIATRIC: The patient is alert and oriented x 3.  SKIN: No obvious rash, lesion, or ulcer.    LABORATORY PANEL:   CBC Recent Labs  Lab 03/18/18 0259  WBC 8.7  HGB 9.7*  HCT 28.3*  PLT 208   ------------------------------------------------------------------------------------------------------------------  Chemistries  Recent Labs  Lab 03/17/18 1359 03/18/18 0259  NA 140 143  K 4.6 4.2  CL 108 110  CO2 25 25  GLUCOSE 158* 92  BUN 37* 36*  CREATININE 1.45* 1.41*  CALCIUM 9.0 8.9  AST 24  --   ALT 12  --   ALKPHOS 64  --   BILITOT 0.5  --    ------------------------------------------------------------------------------------------------------------------  Cardiac Enzymes Recent Labs  Lab 03/18/18 0259  TROPONINI 0.05*   ------------------------------------------------------------------------------------------------------------------  RADIOLOGY:  Ct Head Wo Contrast  Result Date: 03/17/2018 CLINICAL DATA:  Confusion, hypoglycemia EXAM: CT HEAD WITHOUT CONTRAST TECHNIQUE: Contiguous axial images were obtained from the base of the skull through the vertex without intravenous contrast. COMPARISON:  04/05/2014 FINDINGS: Brain: No evidence of acute infarction, hemorrhage, hydrocephalus, extra-axial collection or mass lesion/mass effect. Encephalomalacic changes related to old right parietal infarct (series 2/image 15). Subcortical white matter and periventricular small vessel ischemic changes. Vascular: Intracranial atherosclerosis. Skull: Normal. Negative for fracture or focal lesion. Sinuses/Orbits: The visualized paranasal sinuses are essentially clear. The mastoid air cells are unopacified. Other: None. IMPRESSION: No evidence  of acute intracranial abnormality. Encephalomalacic changes related to old right parietal infarct. Small vessel ischemic changes. Electronically Signed   By:  Julian Hy M.D.   On: 03/17/2018 15:01   Mr Brain Wo Contrast  Result Date: 03/17/2018 CLINICAL DATA:  Mental status changes today. Lightheaded. Confusion. EXAM: MRI HEAD WITHOUT CONTRAST TECHNIQUE: Multiplanar, multiecho pulse sequences of the brain and surrounding structures were obtained without intravenous contrast. COMPARISON:  Head CT 03/17/2018 FINDINGS: Brain: Diffusion imaging does not show any acute or subacute infarction. Brainstem and cerebellum are normal. There chronic small-vessel ischemic changes of the cerebral hemispheric white matter. There is an old right frontal cortical and subcortical infarction. No evidence of mass lesion, hemorrhage, hydrocephalus or extra-axial collection. Vascular: Major vessels at the base of the brain show flow. Skull and upper cervical spine: Negative Sinuses/Orbits: Clear/normal Other: None IMPRESSION: No acute or reversible finding. Chronic small-vessel ischemic changes present throughout the brain. Old right posterior frontal cortical and subcortical infarction. Electronically Signed   By: Nelson Chimes M.D.   On: 03/17/2018 19:10   US Carotid Bilateral  Result Date: 03/18/2018 CLINICAL DATA:  Syncope, hypertension, prior cerebral infarction, hyperlipidemia and history of right carotid endarterectomy. EXAM: BILATERAL CAROTID DUPLEX ULTRASOUND TECHNIQUE: Pearline Cables scale imaging, color Doppler and duplex ultrasound were performed of bilateral carotid and vertebral arteries in the neck. COMPARISON:  04/06/2014 FINDINGS: Criteria: Quantification of carotid stenosis is based on velocity parameters that correlate the residual internal carotid diameter with NASCET-based stenosis levels, using the diameter of the distal internal carotid lumen as the denominator for stenosis measurement. The following velocity  measurements were obtained: RIGHT ICA:  130/22 cm/sec CCA:  426/83 cm/sec SYSTOLIC ICA/CCA RATIO:  1.1 ECA:  120 cm/sec LEFT ICA:  456/74 cm/sec CCA:  419/62 cm/sec SYSTOLIC ICA/CCA RATIO:  4.5 ECA:  209 cm/sec RIGHT CAROTID ARTERY: Stable mild intimal thickening at the level of prior carotid endarterectomy. No evidence of recurrent plaque or carotid stenosis. No evidence of ICA stenosis. Moderate ICA tortuosity. RIGHT VERTEBRAL ARTERY: Antegrade flow with normal waveform and velocity. LEFT CAROTID ARTERY: Significant calcified plaque again noted at the level of the carotid bulb and extending into both internal and external carotid arteries. Plaque in the proximal left ICA appears slightly more prominent compared to the 2015 study. Velocities are also increased with maximal measured velocity of 456 cm/sec compared to 309 cm/sec on the prior study. Turbulent flow is present. Findings correspond to a greater than 70% left ICA stenosis which is likely now approaching critical stenosis. LEFT VERTEBRAL ARTERY: Antegrade flow with normal waveform and velocity. IMPRESSION: 1. More prominent plaque burden by grayscale imaging in the proximal left ICA with corresponding increase in measured velocity since 2015, as above. Findings are consistent with greater than 70% left ICA stenosis which is now likely approaching a critical stenosis. 2. No evidence of recurrent right carotid stenosis after prior endarterectomy. Electronically Signed   By: Aletta Edouard M.D.   On: 03/18/2018 11:18   Dg Chest Port 1 View  Result Date: 03/17/2018 CLINICAL DATA:  Abdominal pain EXAM: PORTABLE CHEST 1 VIEW COMPARISON:  04/05/2014 FINDINGS: Lungs are clear.  No pleural effusion or pneumothorax. Cardiomegaly. IMPRESSION: No evidence of acute cardiopulmonary disease. Electronically Signed   By: Julian Hy M.D.   On: 03/17/2018 15:01    EKG:   Orders placed or performed during the hospital encounter of 03/17/18  . ED EKG  . ED  EKG  . EKG 12-Lead  . EKG 12-Lead  . EKG 12-Lead  . EKG 12-Lead    ASSESSMENT AND PLAN:   84 year old female with known past medical history significant  for arthritis, CKD stage III, hypertension, hyperlipidemia and stroke presents to hospital secondary to a near syncopal episode  1.  Staring spells/near syncopal episode-initially thought to be vasovagal as happened on the commode while straining -MRI of the brain negative for acute infarcts.  Carotid Dopplers however showing critical left ICA stenosis -Echocardiogram with EF of 55% -Appreciate neurology consult -PT recommended home health. -EEG is pending to rule out seizures -Recent on Plavix and a statin.  2.  Left ICA stenosis-known history of left ICA stenosis.  Followed by vascular every 6 months. -Ultrasound showing worsening plaque buildup, CT angiogram ordered -Vascular consult for the same.  Patient already on Plavix and Statin  3.  Hypertension-continue home medications.  Patient on clonidine patch, Norvasc, benazepril  4.  Acute cystitis-urine cultures are pending.  Started on Rocephin today  5.  DVT prophylaxis-subcutaneous heparin  6.  Anemia-anemia of chronic disease.  Hemoglobin is stable at 9.7. -Indication for transfusion.  Continue iron supplements orally.  7.  Elevated troponin-demand ischemia.  Echo with no wall motion abnormalities.  Monitor  Patient is ambulatory at baseline with a walker.  Physical therapy consulted and they have recommended home health.    All the records are reviewed and case discussed with Care Management/Social Workerr. Management plans discussed with the patient, family and they are in agreement.  CODE STATUS: DNR  TOTAL TIME TAKING CARE OF THIS PATIENT: 38 minutes.   POSSIBLE D/C IN 1-2 DAYS, DEPENDING ON CLINICAL CONDITION.   Happy Begeman M.D on 03/18/2018 at 3:56 PM  Between 7am to 6pm - Pager - [phone]  After 6pm go to [email or web address] - password [email or web address]  [facility]  Office  [phone]  CC: Primary care physician; Lavera Guise, MD

## 2018-03-18 NOTE — Consult Note (Signed)
[facility] Vascular Consult Note  MRN : [id number]  SHANECE COCHRANE is a 84 y.o. (September 09, 1926) female who presents with chief complaint of  Chief Complaint  Patient presents with  . Abdominal Pain  .  History of Present Illness: I am asked by Dr. Tressia Miners to see the patient for carotid stenosis.  Had a staring episode with syncope.  Second one in several months.  Had similar episodes before right CEA ten years ago.  No residual symptoms.  No arm or leg weakness or numbness, no fever or chills, no speech or swallowing difficulties, no visual symptoms.  CTA shows progression of left carotid disease now in the high grade near occlusive range.  Right CEA widely patent. I have independently reviewed the CT scan. Very poor anatomy for carotid stenting with extensive calcification and marked tortuosity.  Current Facility-Administered Medications  Medication Dose Route Frequency Provider Last Rate Last Dose  . allopurinol (ZYLOPRIM) tablet 100 mg  100 mg Oral QHS Vaughan Basta, MD   100 mg at 03/17/18 2218  . ALPRAZolam Duanne Moron) tablet 0.25 mg  0.25 mg Oral QHS PRN Vaughan Basta, MD   0.25 mg at 03/17/18 2217  . amLODipine (NORVASC) tablet 2.5 mg  2.5 mg Oral QHS Vaughan Basta, MD   2.5 mg at 03/18/18 1603  . benazepril (LOTENSIN) tablet 5 mg  5 mg Oral BID Vaughan Basta, MD   5 mg at 03/18/18 1048  . cefTRIAXone (ROCEPHIN) 1 g in sodium chloride 0.9 % 100 mL IVPB  1 g Intravenous Q24H Gladstone Lighter, MD   Stopped at 03/18/18 1532  . cholecalciferol (VITAMIN D) tablet 2,000 Units  2,000 Units Oral Daily Vaughan Basta, MD   2,000 Units at 03/18/18 1048  . [START ON 03/23/2018] cloNIDine (CATAPRES - Dosed in mg/24 hr) patch 0.2 mg  0.2 mg Transdermal Weekly Vaughan Basta, MD      . clopidogrel (PLAVIX) tablet 75 mg  75 mg Oral Daily Vaughan Basta, MD   75 mg at 03/18/18 1048  . docusate sodium (COLACE) capsule 100  mg  100 mg Oral BID PRN Vaughan Basta, MD      . Derrill Memo ON 03/19/2018] ferrous sulfate tablet 325 mg  325 mg Oral Q breakfast Vaughan Basta, MD      . fluticasone (FLONASE) 50 MCG/ACT nasal spray 1 spray  1 spray Each Nare Daily PRN Vaughan Basta, MD      . heparin injection 5,000 Units  5,000 Units Subcutaneous Q8H Vaughan Basta, MD   5,000 Units at 03/18/18 0529  . multivitamin with minerals tablet 1 tablet  1 tablet Oral Daily Vaughan Basta, MD   1 tablet at 03/18/18 1048  . pantoprazole (PROTONIX) EC tablet 40 mg  40 mg Oral Daily Vaughan Basta, MD   40 mg at 03/18/18 1048  . pravastatin (PRAVACHOL) tablet 20 mg  20 mg Oral Daily Vaughan Basta, MD   20 mg at 03/18/18 1048    Past Medical History:  Diagnosis Date  . Allergy   . Arthritis   . CKD (chronic kidney disease), stage III ([place])   . GERD (gastroesophageal reflux disease)   . Hyperlipidemia   . Hypertension   . Insomnia   . Stroke [facility])     Past Surgical History:  Procedure Laterality Date  . APPENDECTOMY    . BACK SURGERY    . CAROTID ENDARTERECTOMY Right   . CHOLECYSTECTOMY    . EYE SURGERY Bilateral   . FOOT SURGERY  Left   . HERNIA REPAIR    . HIP SURGERY Left   . TONSILLECTOMY      Social History Social History   Tobacco Use  . Smoking status: Never Smoker  . Smokeless tobacco: Never Used  Substance Use Topics  . Alcohol use: No  . Drug use: No    Family History Family History  Problem Relation Age of Onset  . Congestive Heart Failure Mother   . Heart disease Mother   . Heart disease Father   . Diabetes Brother     Allergies  Allergen Reactions  . Sulfa Antibiotics      REVIEW OF SYSTEMS (Negative unless checked)  Constitutional: [] Weight loss  [] Fever  [] Chills Cardiac: [] Chest pain   [] Chest pressure   [] Palpitations   [] Shortness of breath when laying flat   [] Shortness of breath at rest   [] Shortness of breath with  exertion. Vascular:  [] Pain in legs with walking   [] Pain in legs at rest   [] Pain in legs when laying flat   [] Claudication   [] Pain in feet when walking  [] Pain in feet at rest  [] Pain in feet when laying flat   [] History of DVT   [] Phlebitis   [] Swelling in legs   [] Varicose veins   [] Non-healing ulcers Pulmonary:   [] Uses home oxygen   [] Productive cough   [] Hemoptysis   [] Wheeze  [] COPD   [] Asthma Neurologic:  [x] Dizziness  [x] Blackouts   [] Seizures   [x] History of stroke   [x] History of TIA  [] Aphasia   [] Temporary blindness   [] Dysphagia   [] Weakness or numbness in arms   [] Weakness or numbness in legs Musculoskeletal:  [x] Arthritis   [] Joint swelling   [x] Joint pain   [] Low back pain Hematologic:  [] Easy bruising  [] Easy bleeding   [] Hypercoagulable state   [] Anemic  [] Hepatitis Gastrointestinal:  [] Blood in stool   [] Vomiting blood  [] Gastroesophageal reflux/heartburn   [] Difficulty swallowing. Genitourinary:  [x] Chronic kidney disease   [] Difficult urination  [] Frequent urination  [] Burning with urination   [] Blood in urine Skin:  [] Rashes   [] Ulcers   [] Wounds Psychological:  [] History of anxiety   []  History of major depression.  Physical Examination  Vitals:   03/17/18 1732 03/17/18 1914 03/18/18 0429 03/18/18 1042  BP: (!) 174/86 (!) 181/76 140/64 (!) 170/72  Pulse: 90 87 69   Resp: 18 15 16    Temp: 98.6 F (37 C) 98.2 F (36.8 C) 98.2 F (36.8 C)   TempSrc: Oral Oral Oral   SpO2: 99% 100% 97%   Weight:      Height:       Body mass index is 29.45 kg/m. Gen:  WD/WN, NAD. Appears younger than stated age. Head: [place]/AT, No temporalis wasting.  Ear/Nose/Throat: Hearing somewhat diminished, nares w/o erythema or drainage, oropharynx w/o Erythema/Exudate Eyes: Sclera non-icteric, conjunctiva clear Neck: Trachea midline.  No JVD.  Pulmonary:  Good air movement, respirations not labored, equal bilaterally.  Cardiac: RRR, normal S1, S2. Vascular:  Vessel Right Left  Radial  Palpable Palpable                                    Musculoskeletal: M/S 5/5 throughout.  Extremities without ischemic changes.  No deformity or atrophy. Neurologic: Sensation grossly intact in extremities.  Symmetrical.  Speech is fluent. Motor exam as listed above. Psychiatric: Judgment intact, Mood & affect appropriate for pt's clinical situation. Dermatologic:  No rashes or ulcers noted.  No cellulitis or open wounds.       CBC Lab Results  Component Value Date   WBC 8.7 03/18/2018   HGB 9.7 (L) 03/18/2018   HCT 28.3 (L) 03/18/2018   MCV 99.1 03/18/2018   PLT 208 03/18/2018    BMET    Component Value Date/Time   NA 143 03/18/2018 0259   NA 138 11/21/2017 1132   NA 142 04/07/2014 0409   K 4.2 03/18/2018 0259   K 4.0 04/07/2014 0409   CL 110 03/18/2018 0259   CL 107 04/07/2014 0409   CO2 25 03/18/2018 0259   CO2 26 04/07/2014 0409   GLUCOSE 92 03/18/2018 0259   GLUCOSE 87 04/07/2014 0409   BUN 36 (H) 03/18/2018 0259   BUN 35 (H) 11/21/2017 1132   BUN 26 (H) 04/07/2014 0409   CREATININE 1.41 (H) 03/18/2018 0259   CREATININE 1.43 (H) 04/07/2014 0409   CALCIUM 8.9 03/18/2018 0259   CALCIUM 8.8 04/07/2014 0409   GFRNONAA 32 (L) 03/18/2018 0259   GFRNONAA 33 (L) 04/07/2014 0409   GFRAA 37 (L) 03/18/2018 0259   GFRAA 39 (L) 04/07/2014 0409   Estimated Creatinine Clearance: 25.3 mL/min (A) (by C-G formula based on SCr of 1.41 mg/dL (H)).  COAG Lab Results  Component Value Date   INR 1.0 06/19/2013    Radiology Ct Head Wo Contrast  Result Date: 03/17/2018 CLINICAL DATA:  Confusion, hypoglycemia EXAM: CT HEAD WITHOUT CONTRAST TECHNIQUE: Contiguous axial images were obtained from the base of the skull through the vertex without intravenous contrast. COMPARISON:  04/05/2014 FINDINGS: Brain: No evidence of acute infarction, hemorrhage, hydrocephalus, extra-axial collection or mass lesion/mass effect. Encephalomalacic changes related to old right parietal  infarct (series 2/image 15). Subcortical white matter and periventricular small vessel ischemic changes. Vascular: Intracranial atherosclerosis. Skull: Normal. Negative for fracture or focal lesion. Sinuses/Orbits: The visualized paranasal sinuses are essentially clear. The mastoid air cells are unopacified. Other: None. IMPRESSION: No evidence of acute intracranial abnormality. Encephalomalacic changes related to old right parietal infarct. Small vessel ischemic changes. Electronically Signed   By: Julian Hy M.D.   On: 03/17/2018 15:01   Ct Angio Neck W Or Wo Contrast  Result Date: 03/18/2018 CLINICAL DATA:  Carotid stenosis. EXAM: CT ANGIOGRAPHY NECK TECHNIQUE: Multidetector CT imaging of the neck was performed using the standard protocol during bolus administration of intravenous contrast. Multiplanar CT image reconstructions and MIPs were obtained to evaluate the vascular anatomy. Carotid stenosis measurements (when applicable) are obtained utilizing NASCET criteria, using the distal internal carotid diameter as the denominator. CONTRAST:  58mL ISOVUE-370 IOPAMIDOL (ISOVUE-370) INJECTION 76% COMPARISON:  CT of the neck 04/06/2014. FINDINGS: Aortic arch: Atherosclerotic calcifications are present at the aortic arch. A 3 vessel arch configuration is present. There is no focal stenosis or aneurysm. Right carotid system: The right common carotid artery is within normal limits. Atherosclerotic irregularity is present at the right carotid bifurcation without significant stenosis relative to the more distal vessel. Moderate right-sided tortuosity is again seen without significant stenosis. Left carotid system: The left common carotid artery is within normal limits. Progressive atherosclerotic disease present with a high-grade, near complete stenosis of the proximal left internal carotid artery. The cervical left ICA measures 4.3 mm at the level of C2 compared to 5.0 mm on the right. Vertebral arteries: Left  vertebral artery is dominant to the right. Both vertebral arteries originate from the subclavian arteries without significant stenosis. There is no significant stenosis of  either vertebral artery in the neck. No vascular injury is evident. Skeleton: Multilevel endplate degenerative changes are again noted in the cervical spine. Grade 1 anterolisthesis at C3-4 is stable. AP alignment is otherwise anatomic. Vertebral body heights are maintained. Other neck: The soft tissues of the neck are otherwise unremarkable. A 5 mm right thyroid nodule is present. Upper chest: Lung apices are clear. IMPRESSION: 1. Progressive high-grade, near occlusive, stenosis of the left common carotid artery with asymmetric decreased caliber of the more distal left internal carotid artery indicating the extent of proximal stenosis. 2. Atherosclerotic changes of the right carotid bifurcation without significant stenosis. 3. Stable appearance of multilevel degenerative changes of the cervical spine with grade 1 anterolisthesis at C3-4. Electronically Signed   By: San Morelle M.D.   On: 03/18/2018 16:20   Mr Brain Wo Contrast  Result Date: 03/17/2018 CLINICAL DATA:  Mental status changes today. Lightheaded. Confusion. EXAM: MRI HEAD WITHOUT CONTRAST TECHNIQUE: Multiplanar, multiecho pulse sequences of the brain and surrounding structures were obtained without intravenous contrast. COMPARISON:  Head CT 03/17/2018 FINDINGS: Brain: Diffusion imaging does not show any acute or subacute infarction. Brainstem and cerebellum are normal. There chronic small-vessel ischemic changes of the cerebral hemispheric white matter. There is an old right frontal cortical and subcortical infarction. No evidence of mass lesion, hemorrhage, hydrocephalus or extra-axial collection. Vascular: Major vessels at the base of the brain show flow. Skull and upper cervical spine: Negative Sinuses/Orbits: Clear/normal Other: None IMPRESSION: No acute or reversible  finding. Chronic small-vessel ischemic changes present throughout the brain. Old right posterior frontal cortical and subcortical infarction. Electronically Signed   By: Nelson Chimes M.D.   On: 03/17/2018 19:10   US Carotid Bilateral  Result Date: 03/18/2018 CLINICAL DATA:  Syncope, hypertension, prior cerebral infarction, hyperlipidemia and history of right carotid endarterectomy. EXAM: BILATERAL CAROTID DUPLEX ULTRASOUND TECHNIQUE: Pearline Cables scale imaging, color Doppler and duplex ultrasound were performed of bilateral carotid and vertebral arteries in the neck. COMPARISON:  04/06/2014 FINDINGS: Criteria: Quantification of carotid stenosis is based on velocity parameters that correlate the residual internal carotid diameter with NASCET-based stenosis levels, using the diameter of the distal internal carotid lumen as the denominator for stenosis measurement. The following velocity measurements were obtained: RIGHT ICA:  130/22 cm/sec CCA:  242/35 cm/sec SYSTOLIC ICA/CCA RATIO:  1.1 ECA:  120 cm/sec LEFT ICA:  456/74 cm/sec CCA:  361/44 cm/sec SYSTOLIC ICA/CCA RATIO:  4.5 ECA:  209 cm/sec RIGHT CAROTID ARTERY: Stable mild intimal thickening at the level of prior carotid endarterectomy. No evidence of recurrent plaque or carotid stenosis. No evidence of ICA stenosis. Moderate ICA tortuosity. RIGHT VERTEBRAL ARTERY: Antegrade flow with normal waveform and velocity. LEFT CAROTID ARTERY: Significant calcified plaque again noted at the level of the carotid bulb and extending into both internal and external carotid arteries. Plaque in the proximal left ICA appears slightly more prominent compared to the 2015 study. Velocities are also increased with maximal measured velocity of 456 cm/sec compared to 309 cm/sec on the prior study. Turbulent flow is present. Findings correspond to a greater than 70% left ICA stenosis which is likely now approaching critical stenosis. LEFT VERTEBRAL ARTERY: Antegrade flow with normal waveform  and velocity. IMPRESSION: 1. More prominent plaque burden by grayscale imaging in the proximal left ICA with corresponding increase in measured velocity since 2015, as above. Findings are consistent with greater than 70% left ICA stenosis which is now likely approaching a critical stenosis. 2. No evidence of recurrent right carotid stenosis  after prior endarterectomy. Electronically Signed   By: Aletta Edouard M.D.   On: 03/18/2018 11:18   Dg Chest Port 1 View  Result Date: 03/17/2018 CLINICAL DATA:  Abdominal pain EXAM: PORTABLE CHEST 1 VIEW COMPARISON:  04/05/2014 FINDINGS: Lungs are clear.  No pleural effusion or pneumothorax. Cardiomegaly. IMPRESSION: No evidence of acute cardiopulmonary disease. Electronically Signed   By: Julian Hy M.D.   On: 03/17/2018 15:01      Assessment/Plan 1. Worsening left carotid stenosis, now high grade.  Also with symptoms of syncope and possible TIA.  EEG pending.  Very poor anatomy for carotid stenting with extensive calcification and marked tortuosity.  Would likely benefit from CEA but with advanced age and other comorbidities will be a high risk patient.  Patient to consider options and I will reassess with her tomorrow.  2. Right carotid stenosis s/p CEA 10 years ago and doing well on CT. 3. HTN. Stable on outpatient medication and blood pressure control important in reducing the progression of atherosclerotic disease. On appropriate oral medications.    Leotis Pain, MD  03/18/2018 6:24 PM    This note was created with Dragon medical transcription system.  Any error is purely unintentional

## 2018-03-18 NOTE — Plan of Care (Signed)

## 2018-03-18 NOTE — Consult Note (Signed)
Reason for Consult:Syncope Referring Physician: Tressia Miners  CC: Syncope  HPI: [name] is an 84 y.o. female who is amnestic of presenting event.  Family not available therefore all history obtained from the chart.  Yesterday the patient went to the bathroom and was having a hard stool initially after that it became loose and then partially watery.  She was also making some sounds so daughter went in to check on her, she was sitting on the commode and holding tight but was not responding to her daughter.  Her eyes were open and she did not fall down.  Daughter did not see any jerking movements.  Patient kept staring at her without talking to her.  Daughter reported that this was similar to her presentation for a stroke a few years ago therefore she called EMS.  Patient now at baseline.  Lives with daughter.  Uses a walker for ambulation.     Past Medical History:  Diagnosis Date  . Allergy   . Arthritis   . CKD (chronic kidney disease), stage III ([place])   . GERD (gastroesophageal reflux disease)   . Hyperlipidemia   . Hypertension   . Insomnia   . Stroke [facility])     Past Surgical History:  Procedure Laterality Date  . APPENDECTOMY    . BACK SURGERY    . CAROTID ENDARTERECTOMY Right   . CHOLECYSTECTOMY    . EYE SURGERY Bilateral   . FOOT SURGERY Left   . HERNIA REPAIR    . HIP SURGERY Left   . TONSILLECTOMY      Family History  Problem Relation Age of Onset  . Congestive Heart Failure Mother   . Heart disease Mother   . Heart disease Father   . Diabetes Brother     Social History:  reports that she has never smoked. She has never used smokeless tobacco. She reports that she does not drink alcohol or use drugs.  Allergies  Allergen Reactions  . Sulfa Antibiotics     Medications:  I have reviewed the patient's current medications. Prior to Admission:  Medications Prior to Admission  Medication Sig Dispense Refill Last Dose  . allopurinol (ZYLOPRIM) 100 MG tablet  take 2 tablets by mouth at bedtime for gout 60 tablet 6 03/16/2018 at 2000  . ALPRAZolam (XANAX) 0.25 MG tablet take 1 tablet by mouth at bedtime if needed for anxiety (Patient taking differently: take 1 tablet by mouth at bedtime) 30 tablet 2 03/16/2018 at 2100  . amLODipine (NORVASC) 2.5 MG tablet take 1 tablet by mouth at bedtime (Patient taking differently: take 1 tablet by mouth at lunch) 30 tablet 6 03/17/2018 at 1200  . benazepril (LOTENSIN) 5 MG tablet take 1 tablet by mouth twice a day 60 tablet 6 03/17/2018 at 0800  . Cholecalciferol (VITAMIN D3) 2000 units capsule Take 2,000 Units by mouth daily.    03/17/2018 at 0800  . cloNIDine (CATAPRES - DOSED IN MG/24 HR) 0.2 mg/24hr patch apply 1 patch every week 4 patch 5 03/16/2018 at 0800  . clopidogrel (PLAVIX) 75 MG tablet take 1 tablet by mouth once daily 30 tablet 6 03/17/2018 at 0800  . conjugated estrogens (PREMARIN) vaginal cream Place 1 Applicatorful vaginally once a week. At night.   Past Week at Unknown time  . FEROSUL 325 (65 Fe) MG tablet take 1 tablet by mouth twice a day 60 tablet 5 03/17/2018 at 0800  . fluticasone (FLONASE) 50 MCG/ACT nasal spray Place 1 spray into both nostrils  daily as needed for allergies.    PRN at PRN  . Multiple Vitamins-Minerals (CENTRUM SILVER 50+WOMEN) TABS Take 1 tablet by mouth daily.   03/17/2018 at 0800  . omeprazole (PRILOSEC) 20 MG capsule take 1 capsule by mouth once daily FOR BELCHING AND STOMACH. 30 capsule 5 03/17/2018 at 0800  . pravastatin (PRAVACHOL) 20 MG tablet take 1 tablet by mouth once daily 30 tablet 6 03/16/2018 at 2000  . triamcinolone (KENALOG) 0.025 % cream Apply 1 application topically 2 (two) times daily. 80 g 2 03/17/2018 at 0800  . VOLTAREN 1 % GEL apply 2 grams to affected area twice a day 200 g 12 03/17/2018 at 0800   Scheduled: . allopurinol  100 mg Oral QHS  . amLODipine  2.5 mg Oral QHS  . benazepril  5 mg Oral BID  . cholecalciferol  2,000 Units Oral Daily  . [START ON 03/23/2018] cloNIDine   0.2 mg Transdermal Weekly  . clopidogrel  75 mg Oral Daily  . [START ON 03/19/2018] ferrous sulfate  325 mg Oral Q breakfast  . heparin  5,000 Units Subcutaneous Q8H  . multivitamin with minerals  1 tablet Oral Daily  . pantoprazole  40 mg Oral Daily  . pravastatin  20 mg Oral Daily    ROS: History obtained from the patient  General ROS: negative for - chills, fatigue, fever, night sweats, weight gain or weight loss Psychological ROS: negative for - behavioral disorder, hallucinations, memory difficulties, mood swings or suicidal ideation Ophthalmic ROS: negative for - blurry vision, double vision, eye pain or loss of vision ENT ROS: negative for - epistaxis, nasal discharge, oral lesions, sore throat, tinnitus or vertigo Allergy and Immunology ROS: negative for - hives or itchy/watery eyes Hematological and Lymphatic ROS: negative for - bleeding problems, bruising or swollen lymph nodes Endocrine ROS: negative for - galactorrhea, hair pattern changes, polydipsia/polyuria or temperature intolerance Respiratory ROS: negative for - cough, hemoptysis, shortness of breath or wheezing Cardiovascular ROS: negative for - chest pain, dyspnea on exertion, edema or irregular heartbeat Gastrointestinal ROS: negative for - abdominal pain, diarrhea, hematemesis, nausea/vomiting or stool incontinence Genito-Urinary ROS: negative for - dysuria, hematuria, incontinence or urinary frequency/urgency Musculoskeletal ROS: arthritic pain in multiple joints, poor use of left leg   Neurological ROS: as noted in HPI Dermatological ROS: negative for rash and skin lesion changes  Physical Examination: Blood pressure (!) 170/72, pulse 69, temperature 98.2 F (36.8 C), temperature source Oral, resp. rate 16, height 5\' 2"  (1.575 m), weight 73 kg (161 lb), SpO2 97 %.  HEENT-  Normocephalic, no lesions, without obvious abnormality.  Normal external eye and conjunctiva.  Normal TM's bilaterally.  Normal auditory canals  and external ears. Normal external nose, mucus membranes and septum.  Normal pharynx. Cardiovascular- S1, S2 normal, pulses palpable throughout   Lungs- chest clear, no wheezing, rales, normal symmetric air entry Abdomen- soft, non-tender; bowel sounds normal; no masses,  no organomegaly Extremities- no edema Lymph-no adenopathy palpable Musculoskeletal-bony deformities in the legs, left greater than right Skin-warm and dry, no hyperpigmentation, vitiligo, or suspicious lesions  Neurological Examination   Mental Status: Alert, oriented, thought content appropriate.  Speech fluent without evidence of aphasia.  Able to follow 3 step commands without difficulty. Cranial Nerves: II: Discs flat bilaterally; Visual fields grossly normal, pupils equal, round, reactive to light and accommodation III,IV, VI: ptosis not present, extra-ocular motions intact bilaterally V,VII: smile symmetric, facial light touch sensation normal bilaterally VIII: hearing normal bilaterally IX,X: gag reflex present  XI: bilateral shoulder shrug XII: midline tongue extension Motor: Right : Upper extremity   5/5    Left:     Upper extremity   5/5  Lower extremity   5/5     Lower extremity   2-3/5 Decreased ROM at the shoulders.   Sensory: Pinprick and light touch intact throughout, bilaterally Deep Tendon Reflexes: 2+ in the upper extremities and absent in the lower extremities Plantars: Right: mute   Left: mute Cerebellar: Normal finger-to-nose and normal heel-to-shin testing bilaterally Gait: not tested due to safety concerns   Laboratory Studies:   Basic Metabolic Panel: Recent Labs  Lab 03/17/18 1359 03/18/18 0259  NA 140 143  K 4.6 4.2  CL 108 110  CO2 25 25  GLUCOSE 158* 92  BUN 37* 36*  CREATININE 1.45* 1.41*  CALCIUM 9.0 8.9    Liver Function Tests: Recent Labs  Lab 03/17/18 1359  AST 24  ALT 12  ALKPHOS 64  BILITOT 0.5  PROT 6.1*  ALBUMIN 3.5   Recent Labs  Lab 03/17/18 1359   LIPASE 34   No results for input(s): AMMONIA in the last 168 hours.  CBC: Recent Labs  Lab 03/17/18 1359 03/18/18 0259  WBC 7.0 8.7  NEUTROABS 4.8  --   HGB 8.1* 9.7*  HCT 24.2* 28.3*  MCV 99.2 99.1  PLT 172 208    Cardiac Enzymes: Recent Labs  Lab 03/17/18 1359 03/17/18 1812 03/17/18 2211 03/18/18 0259  TROPONINI 0.04* 0.04* 0.04* 0.05*    BNP: Invalid input(s): POCBNP  CBG: No results for input(s): GLUCAP in the last 168 hours.  Microbiology: Results for orders placed or performed in visit on 11/09/17  CULTURE, URINE COMPREHENSIVE     Status: Abnormal   Collection Time: 11/09/17  3:24 PM  Result Value Ref Range Status   Urine Culture, Comprehensive Final report (A)  Final   Organism ID, Bacteria Escherichia coli (A)  Final    Comment: 25,000-50,000 colony forming units per mL Cefazolin <=4 ug/mL Cefazolin with an MIC <=16 predicts susceptibility to the oral agents cefaclor, cefdinir, cefpodoxime, cefprozil, cefuroxime, cephalexin, and loracarbef when used for therapy of uncomplicated urinary tract infections due to E. coli, Klebsiella pneumoniae, and Proteus mirabilis.    ANTIMICROBIAL SUSCEPTIBILITY Comment  Final    Comment:       ** S = Susceptible; I = Intermediate; R = Resistant **                    P = Positive; N = Negative             MICS are expressed in micrograms per mL    Antibiotic                 RSLT#1    RSLT#2    RSLT#3    RSLT#4 Amoxicillin/Clavulanic Acid    S Ampicillin                     R Cefepime                       S Ceftriaxone                    S Cefuroxime                     S Ciprofloxacin  R Ertapenem                      S Gentamicin                     S Imipenem                       S Levofloxacin                   R Meropenem                      S Nitrofurantoin                 S Piperacillin/Tazobactam        S Tetracycline                   R Tobramycin                      S Trimethoprim/Sulfa             R     Coagulation Studies: No results for input(s): LABPROT, INR in the last 72 hours.  Urinalysis:  Recent Labs  Lab 03/18/18 1122  COLORURINE YELLOW*  LABSPEC 1.009  PHURINE 6.0  GLUCOSEU NEGATIVE  HGBUR NEGATIVE  BILIRUBINUR NEGATIVE  KETONESUR NEGATIVE  PROTEINUR 30*  NITRITE NEGATIVE  LEUKOCYTESUR TRACE*    Lipid Panel:     Component Value Date/Time   CHOL 166 11/21/2017 1132   CHOL 164 04/06/2014 0420   TRIG 180 (H) 11/21/2017 1132   TRIG 108 04/06/2014 0420   HDL 45 11/21/2017 1132   HDL 40 04/06/2014 0420   CHOLHDL 3.7 11/21/2017 1132   VLDL 22 04/06/2014 0420   LDLCALC 85 11/21/2017 1132   LDLCALC 102 (H) 04/06/2014 0420    HgbA1C:  Lab Results  Component Value Date   HGBA1C 6.7 (H) 04/06/2014    Urine Drug Screen:  No results found for: LABOPIA, COCAINSCRNUR, LABBENZ, AMPHETMU, THCU, LABBARB  Alcohol Level: No results for input(s): ETH in the last 168 hours.  Other results: EKG: sinus rhythm at 75 bpm.  Imaging: Ct Head Wo Contrast  Result Date: 03/17/2018 CLINICAL DATA:  Confusion, hypoglycemia EXAM: CT HEAD WITHOUT CONTRAST TECHNIQUE: Contiguous axial images were obtained from the base of the skull through the vertex without intravenous contrast. COMPARISON:  04/05/2014 FINDINGS: Brain: No evidence of acute infarction, hemorrhage, hydrocephalus, extra-axial collection or mass lesion/mass effect. Encephalomalacic changes related to old right parietal infarct (series 2/image 15). Subcortical white matter and periventricular small vessel ischemic changes. Vascular: Intracranial atherosclerosis. Skull: Normal. Negative for fracture or focal lesion. Sinuses/Orbits: The visualized paranasal sinuses are essentially clear. The mastoid air cells are unopacified. Other: None. IMPRESSION: No evidence of acute intracranial abnormality. Encephalomalacic changes related to old right parietal infarct. Small vessel ischemic changes.  Electronically Signed   By: Julian Hy M.D.   On: 03/17/2018 15:01   Mr Brain Wo Contrast  Result Date: 03/17/2018 CLINICAL DATA:  Mental status changes today. Lightheaded. Confusion. EXAM: MRI HEAD WITHOUT CONTRAST TECHNIQUE: Multiplanar, multiecho pulse sequences of the brain and surrounding structures were obtained without intravenous contrast. COMPARISON:  Head CT 03/17/2018 FINDINGS: Brain: Diffusion imaging does not show any acute or subacute infarction. Brainstem and cerebellum are normal. There chronic small-vessel ischemic changes of the cerebral hemispheric white matter. There is an old right frontal cortical  and subcortical infarction. No evidence of mass lesion, hemorrhage, hydrocephalus or extra-axial collection. Vascular: Major vessels at the base of the brain show flow. Skull and upper cervical spine: Negative Sinuses/Orbits: Clear/normal Other: None IMPRESSION: No acute or reversible finding. Chronic small-vessel ischemic changes present throughout the brain. Old right posterior frontal cortical and subcortical infarction. Electronically Signed   By: Nelson Chimes M.D.   On: 03/17/2018 19:10   US Carotid Bilateral  Result Date: 03/18/2018 CLINICAL DATA:  Syncope, hypertension, prior cerebral infarction, hyperlipidemia and history of right carotid endarterectomy. EXAM: BILATERAL CAROTID DUPLEX ULTRASOUND TECHNIQUE: Pearline Cables scale imaging, color Doppler and duplex ultrasound were performed of bilateral carotid and vertebral arteries in the neck. COMPARISON:  04/06/2014 FINDINGS: Criteria: Quantification of carotid stenosis is based on velocity parameters that correlate the residual internal carotid diameter with NASCET-based stenosis levels, using the diameter of the distal internal carotid lumen as the denominator for stenosis measurement. The following velocity measurements were obtained: RIGHT ICA:  130/22 cm/sec CCA:  323/55 cm/sec SYSTOLIC ICA/CCA RATIO:  1.1 ECA:  120 cm/sec LEFT ICA:   456/74 cm/sec CCA:  732/20 cm/sec SYSTOLIC ICA/CCA RATIO:  4.5 ECA:  209 cm/sec RIGHT CAROTID ARTERY: Stable mild intimal thickening at the level of prior carotid endarterectomy. No evidence of recurrent plaque or carotid stenosis. No evidence of ICA stenosis. Moderate ICA tortuosity. RIGHT VERTEBRAL ARTERY: Antegrade flow with normal waveform and velocity. LEFT CAROTID ARTERY: Significant calcified plaque again noted at the level of the carotid bulb and extending into both internal and external carotid arteries. Plaque in the proximal left ICA appears slightly more prominent compared to the 2015 study. Velocities are also increased with maximal measured velocity of 456 cm/sec compared to 309 cm/sec on the prior study. Turbulent flow is present. Findings correspond to a greater than 70% left ICA stenosis which is likely now approaching critical stenosis. LEFT VERTEBRAL ARTERY: Antegrade flow with normal waveform and velocity. IMPRESSION: 1. More prominent plaque burden by grayscale imaging in the proximal left ICA with corresponding increase in measured velocity since 2015, as above. Findings are consistent with greater than 70% left ICA stenosis which is now likely approaching a critical stenosis. 2. No evidence of recurrent right carotid stenosis after prior endarterectomy. Electronically Signed   By: Aletta Edouard M.D.   On: 03/18/2018 11:18   Dg Chest Port 1 View  Result Date: 03/17/2018 CLINICAL DATA:  Abdominal pain EXAM: PORTABLE CHEST 1 VIEW COMPARISON:  04/05/2014 FINDINGS: Lungs are clear.  No pleural effusion or pneumothorax. Cardiomegaly. IMPRESSION: No evidence of acute cardiopulmonary disease. Electronically Signed   By: Julian Hy M.D.   On: 03/17/2018 15:01     Assessment/Plan: 84 year old female with history of stroke presenting after an episode of unresponsiveness.  Clinical history suggestive of a vasovagal event.  MRI of the brain reviewed and shows no acute changes.  With  history of stroke will investigate possible epileptic etiology as well.    Recommendations: 1.  EEG 2.  If EEG unremarkable no further neurologic intervention is recommended at this time.  If further questions arise, please call or page at that time.  Thank you for allowing neurology to participate in the care of this patient.  Alexis Goodell, MD Neurology [phone] 03/18/2018, 12:33 PM

## 2018-03-18 NOTE — Care Management (Signed)
Patient placed in observation after questionable loss of consciousness / amnesia.  Found to have ICA stenosis  and vascular consult is pending.  Physical therapy recommending home health.  Patient is currently open to [facility] RN and Aide.  Did receive physical therapy but was able to discharge from that service.  Notified [facility] of observation and the need to have physical therapy services

## 2018-03-19 ENCOUNTER — Observation Stay: Payer: Medicare HMO

## 2018-03-19 DIAGNOSIS — R4020 Unspecified coma: Secondary | ICD-10-CM | POA: Diagnosis not present

## 2018-03-19 DIAGNOSIS — R531 Weakness: Secondary | ICD-10-CM | POA: Diagnosis not present

## 2018-03-19 DIAGNOSIS — I6529 Occlusion and stenosis of unspecified carotid artery: Secondary | ICD-10-CM | POA: Diagnosis not present

## 2018-03-19 DIAGNOSIS — I639 Cerebral infarction, unspecified: Secondary | ICD-10-CM | POA: Diagnosis not present

## 2018-03-19 DIAGNOSIS — I6522 Occlusion and stenosis of left carotid artery: Secondary | ICD-10-CM

## 2018-03-19 DIAGNOSIS — I10 Essential (primary) hypertension: Secondary | ICD-10-CM | POA: Diagnosis not present

## 2018-03-19 DIAGNOSIS — R55 Syncope and collapse: Secondary | ICD-10-CM | POA: Diagnosis not present

## 2018-03-19 LAB — BASIC METABOLIC PANEL
ANION GAP: 6 (ref 5–15)
BUN: 37 mg/dL — ABNORMAL HIGH (ref 8–23)
CHLORIDE: 109 mmol/L (ref 98–111)
CO2: 27 mmol/L (ref 22–32)
Calcium: 9.1 mg/dL (ref 8.9–10.3)
Creatinine, Ser: 1.37 mg/dL — ABNORMAL HIGH (ref 0.44–1.00)
GFR calc non Af Amer: 33 mL/min — ABNORMAL LOW (ref >60)
GFR, EST AFRICAN AMERICAN: 38 mL/min — AB (ref >60)
Glucose, Bld: 96 mg/dL (ref 70–99)
Potassium: 4.5 mmol/L (ref 3.5–5.1)
Sodium: 142 mmol/L (ref 135–145)

## 2018-03-19 NOTE — Procedures (Signed)
History: 84 year old female being evaluated for transient altered mental status  Sedation: None  Technique: This is a 21 channel routine scalp EEG performed at the bedside with bipolar and monopolar montages arranged in accordance to the international 10/20 system of electrode placement. One channel was dedicated to EKG recording.    Background: The background consists of intermixed alpha and beta activities. There is a well defined posterior dominant rhythm of 10 Hz that attenuates with eye opening. Sleep is recorded with normal appearing structures.   Photic stimulation: Physiologic driving is present  EEG Abnormalities: None  Clinical Interpretation: This normal EEG is recorded in the waking and sleep state. There was no seizure or seizure predisposition recorded on this study. Please note that a normal EEG does not preclude the possibility of epilepsy.   Roland Rack, MD [facility] [phone]  If 7pm- 7am, please page neurology on call as listed in [place].

## 2018-03-19 NOTE — Care Management Note (Signed)
Case Management Note  Patient Details  Name: [name] MRN: [id number] Date of Birth: October 30, 1926  Subjective/Objective:   Patient to be discharged per MD order. Orders in place for home health services. Patient previously set up with home health via [facility] and per patient preference she would like to continue these services. Referral for resumption placed with Joelene Millin from [facility] who agrees to resume Therapist, sports, aide and new PT services. Family to provide transport. No DME needs.  Merrily Pew Zackari Ruane RN BSN RNCM [phone]                   Action/Plan:   Expected Discharge Date:  03/19/18               Expected Discharge Plan:  [place]  In-House Referral:     Discharge planning Services  CM Consult  Post Acute Care Choice:  Resumption of Svcs/PTA Provider, Home Health Choice offered to:  Patient  DME Arranged:    DME Agency:     HH Arranged:  RN, PT, Nurse's Aide [place] Agency:  [facility]  Status of Service:  Completed, signed off  If discussed at [place] of Stay Meetings, dates discussed:    Additional Comments:  Latanya Maudlin, RN 03/19/2018, 9:39 AM

## 2018-03-19 NOTE — Discharge Summary (Signed)
[facility] [place] NAME: [name]    MR#:  [id number]  DATE OF BIRTH:  May 17, 1927  DATE OF ADMISSION:  03/17/2018   ADMITTING PHYSICIAN: Vaughan Basta, MD  DATE OF DISCHARGE:  03/19/2018 PRIMARY CARE PHYSICIAN: Lavera Guise, MD   ADMISSION DIAGNOSIS:  Confusion [R41.0] Anemia, unspecified type [D64.9] DISCHARGE DIAGNOSIS:  Principal Problem:   Loss of consciousness ([place])  SECONDARY DIAGNOSIS:   Past Medical History:  Diagnosis Date  . Allergy   . Arthritis   . CKD (chronic kidney disease), stage III ([place])   . GERD (gastroesophageal reflux disease)   . Hyperlipidemia   . Hypertension   . Insomnia   . Stroke [facility])    HOSPITAL COURSE:  84 year old female with known past medical history significant for arthritis, CKD stage III, hypertension, hyperlipidemia and stroke presents to hospital secondary to a near syncopal episode  1.  Staring spells/near syncopal episode-initially thought to be vasovagal as happened on the commode while straining -MRI of the brain negative for acute infarcts.  Carotid Dopplers however showing critical left ICA stenosis -Echocardiogram with EF of 55% -Appreciate neurology consult -PT recommended home health. -EEG is normal. -Recent on Plavix and a statin.  2.  Left ICA stenosis-known history of left ICA stenosis.  Followed by vascular every 6 months. -Ultrasound showing worsening plaque buildup, CT angiogram ordered   Patient is on Plavix and Statin. Hold Plavix for several days for CEA as outpatient per Dr. Lucky Cowboy.  3.  Hypertension-continue home medications.  Patient on clonidine patch, Norvasc, benazepril  4.  Acute cystitis-urine cultures are pending.  Started on Rocephin, to Keflex p.o.  5.  DVT prophylaxis-subcutaneous heparin  6.  Anemia-anemia of chronic disease.  Hemoglobin is stable at 9.7.   Continue iron supplements orally.  7.  Elevated troponin-demand ischemia.   Echo with no wall motion abnormalities.  Monitor  CKD stage III.  Stable. Patient is ambulatory at baseline with a walker.  Physical therapy consulted and they have recommended home health.  I discussed with Dr. Lucky Cowboy and Dr. Doy Mince. DISCHARGE CONDITIONS:  Stable, discharge to home with home health and PT today. CONSULTS OBTAINED:  Treatment Team:  Leotis Pain, MD Algernon Huxley, MD DRUG ALLERGIES:   Allergies  Allergen Reactions  . Sulfa Antibiotics    DISCHARGE MEDICATIONS:   Allergies as of 03/19/2018      Reactions   Sulfa Antibiotics       Medication List    STOP taking these medications   clopidogrel 75 MG tablet Commonly known as:  PLAVIX     TAKE these medications   allopurinol 100 MG tablet Commonly known as:  ZYLOPRIM take 2 tablets by mouth at bedtime for gout   ALPRAZolam 0.25 MG tablet Commonly known as:  XANAX take 1 tablet by mouth at bedtime if needed for anxiety What changed:  See the new instructions.   amLODipine 2.5 MG tablet Commonly known as:  NORVASC take 1 tablet by mouth at bedtime What changed:    how much to take  how to take this  when to take this   benazepril 5 MG tablet Commonly known as:  LOTENSIN take 1 tablet by mouth twice a day   CENTRUM SILVER 50+WOMEN Tabs Take 1 tablet by mouth daily.   cephALEXin 500 MG capsule Commonly known as:  KEFLEX Take 1 capsule (500 mg total) by mouth 3 (three) times daily for 6 days.  cloNIDine 0.2 mg/24hr patch Commonly known as:  CATAPRES - Dosed in mg/24 hr apply 1 patch every week   FEROSUL 325 (65 FE) MG tablet Generic drug:  ferrous sulfate take 1 tablet by mouth twice a day   fluticasone 50 MCG/ACT nasal spray Commonly known as:  FLONASE Place 1 spray into both nostrils daily as needed for allergies.   omeprazole 20 MG capsule Commonly known as:  PRILOSEC take 1 capsule by mouth once daily FOR BELCHING AND STOMACH.   pravastatin 20 MG tablet Commonly known as:   PRAVACHOL take 1 tablet by mouth once daily   PREMARIN vaginal cream Generic drug:  conjugated estrogens Place 1 Applicatorful vaginally once a week. At night.   triamcinolone 0.025 % cream Commonly known as:  KENALOG Apply 1 application topically 2 (two) times daily.   Vitamin D3 2000 units capsule Take 2,000 Units by mouth daily.   VOLTAREN 1 % Gel Generic drug:  diclofenac sodium apply 2 grams to affected area twice a day        DISCHARGE INSTRUCTIONS:  See AVS.  If you experience worsening of your admission symptoms, develop shortness of breath, life threatening emergency, suicidal or homicidal thoughts you must seek medical attention immediately by calling 911 or calling your MD immediately  if symptoms less severe.  You Must read complete instructions/literature along with all the possible adverse reactions/side effects for all the Medicines you take and that have been prescribed to you. Take any new Medicines after you have completely understood and accpet all the possible adverse reactions/side effects.   Please note  You were cared for by a hospitalist during your hospital stay. If you have any questions about your discharge medications or the care you received while you were in the hospital after you are discharged, you can call the unit and asked to speak with the hospitalist on call if the hospitalist that took care of you is not available. Once you are discharged, your primary care physician will handle any further medical issues. Please note that NO REFILLS for any discharge medications will be authorized once you are discharged, as it is imperative that you return to your primary care physician (or establish a relationship with a primary care physician if you do not have one) for your aftercare needs so that they can reassess your need for medications and monitor your lab values.    On the day of Discharge:  VITAL SIGNS:  Blood pressure (!) 144/55, pulse 67,  temperature 98 F (36.7 C), temperature source Oral, resp. rate 16, height 5\' 2"  (1.575 m), weight 161 lb (73 kg), SpO2 97 %. PHYSICAL EXAMINATION:  GENERAL:  84 y.o.-year-old patient lying in the bed with no acute distress.  EYES: Pupils equal, round, reactive to light and accommodation. No scleral icterus. Extraocular muscles intact.  HEENT: Head atraumatic, normocephalic. Oropharynx and nasopharynx clear.  NECK:  Supple, no jugular venous distention. No thyroid enlargement, no tenderness.  LUNGS: Normal breath sounds bilaterally, no wheezing, rales,rhonchi or crepitation. No use of accessory muscles of respiration.  CARDIOVASCULAR: S1, S2 normal. No murmurs, rubs, or gallops.  ABDOMEN: Soft, non-tender, non-distended. Bowel sounds present. No organomegaly or mass.  EXTREMITIES: No pedal edema, cyanosis, or clubbing.  NEUROLOGIC: Cranial nerves II through XII are intact. Muscle strength 4/5 in all extremities. Sensation intact. Gait not checked.  PSYCHIATRIC: The patient is alert and oriented x 3.  SKIN: No obvious rash, lesion, or ulcer.  DATA REVIEW:   CBC Recent  Labs  Lab 03/18/18 0259  WBC 8.7  HGB 9.7*  HCT 28.3*  PLT 208    Chemistries  Recent Labs  Lab 03/17/18 1359  03/19/18 0458  NA 140   < > 142  K 4.6   < > 4.5  CL 108   < > 109  CO2 25   < > 27  GLUCOSE 158*   < > 96  BUN 37*   < > 37*  CREATININE 1.45*   < > 1.37*  CALCIUM 9.0   < > 9.1  AST 24  --   --   ALT 12  --   --   ALKPHOS 64  --   --   BILITOT 0.5  --   --    < > = values in this interval not displayed.     Microbiology Results  Results for orders placed or performed during the hospital encounter of 03/17/18  Urine Culture     Status: Abnormal (Preliminary result)   Collection Time: 03/18/18  2:30 PM  Result Value Ref Range Status   Specimen Description   Final    URINE, RANDOM Performed at [facility], [address], [place], [place] [address]    Special Requests   Final     NONE Performed at [facility], [address] [place], [place] [address]    Culture >=100,000 COLONIES/mL ESCHERICHIA COLI (A)  Final   Report Status PENDING  Incomplete    RADIOLOGY:  Ct Angio Neck W Or Wo Contrast  Result Date: 03/18/2018 CLINICAL DATA:  Carotid stenosis. EXAM: CT ANGIOGRAPHY NECK TECHNIQUE: Multidetector CT imaging of the neck was performed using the standard protocol during bolus administration of intravenous contrast. Multiplanar CT image reconstructions and MIPs were obtained to evaluate the vascular anatomy. Carotid stenosis measurements (when applicable) are obtained utilizing NASCET criteria, using the distal internal carotid diameter as the denominator. CONTRAST:  1mL ISOVUE-370 IOPAMIDOL (ISOVUE-370) INJECTION 76% COMPARISON:  CT of the neck 04/06/2014. FINDINGS: Aortic arch: Atherosclerotic calcifications are present at the aortic arch. A 3 vessel arch configuration is present. There is no focal stenosis or aneurysm. Right carotid system: The right common carotid artery is within normal limits. Atherosclerotic irregularity is present at the right carotid bifurcation without significant stenosis relative to the more distal vessel. Moderate right-sided tortuosity is again seen without significant stenosis. Left carotid system: The left common carotid artery is within normal limits. Progressive atherosclerotic disease present with a high-grade, near complete stenosis of the proximal left internal carotid artery. The cervical left ICA measures 4.3 mm at the level of C2 compared to 5.0 mm on the right. Vertebral arteries: Left vertebral artery is dominant to the right. Both vertebral arteries originate from the subclavian arteries without significant stenosis. There is no significant stenosis of either vertebral artery in the neck. No vascular injury is evident. Skeleton: Multilevel endplate degenerative changes are again noted in the cervical spine. Grade 1  anterolisthesis at C3-4 is stable. AP alignment is otherwise anatomic. Vertebral body heights are maintained. Other neck: The soft tissues of the neck are otherwise unremarkable. A 5 mm right thyroid nodule is present. Upper chest: Lung apices are clear. IMPRESSION: 1. Progressive high-grade, near occlusive, stenosis of the left common carotid artery with asymmetric decreased caliber of the more distal left internal carotid artery indicating the extent of proximal stenosis. 2. Atherosclerotic changes of the right carotid bifurcation without significant stenosis. 3. Stable appearance of multilevel degenerative changes of the  cervical spine with grade 1 anterolisthesis at C3-4. Electronically Signed   By: San Morelle M.D.   On: 03/18/2018 16:20     Management plans discussed with the patient, family and they are in agreement.  CODE STATUS: DNR   TOTAL TIME TAKING CARE OF THIS PATIENT: 33 minutes.    Demetrios Loll M.D on 03/19/2018 at 3:41 PM  Between 7am to 6pm - Pager - [phone]  After 6pm go to [email or web address] - password [email or web address] [facility]  [facility]  Office  [phone]  CC: Primary care physician; Lavera Guise, MD   Note: This dictation was prepared with Dragon dictation along with smaller phrase technology. Any transcriptional errors that result from this process are unintentional.

## 2018-03-19 NOTE — Care Management Obs Status (Signed)
[place] NOTIFICATION   Patient Details  Name: [name] MRN: [id number] Date of Birth: [birth year]   Medicare Observation Status Notification Given:  Yes    Zidan Helget A Dashanna Kinnamon, RN 03/19/2018, 8:55 AM

## 2018-03-19 NOTE — Progress Notes (Signed)
Patient discharged per MD order. All discharge instructions given and all questions answered.

## 2018-03-19 NOTE — Progress Notes (Signed)
eeg completed ° °

## 2018-03-19 NOTE — Discharge Instructions (Signed)
HHPT °

## 2018-03-19 NOTE — Progress Notes (Signed)
[facility]  Daily Progress Note   Subjective  - * No surgery found *  No events overnight.  For EEG today.  No new complaints  Objective Vitals:   03/18/18 0429 03/18/18 1042 03/18/18 2018 03/19/18 0521  BP: 140/64 (!) 170/72 (!) 185/63 (!) 144/55  Pulse: 69  89 67  Resp: 16  20 16   Temp: 98.2 F (36.8 C)  98.1 F (36.7 C) 98 F (36.7 C)  TempSrc: Oral  Oral Oral  SpO2: 97%  96% 97%  Weight:      Height:        Intake/Output Summary (Last 24 hours) at 03/19/2018 1300 Last data filed at 03/18/2018 2016 Gross per 24 hour  Intake 290 ml  Output -  Net 290 ml    PULM  CTAB CV  RRR   Laboratory CBC    Component Value Date/Time   WBC 8.7 03/18/2018 0259   HGB 9.7 (L) 03/18/2018 0259   HGB 10.1 (L) 11/21/2017 1132   HCT 28.3 (L) 03/18/2018 0259   HCT 31.5 (L) 11/21/2017 1132   PLT 208 03/18/2018 0259   PLT 339 11/21/2017 1132    BMET    Component Value Date/Time   NA 142 03/19/2018 0458   NA 138 11/21/2017 1132   NA 142 04/07/2014 0409   K 4.5 03/19/2018 0458   K 4.0 04/07/2014 0409   CL 109 03/19/2018 0458   CL 107 04/07/2014 0409   CO2 27 03/19/2018 0458   CO2 26 04/07/2014 0409   GLUCOSE 96 03/19/2018 0458   GLUCOSE 87 04/07/2014 0409   BUN 37 (H) 03/19/2018 0458   BUN 35 (H) 11/21/2017 1132   BUN 26 (H) 04/07/2014 0409   CREATININE 1.37 (H) 03/19/2018 0458   CREATININE 1.43 (H) 04/07/2014 0409   CALCIUM 9.1 03/19/2018 0458   CALCIUM 8.8 04/07/2014 0409   GFRNONAA 33 (L) 03/19/2018 0458   GFRNONAA 33 (L) 04/07/2014 0409   GFRAA 38 (L) 03/19/2018 0458   GFRAA 39 (L) 04/07/2014 0409    Assessment/Planning:    High-grade left carotid artery stenosis with syncope  Patient is decided to have a left carotid endarterectomy  Had Plavix on Monday.  Would prefer to hold this at least 5 days prior to surgery.  We will plan to do either Monday or Wednesday of next week as an outpatient.  She will stay in the hospital 1 to 2 days  postoperatively at least given her age.  Risks and benefits were discussed with the patient and I also contacted her daughter today as well.      Leotis Pain  03/19/2018, 1:00 PM

## 2018-03-20 LAB — URINE CULTURE: Culture: >=100000 — AB

## 2018-03-22 ENCOUNTER — Other Ambulatory Visit (INDEPENDENT_AMBULATORY_CARE_PROVIDER_SITE_OTHER): Payer: Self-pay

## 2018-03-22 ENCOUNTER — Telehealth: Payer: Self-pay

## 2018-03-22 DIAGNOSIS — N183 Chronic kidney disease, stage 3 (moderate): Secondary | ICD-10-CM | POA: Diagnosis not present

## 2018-03-22 DIAGNOSIS — I739 Peripheral vascular disease, unspecified: Secondary | ICD-10-CM | POA: Diagnosis not present

## 2018-03-22 DIAGNOSIS — Z7902 Long term (current) use of antithrombotics/antiplatelets: Secondary | ICD-10-CM | POA: Diagnosis not present

## 2018-03-22 DIAGNOSIS — Z87440 Personal history of urinary (tract) infections: Secondary | ICD-10-CM | POA: Diagnosis not present

## 2018-03-22 DIAGNOSIS — Z7951 Long term (current) use of inhaled steroids: Secondary | ICD-10-CM | POA: Diagnosis not present

## 2018-03-22 DIAGNOSIS — I129 Hypertensive chronic kidney disease with stage 1 through stage 4 chronic kidney disease, or unspecified chronic kidney disease: Secondary | ICD-10-CM | POA: Diagnosis not present

## 2018-03-22 DIAGNOSIS — M19012 Primary osteoarthritis, left shoulder: Secondary | ICD-10-CM | POA: Diagnosis not present

## 2018-03-22 DIAGNOSIS — E1122 Type 2 diabetes mellitus with diabetic chronic kidney disease: Secondary | ICD-10-CM | POA: Diagnosis not present

## 2018-03-22 DIAGNOSIS — D519 Vitamin B12 deficiency anemia, unspecified: Secondary | ICD-10-CM | POA: Diagnosis not present

## 2018-03-22 NOTE — Patient Instructions (Signed)
Your procedure is scheduled on: Monday, March 25, 2018 Report to Day Surgery on the 2nd floor of the [facility]. To find out your arrival time, please call [phone] between 1PM - 3PM on:  REMEMBER: Instructions that are not followed completely may result in serious medical risk, up to and including death; or upon the discretion of your surgeon and anesthesiologist your surgery may need to be rescheduled.  Do not eat food after midnight the night before surgery.  No gum chewing, lozengers or hard candies.  You may however, drink CLEAR liquids up to 2 hours before you are scheduled to arrive for your surgery. Do not drink anything within 2 hours of the start of your surgery.  Clear liquids include: - water  - apple juice without pulp - gatorade - black coffee or tea (Do NOT add milk or creamers to the coffee or tea) Do NOT drink anything that is not on this list.  No Alcohol for 24 hours before or after surgery.  No Smoking including e-cigarettes for 24 hours prior to surgery.  No chewable tobacco products for at least 6 hours prior to surgery.  No nicotine patches on the day of surgery.  On the morning of surgery brush your teeth with toothpaste and water, you may rinse your mouth with mouthwash if you wish. Do not swallow any toothpaste or mouthwash.  Notify your doctor if there is any change in your medical condition (cold, fever, infection).  Do not wear jewelry, make-up, hairpins, clips or nail polish.  Do not wear lotions, powders, or perfumes. You may wear deodorant.  Do not shave 48 hours prior to surgery.   Contacts and dentures may not be worn into surgery.  Do not bring valuables to the hospital, including drivers license, insurance or credit cards.  [facility] is not responsible for any belongings or valuables.   TAKE THESE MEDICATIONS THE MORNING OF SURGERY:  1.  Omeprazole (take one the night before surgery and one the morning of surgery)  Follow  recommendations from Cardiologist, Pulmonologist or PCP regarding stopping Plavix.  NOW!  Stop Anti-inflammatories (NSAIDS) such as Advil, Aleve, Ibuprofen, Motrin, Naproxen, Naprosyn and Aspirin based products such as Excedrin, Goodys Powder, BC Powder. (May take Tylenol or Acetaminophen if needed.)  NOW!  Stop ANY OVER THE COUNTER supplements until after surgery. (May continue Vitamin D, Vitamin B, and multivitamin.)  Wear comfortable clothing (specific to your surgery type) to the hospital.  Plan for stool softeners for home use.  If you are being admitted to the hospital overnight, leave your suitcase in the car. After surgery it may be brought to your room.  Please call [phone] if you have any questions about these instructions.

## 2018-03-22 NOTE — Telephone Encounter (Signed)
PT MISSED HER HOME HEALTH AIDE VISIT.

## 2018-03-24 MED ORDER — CEFAZOLIN SODIUM-DEXTROSE 1-4 GM/50ML-% IV SOLN
1.0000 g | Freq: Once | INTRAVENOUS | Status: AC
  Administered 2018-03-25: 1 g via INTRAVENOUS

## 2018-03-25 ENCOUNTER — Inpatient Hospital Stay: Payer: Medicare HMO | Admitting: Certified Registered"

## 2018-03-25 ENCOUNTER — Encounter
Admission: RE | Disposition: A | Discharge status: Home / Self Care | Payer: Self-pay | Source: Ambulatory Visit | Attending: Vascular Surgery

## 2018-03-25 ENCOUNTER — Inpatient Hospital Stay
Admission: RE | Admit: 2018-03-25 | Discharge: 2018-03-29 | DRG: 039 | Disposition: A | Discharge status: Home / Self Care | Payer: Medicare HMO | Source: Ambulatory Visit | Attending: Vascular Surgery | Admitting: Vascular Surgery

## 2018-03-25 ENCOUNTER — Other Ambulatory Visit: Payer: Self-pay

## 2018-03-25 DIAGNOSIS — N183 Chronic kidney disease, stage 3 (moderate): Secondary | ICD-10-CM | POA: Diagnosis not present

## 2018-03-25 DIAGNOSIS — I6523 Occlusion and stenosis of bilateral carotid arteries: Secondary | ICD-10-CM | POA: Diagnosis not present

## 2018-03-25 DIAGNOSIS — R0902 Hypoxemia: Secondary | ICD-10-CM | POA: Diagnosis not present

## 2018-03-25 DIAGNOSIS — Z8673 Personal history of transient ischemic attack (TIA), and cerebral infarction without residual deficits: Secondary | ICD-10-CM

## 2018-03-25 DIAGNOSIS — I6522 Occlusion and stenosis of left carotid artery: Principal | ICD-10-CM | POA: Diagnosis present

## 2018-03-25 DIAGNOSIS — E785 Hyperlipidemia, unspecified: Secondary | ICD-10-CM | POA: Diagnosis present

## 2018-03-25 DIAGNOSIS — E8779 Other fluid overload: Secondary | ICD-10-CM | POA: Diagnosis not present

## 2018-03-25 DIAGNOSIS — E1151 Type 2 diabetes mellitus with diabetic peripheral angiopathy without gangrene: Secondary | ICD-10-CM | POA: Diagnosis not present

## 2018-03-25 DIAGNOSIS — Z79899 Other long term (current) drug therapy: Secondary | ICD-10-CM | POA: Diagnosis not present

## 2018-03-25 DIAGNOSIS — E039 Hypothyroidism, unspecified: Secondary | ICD-10-CM | POA: Diagnosis not present

## 2018-03-25 DIAGNOSIS — I129 Hypertensive chronic kidney disease with stage 1 through stage 4 chronic kidney disease, or unspecified chronic kidney disease: Secondary | ICD-10-CM | POA: Diagnosis present

## 2018-03-25 DIAGNOSIS — Z882 Allergy status to sulfonamides status: Secondary | ICD-10-CM | POA: Diagnosis not present

## 2018-03-25 DIAGNOSIS — E1122 Type 2 diabetes mellitus with diabetic chronic kidney disease: Secondary | ICD-10-CM | POA: Diagnosis not present

## 2018-03-25 DIAGNOSIS — K219 Gastro-esophageal reflux disease without esophagitis: Secondary | ICD-10-CM | POA: Diagnosis not present

## 2018-03-25 HISTORY — PX: ENDARTERECTOMY: SHX5162

## 2018-03-25 LAB — CBC
HEMATOCRIT: 31 % — AB (ref 35.0–47.0)
HEMOGLOBIN: 10.5 g/dL — AB (ref 12.0–16.0)
MCH: 33.6 pg (ref 26.0–34.0)
MCHC: 33.8 g/dL (ref 32.0–36.0)
MCV: 99.6 fL (ref 80.0–100.0)
Platelets: 216 10*3/uL (ref 150–440)
RBC: 3.11 MIL/uL — AB (ref 3.80–5.20)
RDW: 15.6 % — ABNORMAL HIGH (ref 11.5–14.5)
WBC: 11.9 10*3/uL — ABNORMAL HIGH (ref 3.6–11.0)

## 2018-03-25 LAB — TYPE AND SCREEN
ABO/RH(D): O POS
ANTIBODY SCREEN: NEGATIVE

## 2018-03-25 LAB — APTT: aPTT: 40 seconds — ABNORMAL HIGH (ref 24–36)

## 2018-03-25 LAB — PROTIME-INR
INR: 0.94
Prothrombin Time: 12.5 seconds (ref 11.4–15.2)

## 2018-03-25 LAB — SURGICAL PCR SCREEN
MRSA, PCR: NEGATIVE
Staphylococcus aureus: POSITIVE — AB

## 2018-03-25 LAB — GLUCOSE, CAPILLARY: Glucose-Capillary: 110 mg/dL — ABNORMAL HIGH (ref 70–99)

## 2018-03-25 SURGERY — ENDARTERECTOMY, CAROTID
Anesthesia: General | Laterality: Left

## 2018-03-25 MED ORDER — PROMETHAZINE HCL 25 MG/ML IJ SOLN: 6.2500 mg | INTRAMUSCULAR | Status: DC | PRN

## 2018-03-25 MED ORDER — HEPARIN SODIUM (PORCINE) 1000 UNIT/ML IJ SOLN
INTRAMUSCULAR | Status: DC | PRN
  Administered 2018-03-25: 6000 [IU] via INTRAVENOUS

## 2018-03-25 MED ORDER — ACETAMINOPHEN 650 MG RE SUPP: 325.0000 mg | RECTAL | Status: DC | PRN

## 2018-03-25 MED ORDER — PHENYLEPHRINE HCL 10 MG/ML IJ SOLN
INTRAMUSCULAR | Status: DC | PRN
  Administered 2018-03-25 (×5): 100 ug via INTRAVENOUS

## 2018-03-25 MED ORDER — ACETAMINOPHEN 325 MG PO TABS: 325.0000 mg | ORAL_TABLET | ORAL | Status: DC | PRN

## 2018-03-25 MED ORDER — HYDRALAZINE HCL 20 MG/ML IJ SOLN: 5.0000 mg | INTRAMUSCULAR | Status: DC | PRN

## 2018-03-25 MED ORDER — EVICEL 2 ML EX KIT
PACK | CUTANEOUS | Status: AC
  Filled 2018-03-25: qty 1

## 2018-03-25 MED ORDER — ROCURONIUM BROMIDE 50 MG/5ML IV SOLN
INTRAVENOUS | Status: AC
  Filled 2018-03-25: qty 1

## 2018-03-25 MED ORDER — FAMOTIDINE IN NACL 20-0.9 MG/50ML-% IV SOLN
20.0000 mg | Freq: Two times a day (BID) | INTRAVENOUS | Status: DC
  Administered 2018-03-25 – 2018-03-27 (×4): 20 mg via INTRAVENOUS
  Filled 2018-03-25 (×4): qty 50

## 2018-03-25 MED ORDER — LABETALOL HCL 5 MG/ML IV SOLN
INTRAVENOUS | Status: DC | PRN
  Administered 2018-03-25: 5 mg via INTRAVENOUS

## 2018-03-25 MED ORDER — CLONIDINE HCL 0.1 MG/24HR TD PTWK
0.1000 mg | MEDICATED_PATCH | TRANSDERMAL | Status: DC
  Administered 2018-03-25: 0.1 mg via TRANSDERMAL
  Filled 2018-03-25: qty 1

## 2018-03-25 MED ORDER — EPHEDRINE SULFATE 50 MG/ML IJ SOLN
INTRAMUSCULAR | Status: DC | PRN
  Administered 2018-03-25 (×2): 5 mg via INTRAVENOUS

## 2018-03-25 MED ORDER — POTASSIUM CHLORIDE CRYS ER 20 MEQ PO TBCR: 20.0000 meq | EXTENDED_RELEASE_TABLET | Freq: Every day | ORAL | Status: DC | PRN

## 2018-03-25 MED ORDER — PROPOFOL 10 MG/ML IV BOLUS
INTRAVENOUS | Status: AC
  Filled 2018-03-25: qty 20

## 2018-03-25 MED ORDER — FERROUS SULFATE 325 (65 FE) MG PO TABS
325.0000 mg | ORAL_TABLET | Freq: Two times a day (BID) | ORAL | Status: DC
  Administered 2018-03-25 – 2018-03-29 (×7): 325 mg via ORAL
  Filled 2018-03-25 (×8): qty 1

## 2018-03-25 MED ORDER — ASPIRIN EC 81 MG PO TBEC
81.0000 mg | DELAYED_RELEASE_TABLET | Freq: Every day | ORAL | Status: DC
  Administered 2018-03-25 – 2018-03-29 (×5): 81 mg via ORAL
  Filled 2018-03-25 (×5): qty 1

## 2018-03-25 MED ORDER — FLUTICASONE PROPIONATE 50 MCG/ACT NA SUSP
1.0000 | Freq: Every day | NASAL | Status: DC | PRN
  Filled 2018-03-25: qty 16

## 2018-03-25 MED ORDER — LIDOCAINE HCL (CARDIAC) PF 100 MG/5ML IV SOSY
PREFILLED_SYRINGE | INTRAVENOUS | Status: DC | PRN
  Administered 2018-03-25: 100 mg via INTRAVENOUS

## 2018-03-25 MED ORDER — CEFAZOLIN SODIUM-DEXTROSE 1-4 GM/50ML-% IV SOLN
INTRAVENOUS | Status: AC
  Filled 2018-03-25: qty 50

## 2018-03-25 MED ORDER — OXYCODONE HCL 5 MG PO TABS: 5.0000 mg | ORAL_TABLET | Freq: Once | ORAL | Status: DC | PRN

## 2018-03-25 MED ORDER — MEPERIDINE HCL 50 MG/ML IJ SOLN: 6.2500 mg | INTRAMUSCULAR | Status: DC | PRN

## 2018-03-25 MED ORDER — BENAZEPRIL HCL 5 MG PO TABS
5.0000 mg | ORAL_TABLET | Freq: Two times a day (BID) | ORAL | Status: DC
  Administered 2018-03-26 – 2018-03-29 (×7): 5 mg via ORAL
  Filled 2018-03-25 (×10): qty 1

## 2018-03-25 MED ORDER — CEFAZOLIN SODIUM-DEXTROSE 1-4 GM/50ML-% IV SOLN
1.0000 g | Freq: Two times a day (BID) | INTRAVENOUS | Status: AC
  Administered 2018-03-26 (×2): 1 g via INTRAVENOUS
  Filled 2018-03-25 (×2): qty 50

## 2018-03-25 MED ORDER — FENTANYL CITRATE (PF) 100 MCG/2ML IJ SOLN: 25.0000 ug | INTRAMUSCULAR | Status: DC | PRN

## 2018-03-25 MED ORDER — FENTANYL CITRATE (PF) 100 MCG/2ML IJ SOLN
INTRAMUSCULAR | Status: DC | PRN
  Administered 2018-03-25: 100 ug via INTRAVENOUS

## 2018-03-25 MED ORDER — ADULT MULTIVITAMIN W/MINERALS CH
1.0000 | ORAL_TABLET | Freq: Every day | ORAL | Status: DC
  Administered 2018-03-25 – 2018-03-29 (×5): 1 via ORAL
  Filled 2018-03-25 (×5): qty 1

## 2018-03-25 MED ORDER — SODIUM CHLORIDE 0.9 % IV SOLN
INTRAVENOUS | Status: DC
  Administered 2018-03-25: 11:00:00 via INTRAVENOUS

## 2018-03-25 MED ORDER — ROCURONIUM BROMIDE 100 MG/10ML IV SOLN
INTRAVENOUS | Status: DC | PRN
  Administered 2018-03-25: 10 mg via INTRAVENOUS
  Administered 2018-03-25: 50 mg via INTRAVENOUS

## 2018-03-25 MED ORDER — SODIUM CHLORIDE 0.9 % IV SOLN: 500.0000 mL | Freq: Once | INTRAVENOUS | Status: DC | PRN

## 2018-03-25 MED ORDER — LABETALOL HCL 5 MG/ML IV SOLN
10.0000 mg | INTRAVENOUS | Status: DC | PRN
  Administered 2018-03-25 (×2): 10 mg via INTRAVENOUS
  Filled 2018-03-25 (×3): qty 4

## 2018-03-25 MED ORDER — CLOPIDOGREL BISULFATE 75 MG PO TABS
75.0000 mg | ORAL_TABLET | Freq: Every day | ORAL | Status: DC
  Administered 2018-03-26 – 2018-03-29 (×4): 75 mg via ORAL
  Filled 2018-03-25 (×4): qty 1

## 2018-03-25 MED ORDER — MORPHINE SULFATE (PF) 2 MG/ML IV SOLN: 2.0000 mg | INTRAVENOUS | Status: DC | PRN

## 2018-03-25 MED ORDER — NITROGLYCERIN IN D5W 200-5 MCG/ML-% IV SOLN: 5.0000 ug/min | INTRAVENOUS | Status: DC

## 2018-03-25 MED ORDER — ONDANSETRON HCL 4 MG/2ML IJ SOLN: 4.0000 mg | Freq: Four times a day (QID) | INTRAMUSCULAR | Status: DC | PRN

## 2018-03-25 MED ORDER — GUAIFENESIN-DM 100-10 MG/5ML PO SYRP
15.0000 mL | ORAL_SOLUTION | ORAL | Status: DC | PRN
  Filled 2018-03-25: qty 15

## 2018-03-25 MED ORDER — ALPRAZOLAM 0.25 MG PO TABS
0.2500 mg | ORAL_TABLET | Freq: Every day | ORAL | Status: DC
  Administered 2018-03-25 – 2018-03-28 (×4): 0.25 mg via ORAL
  Filled 2018-03-25 (×4): qty 1

## 2018-03-25 MED ORDER — FENTANYL CITRATE (PF) 100 MCG/2ML IJ SOLN
INTRAMUSCULAR | Status: AC
  Filled 2018-03-25: qty 2

## 2018-03-25 MED ORDER — PRAVASTATIN SODIUM 20 MG PO TABS
20.0000 mg | ORAL_TABLET | Freq: Every day | ORAL | Status: DC
  Administered 2018-03-25 – 2018-03-29 (×5): 20 mg via ORAL
  Filled 2018-03-25 (×5): qty 1

## 2018-03-25 MED ORDER — VITAMIN D3 25 MCG (1000 UNIT) PO TABS
2000.0000 [IU] | ORAL_TABLET | Freq: Every day | ORAL | Status: DC
  Administered 2018-03-25 – 2018-03-29 (×5): 2000 [IU] via ORAL
  Filled 2018-03-25 (×11): qty 2

## 2018-03-25 MED ORDER — ONDANSETRON HCL 4 MG/2ML IJ SOLN
INTRAMUSCULAR | Status: DC | PRN
  Administered 2018-03-25: 4 mg via INTRAVENOUS

## 2018-03-25 MED ORDER — ALUM & MAG HYDROXIDE-SIMETH 200-200-20 MG/5ML PO SUSP
15.0000 mL | ORAL | Status: DC | PRN
  Filled 2018-03-25: qty 30

## 2018-03-25 MED ORDER — HEPARIN SODIUM (PORCINE) 10000 UNIT/ML IJ SOLN
INTRAMUSCULAR | Status: AC
  Filled 2018-03-25: qty 1

## 2018-03-25 MED ORDER — LABETALOL HCL 5 MG/ML IV SOLN
INTRAVENOUS | Status: AC
  Filled 2018-03-25: qty 4

## 2018-03-25 MED ORDER — PHENOL 1.4 % MT LIQD
1.0000 | OROMUCOSAL | Status: DC | PRN
  Filled 2018-03-25: qty 177

## 2018-03-25 MED ORDER — MAGNESIUM SULFATE 2 GM/50ML IV SOLN: 2.0000 g | Freq: Every day | INTRAVENOUS | Status: DC | PRN

## 2018-03-25 MED ORDER — SODIUM CHLORIDE 0.9 % IV SOLN
INTRAVENOUS | Status: DC
  Administered 2018-03-25 – 2018-03-26 (×2): via INTRAVENOUS

## 2018-03-25 MED ORDER — NITROGLYCERIN IN D5W 200-5 MCG/ML-% IV SOLN
INTRAVENOUS | Status: AC
  Filled 2018-03-25: qty 250

## 2018-03-25 MED ORDER — DOCUSATE SODIUM 100 MG PO CAPS
100.0000 mg | ORAL_CAPSULE | Freq: Every day | ORAL | Status: DC
  Administered 2018-03-26 – 2018-03-28 (×3): 100 mg via ORAL
  Filled 2018-03-25 (×3): qty 1

## 2018-03-25 MED ORDER — OXYCODONE-ACETAMINOPHEN 5-325 MG PO TABS
1.0000 | ORAL_TABLET | ORAL | Status: DC | PRN
  Administered 2018-03-25 – 2018-03-28 (×4): 2 via ORAL
  Filled 2018-03-25 (×4): qty 2

## 2018-03-25 MED ORDER — PANTOPRAZOLE SODIUM 40 MG PO TBEC
40.0000 mg | DELAYED_RELEASE_TABLET | Freq: Every day | ORAL | Status: DC
  Administered 2018-03-26 – 2018-03-29 (×4): 40 mg via ORAL
  Filled 2018-03-25 (×4): qty 1

## 2018-03-25 MED ORDER — EPHEDRINE SULFATE 50 MG/ML IJ SOLN
INTRAMUSCULAR | Status: AC
  Filled 2018-03-25: qty 1

## 2018-03-25 MED ORDER — PROPOFOL 10 MG/ML IV BOLUS
INTRAVENOUS | Status: DC | PRN
  Administered 2018-03-25: 120 mg via INTRAVENOUS

## 2018-03-25 MED ORDER — METOPROLOL TARTRATE 5 MG/5ML IV SOLN: 2.0000 mg | INTRAVENOUS | Status: DC | PRN

## 2018-03-25 MED ORDER — ONDANSETRON HCL 4 MG/2ML IJ SOLN
INTRAMUSCULAR | Status: AC
  Filled 2018-03-25: qty 2

## 2018-03-25 MED ORDER — SUCCINYLCHOLINE CHLORIDE 20 MG/ML IJ SOLN
INTRAMUSCULAR | Status: AC
  Filled 2018-03-25: qty 1

## 2018-03-25 MED ORDER — OXYCODONE HCL 5 MG/5ML PO SOLN: 5.0000 mg | Freq: Once | ORAL | Status: DC | PRN

## 2018-03-25 MED ORDER — SODIUM CHLORIDE 0.9 % IV SOLN
INTRAVENOUS | Status: DC | PRN
  Administered 2018-03-25: 20 ug/min via INTRAVENOUS

## 2018-03-25 MED ORDER — SUGAMMADEX SODIUM 200 MG/2ML IV SOLN
INTRAVENOUS | Status: AC
  Filled 2018-03-25: qty 2

## 2018-03-25 MED ORDER — LIDOCAINE HCL (PF) 1 % IJ SOLN
INTRAMUSCULAR | Status: AC
  Filled 2018-03-25: qty 30

## 2018-03-25 MED ORDER — AMLODIPINE BESYLATE 5 MG PO TABS
2.5000 mg | ORAL_TABLET | Freq: Every day | ORAL | Status: DC
Start: 2018-03-25 — End: 2018-03-29
  Administered 2018-03-26 – 2018-03-28 (×3): 2.5 mg via ORAL
  Filled 2018-03-25 (×3): qty 1

## 2018-03-25 MED ORDER — SODIUM CHLORIDE 0.9 % IV SOLN
INTRAVENOUS | Status: DC | PRN
  Administered 2018-03-25: 11:00:00 via INTRAVENOUS

## 2018-03-25 MED ORDER — LIDOCAINE HCL (PF) 2 % IJ SOLN
INTRAMUSCULAR | Status: AC
  Filled 2018-03-25: qty 10

## 2018-03-25 SURGICAL SUPPLY — 61 items
BAG DECANTER FOR FLEXI CONT (MISCELLANEOUS) ×3 IMPLANT
BLADE SURG 15 STRL LF DISP TIS (BLADE) ×1 IMPLANT
BLADE SURG 15 STRL SS (BLADE) ×2
BLADE SURG SZ11 CARB STEEL (BLADE) ×3 IMPLANT
BOOT SUTURE AID YELLOW STND (SUTURE) ×3 IMPLANT
BRUSH SCRUB EZ  4% CHG (MISCELLANEOUS) ×2
BRUSH SCRUB EZ 4% CHG (MISCELLANEOUS) ×1 IMPLANT
CANISTER SUCT 1200ML W/VALVE (MISCELLANEOUS) ×3 IMPLANT
DERMABOND ADVANCED (GAUZE/BANDAGES/DRESSINGS) ×2
DERMABOND ADVANCED .7 DNX12 (GAUZE/BANDAGES/DRESSINGS) ×1 IMPLANT
DRAPE INCISE IOBAN 66X45 STRL (DRAPES) ×3 IMPLANT
DRAPE LAPAROTOMY 77X122 PED (DRAPES) ×3 IMPLANT
DRAPE SHEET LG 3/4 BI-LAMINATE (DRAPES) ×3 IMPLANT
DRSG TEGADERM 4X4.75 (GAUZE/BANDAGES/DRESSINGS) IMPLANT
DRSG TELFA 3X8 NADH (GAUZE/BANDAGES/DRESSINGS) IMPLANT
DURAPREP 26ML APPLICATOR (WOUND CARE) ×3 IMPLANT
ELECT CAUTERY BLADE 6.4 (BLADE) ×3 IMPLANT
ELECT REM PT RETURN 9FT ADLT (ELECTROSURGICAL) ×3
ELECTRODE REM PT RTRN 9FT ADLT (ELECTROSURGICAL) ×1 IMPLANT
GLOVE BIO SURGEON STRL SZ7 (GLOVE) ×9 IMPLANT
GLOVE INDICATOR 7.5 STRL GRN (GLOVE) ×3 IMPLANT
GOWN STRL REUS W/ TWL LRG LVL3 (GOWN DISPOSABLE) ×2 IMPLANT
GOWN STRL REUS W/ TWL XL LVL3 (GOWN DISPOSABLE) ×2 IMPLANT
GOWN STRL REUS W/TWL LRG LVL3 (GOWN DISPOSABLE) ×4
GOWN STRL REUS W/TWL XL LVL3 (GOWN DISPOSABLE) ×4
HEMOSTAT SURGICEL 2X3 (HEMOSTASIS) ×3 IMPLANT
IV NS 250ML (IV SOLUTION) ×2
IV NS 250ML BAXH (IV SOLUTION) ×1 IMPLANT
KIT TURNOVER KIT A (KITS) ×3 IMPLANT
LABEL OR SOLS (LABEL) ×3 IMPLANT
LOOP RED MAXI  1X406MM (MISCELLANEOUS) ×4
LOOP VESSEL MAXI 1X406 RED (MISCELLANEOUS) ×2 IMPLANT
LOOP VESSEL MINI 0.8X406 BLUE (MISCELLANEOUS) ×1 IMPLANT
LOOPS BLUE MINI 0.8X406MM (MISCELLANEOUS) ×2
NEEDLE FILTER BLUNT 18X 1/2SAF (NEEDLE) ×2
NEEDLE FILTER BLUNT 18X1 1/2 (NEEDLE) ×1 IMPLANT
NEEDLE HYPO 25X1 1.5 SAFETY (NEEDLE) ×3 IMPLANT
NS IRRIG 1000ML POUR BTL (IV SOLUTION) ×3 IMPLANT
PACK BASIN MAJOR ARMC (MISCELLANEOUS) ×3 IMPLANT
PATCH CAROTID ECM VASC 1X10 (Prosthesis & Implant Heart) ×3 IMPLANT
PENCIL ELECTRO HAND CTR (MISCELLANEOUS) IMPLANT
SHUNT W TPORT 9FR PRUITT F3 (SHUNT) ×3 IMPLANT
SUT MNCRL 4-0 (SUTURE) ×2
SUT MNCRL 4-0 27XMFL (SUTURE) ×1
SUT PROLENE 6 0 BV (SUTURE) ×12 IMPLANT
SUT PROLENE 7 0 BV 1 (SUTURE) ×6 IMPLANT
SUT SILK 2 0 (SUTURE) ×2
SUT SILK 2-0 18XBRD TIE 12 (SUTURE) ×1 IMPLANT
SUT SILK 3 0 (SUTURE) ×2
SUT SILK 3-0 18XBRD TIE 12 (SUTURE) ×1 IMPLANT
SUT SILK 4 0 (SUTURE) ×2
SUT SILK 4-0 18XBRD TIE 12 (SUTURE) ×1 IMPLANT
SUT VIC AB 3-0 SH 27 (SUTURE) ×4
SUT VIC AB 3-0 SH 27X BRD (SUTURE) ×2 IMPLANT
SUTURE MNCRL 4-0 27XMF (SUTURE) ×1 IMPLANT
SYR 10ML LL (SYRINGE) ×6 IMPLANT
SYR 20CC LL (SYRINGE) ×3 IMPLANT
TOWEL OR 17X26 4PK STRL BLUE (TOWEL DISPOSABLE) ×3 IMPLANT
TRAY FOLEY MTR SLVR 16FR STAT (SET/KITS/TRAYS/PACK) ×3 IMPLANT
TUBING CONNECTING 10 (TUBING) IMPLANT
TUBING CONNECTING 10' (TUBING)

## 2018-03-25 NOTE — Anesthesia Procedure Notes (Signed)
Arterial Line Insertion Start/End8/07/2018 12:43 PM, 03/25/2018 12:48 PM Performed by: Lance Muss, CRNA, CRNA  Patient location: OR. Preanesthetic checklist: patient identified, IV checked, site marked, risks and benefits discussed, surgical consent, monitors and equipment checked, pre-op evaluation, timeout performed and anesthesia consent radial was placed Catheter size: 20 G Hand hygiene performed  Allen's test indicative of satisfactory collateral circulation Attempts: 1 Procedure performed without using ultrasound guided technique. Following insertion, Biopatch. Post procedure assessment: normal  Patient tolerated the procedure well with no immediate complications.

## 2018-03-25 NOTE — Care Management (Signed)
Notified [facility] of admission.  Patient receiving RN PT Aide services

## 2018-03-25 NOTE — Progress Notes (Signed)
Pt in questionable new afib, Dr Raphael Gibney aware and ekg ordered

## 2018-03-25 NOTE — Transfer of Care (Signed)
Immediate Anesthesia Transfer of Care Note  Patient: [name]  Procedure(s) Performed: ENDARTERECTOMY CAROTID (Left )  Patient Location: PACU  Anesthesia Type:General  Level of Consciousness: awake, alert  and responds to stimulation  Airway & Oxygen Therapy: Patient Spontanous Breathing and Patient connected to face mask oxygen  Post-op Assessment: Report given to RN and Post -op Vital signs reviewed and stable  Post vital signs: Reviewed and stable  Last Vitals:  Vitals Value Taken Time  BP 171/60 03/25/2018  3:03 PM  Temp    Pulse 82 03/25/2018  3:03 PM  Resp 10 03/25/2018  3:03 PM  SpO2 100 % 03/25/2018  3:03 PM  Vitals shown include unvalidated device data.  Last Pain:  Vitals:   03/25/18 1035  TempSrc: Oral  PainSc: 0-No pain         Complications: No apparent anesthesia complications

## 2018-03-25 NOTE — Op Note (Signed)
[facility]   OPERATIVE NOTE  PROCEDURE:   1.  Left carotid endarterectomy with CorMatrix arterial patch reconstruction  PRE-OPERATIVE DIAGNOSIS: 1.  High grade left carotid stenosis 2. Syncope  POST-OPERATIVE DIAGNOSIS: same as above   SURGEON: Leotis Pain, MD  ASSISTANT(S): none  ANESTHESIA: general  ESTIMATED BLOOD LOSS: 100 cc  FINDING(S): 1.  left carotid plaque.  SPECIMEN(S):  Carotid plaque (sent to Pathology)  INDICATIONS:   [name] is a 84 y.o. female who presents with left carotid stenosis of 90%.  I discussed with the patient the risks, benefits, and alternatives to carotid endarterectomy.  I discussed the differences between carotid stenting and carotid endarterectomy. I discussed the procedural details of carotid endarterectomy with the patient.  The patient is aware that the risks of carotid endarterectomy include but are not limited to: bleeding, infection, stroke, myocardial infarction, death, cranial nerve injuries both temporary and permanent, neck hematoma, possible airway compromise, labile blood pressure post-operatively, cerebral hyperperfusion syndrome, and possible need for additional interventions in the future. The patient is aware of the risks and agrees to proceed forward with the procedure.  DESCRIPTION: After full informed written consent was obtained from the patient, the patient was brought back to the operating room and placed supine upon the operating table.  Prior to induction, the patient received IV antibiotics.  After obtaining adequate anesthesia, the patient was placed into a modified beach chair position with a shoulder roll in place and the patient's neck slightly hyperextended and rotated away from the surgical site.  The patient was prepped in the standard fashion for a carotid endarterectomy.  I made an incision anterior to the sternocleidomastoid muscle and dissected down through the subcutaneous tissue.  The  platysmas was opened with electrocautery.  Then I dissected down to the internal jugular vein and facial vein.  The facial vein is ligated and divided between 2-0 silk ties.  This was dissected posteriorly until I obtained visualization of the common carotid artery.  This was dissected out and then a vessel loop was placed around the common carotid artery.  I then dissected in a periadventitial fashion along the common carotid artery up to the bifurcation.  I then identified the external carotid artery and the superior thyroid artery.  I placed a vessel loop around the superior thyroid artery, and I also dissected out the external carotid artery and placed a vessel loop around it. In the process of this dissection, the hypoglossal nerve was identified and protected from harm.  I then dissected out the internal carotid artery until I identified an area in the internal carotid artery clearly above the stenosis.  I dissected slightly distal to this area, and placed a vessel loop around the artery.  At this point, we gave the patient 6000 units of intravenous heparin.  After this was allowed to circulate for several minutes, I pulled up control on the vessel loops to clamp the internal carotid artery, external carotid artery, superior thyroid artery, and then the common carotid artery.  I then made an arteriotomy in the common carotid artery with a 11 blade, and extended the arteriotomy with a Potts scissor down into the common carotid artery, then I carried the arteriotomy through the bifurcation into the internal carotid artery until I reached an area that was not diseased.  At this point, I took the Pruitt-[place] shunt that previously been prepared and I inserted it into the internal carotid artery first, and then into the common  carotid artery taking care to flush and de-air prior to release of control. At this point, I started the endarterectomy in the common carotid artery with a Penfield elevator and carried  this dissection down into the common carotid artery circumferentially.  Then I transected the plaque at a segment where it was adherent and transected the plaque with Potts scissors.  I then carried this dissection up into the external carotid artery.  The plaque was extracted by unclamping the external carotid artery and performing an eversion endarterectomy.  The dissection was then carried into the internal carotid artery where a nice feathered end point was created with gentle traction.  I passed the plaque off the field as a specimen. At this point I removed all loose flecks and remaining disease possible.  At this point, I was satisfied that the minimal remaining disease was densely adherent to the wall and wall integrity was intact. The distal endpoint was tacked down with three 7-0 Prolene sutures.  I then fashioned a CorMatrix arterial patch for the artery and sewed it in place with two running stitch of 6-0 Prolene.  I started at the distal endpoint and ran one half the length of the arteriotomy.  I then cut and beveled the patch to an appropriate length to match the arteriotomy.  I started the second 6-0 Prolene at the proximal end point.  The medial suture line was completed and the lateral suture line was run approximately one quarter the length of the arteriotomy.  Prior to completing this patch angioplasty, I removed the shunt first from the internal carotid artery, from which there was excellent backbleeding, and clamped it.  Then I removed the shunt from the common carotid artery, from which there was excellent antegrade bleeding, and then clamped it.  At this point, I allowed the external carotid artery to backbleed, which was excellent.  Then I instilled heparinized saline in this patched artery and then completed the patch angioplasty in the usual fashion.  First, I released the clamp on the external carotid artery, then I released it on the common carotid artery.  After waiting a few seconds, I  then released it on the internal carotid artery. Several minutes of pressure were held and 6-0 Prolene patch sutures were used as need for hemostasis.  At this point, I placed Surgicel and Evicel topical hemostatic agents.  There was no more active bleeding in the surgical site.  The sternocleidomastoid space was closed with three interrupted 3-0 Vicryl sutures. I then reapproximated the platysma muscle with a running stitch of 3-0 Vicryl.  The skin was then closed with a running subcuticular 4-0 Monocryl.  The skin was then cleaned, dried and Dermabond was used to reinforce the skin closure.  The patient awakened and was taken to the recovery room in stable condition, following commands and moving all four extremities without any apparent deficits.    COMPLICATIONS: none  CONDITION: stable  Leotis Pain  03/25/2018, 2:57 PM    This note was created with Dragon Medical transcription system. Any errors in dictation are purely unintentional.

## 2018-03-25 NOTE — Progress Notes (Signed)
Ancef orders changed to 1g q 12hr x 2 doses for 24hr of surgical prophylaxis due to renal function  Kalel Harty D Kaelee Pfeffer, Pharm.D, BCPS Clinical Pharmacist

## 2018-03-25 NOTE — Progress Notes (Signed)
Dr Kayleen Memos looked at EKG, no real changes , Cardiology to see in ICU

## 2018-03-25 NOTE — Anesthesia Procedure Notes (Signed)
Procedure Name: Intubation Date/Time: 03/25/2018 12:50 PM Performed by: Emmie Niemann, MD Pre-anesthesia Checklist: Patient identified, Patient being monitored, Timeout performed, Emergency Drugs available and Suction available Patient Re-evaluated:Patient Re-evaluated prior to induction Oxygen Delivery Method: Circle system utilized Preoxygenation: Pre-oxygenation with 100% oxygen Induction Type: IV induction Ventilation: Mask ventilation without difficulty Laryngoscope Size: Mac and 3 Grade View: Grade II Tube type: Oral Tube size: 7.0 mm Number of attempts: 1 Airway Equipment and Method: Stylet Placement Confirmation: ETT inserted through vocal cords under direct vision,  positive ETCO2 and breath sounds checked- equal and bilateral Secured at: 22 cm Tube secured with: Tape Dental Injury: Teeth and Oropharynx as per pre-operative assessment

## 2018-03-25 NOTE — H&P (Signed)
[facility] History & Physical Update  The patient was interviewed and re-examined.  The patient's previous History and Physical has been reviewed and is unchanged.  There is no change in the plan of care. We plan to proceed with the scheduled procedure.  Leotis Pain, MD  03/25/2018, 11:39 AM

## 2018-03-25 NOTE — Anesthesia Preprocedure Evaluation (Signed)
Anesthesia Evaluation  Patient identified by MRN, date of birth, ID band Patient awake    Reviewed: Allergy & Precautions, NPO status , Patient's Chart, lab work & pertinent test results  History of Anesthesia Complications Negative for: history of anesthetic complications  Airway Mallampati: II  TM Distance: >3 FB Neck ROM: Full    Dental  (+) Upper Dentures, Missing   Pulmonary neg pulmonary ROS, neg sleep apnea, neg COPD,    breath sounds clear to auscultation- rhonchi (-) wheezing      Cardiovascular hypertension, Pt. on medications + Peripheral Vascular Disease  (-) CAD, (-) Past MI and (-) Cardiac Stents  Rhythm:Regular Rate:Normal - Systolic murmurs and - Diastolic murmurs    Neuro/Psych CVA, No Residual Symptoms negative psych ROS   GI/Hepatic Neg liver ROS, GERD  ,  Endo/Other  diabetesHypothyroidism   Renal/GU Renal InsufficiencyRenal disease     Musculoskeletal  (+) Arthritis ,   Abdominal (+) - obese,   Peds  Hematology  (+) anemia ,   Anesthesia Other Findings Past Medical History: No date: Allergy No date: Arthritis No date: CKD (chronic kidney disease), stage III (HCC) No date: GERD (gastroesophageal reflux disease) No date: Hyperlipidemia No date: Hypertension No date: Insomnia No date: Stroke [facility])   Reproductive/Obstetrics                             Anesthesia Physical Anesthesia Plan  ASA: III  Anesthesia Plan: General   Post-op Pain Management:    Induction: Intravenous  PONV Risk Score and Plan: 2 and Ondansetron and Dexamethasone  Airway Management Planned: Oral ETT  Additional Equipment: Arterial line  Intra-op Plan:   Post-operative Plan: Extubation in OR  Informed Consent: I have reviewed the patients History and Physical, chart, labs and discussed the procedure including the risks, benefits and alternatives for the proposed anesthesia with  the patient or authorized representative who has indicated his/her understanding and acceptance.   Dental advisory given  Plan Discussed with: CRNA and Anesthesiologist  Anesthesia Plan Comments:         Anesthesia Quick Evaluation

## 2018-03-25 NOTE — Anesthesia Post-op Follow-up Note (Signed)
Anesthesia QCDR form completed.        

## 2018-03-26 ENCOUNTER — Ambulatory Visit: Payer: Self-pay | Admitting: Adult Health

## 2018-03-26 ENCOUNTER — Encounter: Payer: Self-pay | Admitting: Vascular Surgery

## 2018-03-26 LAB — BASIC METABOLIC PANEL WITH GFR
Anion gap: 3 — ABNORMAL LOW (ref 5–15)
BUN: 41 mg/dL — ABNORMAL HIGH (ref 8–23)
CO2: 23 mmol/L (ref 22–32)
Calcium: 7.7 mg/dL — ABNORMAL LOW (ref 8.9–10.3)
Chloride: 112 mmol/L — ABNORMAL HIGH (ref 98–111)
Creatinine, Ser: 1.33 mg/dL — ABNORMAL HIGH (ref 0.44–1.00)
GFR calc Af Amer: 40 mL/min — ABNORMAL LOW
GFR calc non Af Amer: 34 mL/min — ABNORMAL LOW
Glucose, Bld: 104 mg/dL — ABNORMAL HIGH (ref 70–99)
Potassium: 4.3 mmol/L (ref 3.5–5.1)
Sodium: 138 mmol/L (ref 135–145)

## 2018-03-26 LAB — CBC
HCT: 24.4 % — ABNORMAL LOW (ref 35.0–47.0)
HEMOGLOBIN: 8.1 g/dL — AB (ref 12.0–16.0)
MCH: 33 pg (ref 26.0–34.0)
MCHC: 33.2 g/dL (ref 32.0–36.0)
MCV: 99.4 fL (ref 80.0–100.0)
Platelets: 169 10*3/uL (ref 150–440)
RBC: 2.46 MIL/uL — ABNORMAL LOW (ref 3.80–5.20)
RDW: 15.4 % — AB (ref 11.5–14.5)
WBC: 10.5 10*3/uL (ref 3.6–11.0)

## 2018-03-26 MED ORDER — CHLORHEXIDINE GLUCONATE CLOTH 2 % EX PADS
6.0000 | MEDICATED_PAD | Freq: Every day | CUTANEOUS | Status: DC
  Administered 2018-03-28 – 2018-03-29 (×2): 6 via TOPICAL

## 2018-03-26 MED ORDER — MUPIROCIN 2 % EX OINT
1.0000 "application " | TOPICAL_OINTMENT | Freq: Two times a day (BID) | CUTANEOUS | Status: DC
  Administered 2018-03-26 – 2018-03-28 (×5): 1 via NASAL
  Filled 2018-03-26: qty 22

## 2018-03-26 MED ORDER — HYDROMORPHONE HCL 1 MG/ML IJ SOLN: 0.5000 mg | INTRAMUSCULAR | Status: DC | PRN

## 2018-03-26 NOTE — Progress Notes (Signed)
[facility]  Daily Progress Note   Subjective  - 1 Day Post-Op  Bruising and sore, but overall doing well.  No major events.  Tolerated clears  Objective Vitals:   03/26/18 0400 03/26/18 0500 03/26/18 0600 03/26/18 0700  BP: (!) 125/47 (!) 117/43 (!) 129/104 (!) 120/47  Pulse: (!) 56 (!) 56 66 64  Resp: (!) 9 13 16 15   Temp: (!) 97.5 F (36.4 C)     TempSrc: Oral     SpO2: 100% 100% 100% 100%  Weight:      Height:        Intake/Output Summary (Last 24 hours) at 03/26/2018 0825 Last data filed at 03/26/2018 0600 Gross per 24 hour  Intake 2230.41 ml  Output 1220 ml  Net 1010.41 ml    PULM  CTAB CV  RRR VASC  Moderate bruising but swelling is mild and no hematoma  Laboratory CBC    Component Value Date/Time   WBC 10.5 03/26/2018 0517   HGB 8.1 (L) 03/26/2018 0517   HGB 10.1 (L) 11/21/2017 1132   HCT 24.4 (L) 03/26/2018 0517   HCT 31.5 (L) 11/21/2017 1132   PLT 169 03/26/2018 0517   PLT 339 11/21/2017 1132    BMET    Component Value Date/Time   NA 138 03/26/2018 0517   NA 138 11/21/2017 1132   NA 142 04/07/2014 0409   K 4.3 03/26/2018 0517   K 4.0 04/07/2014 0409   CL 112 (H) 03/26/2018 0517   CL 107 04/07/2014 0409   CO2 23 03/26/2018 0517   CO2 26 04/07/2014 0409   GLUCOSE 104 (H) 03/26/2018 0517   GLUCOSE 87 04/07/2014 0409   BUN 41 (H) 03/26/2018 0517   BUN 35 (H) 11/21/2017 1132   BUN 26 (H) 04/07/2014 0409   CREATININE 1.33 (H) 03/26/2018 0517   CREATININE 1.43 (H) 04/07/2014 0409   CALCIUM 7.7 (L) 03/26/2018 0517   CALCIUM 8.8 04/07/2014 0409   GFRNONAA 34 (L) 03/26/2018 0517   GFRNONAA 33 (L) 04/07/2014 0409   GFRAA 40 (L) 03/26/2018 0517   GFRAA 39 (L) 04/07/2014 0409    Assessment/Planning: POD #1 s/p left CEA   Clear diet  Move to the floor  Increase activity  kvo ivf    [name]  03/26/2018, 8:25 AM

## 2018-03-26 NOTE — Anesthesia Postprocedure Evaluation (Signed)
Anesthesia Post Note  Patient: [name]  Procedure(s) Performed: ENDARTERECTOMY CAROTID (Left )  Patient location during evaluation: ICU Anesthesia Type: General Level of consciousness: awake and alert and oriented Pain management: satisfactory to patient Vital Signs Assessment: post-procedure vital signs reviewed and stable Respiratory status: respiratory function stable Cardiovascular status: stable     Last Vitals:  Vitals:   03/26/18 0600 03/26/18 0700  BP: (!) 129/104 (!) 120/47  Pulse: 66 64  Resp: 16 15  Temp:    SpO2: 100% 100%    Last Pain:  Vitals:   03/26/18 0400  TempSrc: Oral  PainSc: 0-No pain                 Blima Singer

## 2018-03-26 NOTE — Progress Notes (Signed)
Left wrist where arterial line was removed from is at a grade zero with no bruising, no bleeding or swelling at sight.

## 2018-03-26 NOTE — Progress Notes (Signed)
Arterial line removed from left wrist, pressure placed to sight for 5 min, minimal/scant bleeding noted, gauze with tegaderm placed over sight. Patient educated to keep arm elevated over a pillow and to not bend bend wrist for at least an hour.

## 2018-03-26 NOTE — Progress Notes (Signed)
Report given to [facility] for patient to be transferred to room 210 with 1 bag of personal belongings, on telemetry and oxygen. Nurse left a message for her daughter [name] per patients request.

## 2018-03-26 NOTE — Anesthesia Postprocedure Evaluation (Deleted)
Anesthesia Post Note  Patient: [name]  Procedure(s) Performed: ENDARTERECTOMY CAROTID (Left )  Anesthesia Type: General Anesthetic complications: no     Last Vitals:  Vitals:   03/26/18 0600 03/26/18 0700  BP: (!) 129/104 (!) 120/47  Pulse: 66 64  Resp: 16 15  Temp:    SpO2: 100% 100%    Last Pain:  Vitals:   03/26/18 0400  TempSrc: Oral  PainSc: 0-No pain                 Blima Singer

## 2018-03-27 LAB — SURGICAL PATHOLOGY

## 2018-03-27 MED ORDER — FAMOTIDINE 20 MG PO TABS
20.0000 mg | ORAL_TABLET | Freq: Two times a day (BID) | ORAL | Status: DC
  Administered 2018-03-27: 20 mg via ORAL
  Filled 2018-03-27 (×2): qty 1

## 2018-03-27 MED ORDER — FUROSEMIDE 10 MG/ML IJ SOLN
20.0000 mg | Freq: Once | INTRAMUSCULAR | Status: AC
  Administered 2018-03-27: 20 mg via INTRAVENOUS
  Filled 2018-03-27: qty 4

## 2018-03-27 NOTE — Progress Notes (Signed)
Foley removed at [place] per MD order.

## 2018-03-27 NOTE — Plan of Care (Signed)
  Problem: Activity: Goal: Risk for activity intolerance will decrease 03/27/2018 1846 by Noelle Penner, RN Outcome: Progressing 03/27/2018 1431 by Noelle Penner, RN Outcome: Progressing  Pt was able to sit in the chair today.

## 2018-03-27 NOTE — Progress Notes (Signed)
[facility]  Daily Progress Note   Subjective  - 2 Days Post-Op  Some sore throat and congestion.  Feels swollen.  Has not been very active.  Objective Vitals:   03/27/18 0349 03/27/18 0805 03/27/18 0817 03/27/18 0818  BP: 128/60 (!) 138/46    Pulse: 98 (!) 54 74 73  Resp: 20 20    Temp: 97.9 F (36.6 C) 98 F (36.7 C)    TempSrc: Oral Oral    SpO2: 99% (!) 85% 100% 99%  Weight:      Height:        Intake/Output Summary (Last 24 hours) at 03/27/2018 1123 Last data filed at 03/27/2018 0810 Gross per 24 hour  Intake 985.33 ml  Output 2200 ml  Net -1214.67 ml    PULM  CTAB CV  RRR VASC  Neck with moderate bruising, swelling mild to moderate.    Laboratory CBC    Component Value Date/Time   WBC 10.5 03/26/2018 0517   HGB 8.1 (L) 03/26/2018 0517   HGB 10.1 (L) 11/21/2017 1132   HCT 24.4 (L) 03/26/2018 0517   HCT 31.5 (L) 11/21/2017 1132   PLT 169 03/26/2018 0517   PLT 339 11/21/2017 1132    BMET    Component Value Date/Time   NA 138 03/26/2018 0517   NA 138 11/21/2017 1132   NA 142 04/07/2014 0409   K 4.3 03/26/2018 0517   K 4.0 04/07/2014 0409   CL 112 (H) 03/26/2018 0517   CL 107 04/07/2014 0409   CO2 23 03/26/2018 0517   CO2 26 04/07/2014 0409   GLUCOSE 104 (H) 03/26/2018 0517   GLUCOSE 87 04/07/2014 0409   BUN 41 (H) 03/26/2018 0517   BUN 35 (H) 11/21/2017 1132   BUN 26 (H) 04/07/2014 0409   CREATININE 1.33 (H) 03/26/2018 0517   CREATININE 1.43 (H) 04/07/2014 0409   CALCIUM 7.7 (L) 03/26/2018 0517   CALCIUM 8.8 04/07/2014 0409   GFRNONAA 34 (L) 03/26/2018 0517   GFRNONAA 33 (L) 04/07/2014 0409   GFRAA 40 (L) 03/26/2018 0517   GFRAA 39 (L) 04/07/2014 0409    Assessment/Planning: POD #2 s/p left CEA   Will need some diuresis today.  Lasix given.  Foley will be left in until tonight due to immobility and diuresis today  Needs to increase activity  Will need to stay on soft diet with neck swelling and soreness  Not  able to discharge home today, hopefully be ready for discharge in next couple of days.    Leotis Pain  03/27/2018, 11:23 AM

## 2018-03-27 NOTE — Progress Notes (Signed)
PHARMACIST - PHYSICIAN COMMUNICATION  DR:  Lucky Cowboy  CONCERNING: IV to Oral Route Change Policy  RECOMMENDATION: This patient is receiving famotidine by the intravenous route.  Based on criteria approved by the Pharmacy and Therapeutics Committee, the intravenous medication(s) is/are being converted to the equivalent oral dose form(s).   DESCRIPTION: These criteria include:  The patient is eating (either orally or via tube) and/or has been taking other orally administered medications for a least 24 hours  The patient has no evidence of active gastrointestinal bleeding or impaired GI absorption (gastrectomy, short bowel, patient on TNA or NPO).  If you have questions about this conversion, please contact the Pharmacy Department  []   [phone] )  [facility] [x]   [phone] )  [facility] []   [phone] )  [facility] []   [phone] )  [facility] []   [phone] )  [place], [facility] 03/27/2018 12:25 PM

## 2018-03-28 MED ORDER — FUROSEMIDE 10 MG/ML IJ SOLN
20.0000 mg | Freq: Once | INTRAMUSCULAR | Status: AC
  Administered 2018-03-28: 20 mg via INTRAVENOUS
  Filled 2018-03-28: qty 4

## 2018-03-28 MED ORDER — FAMOTIDINE 20 MG PO TABS
20.0000 mg | ORAL_TABLET | Freq: Two times a day (BID) | ORAL | Status: DC
  Administered 2018-03-28 – 2018-03-29 (×3): 20 mg via ORAL
  Filled 2018-03-28 (×2): qty 1

## 2018-03-28 NOTE — Progress Notes (Signed)
[facility]  Daily Progress Note   Subjective  - 3 Days Post-Op  Feeling a little better.  Coughed up some stuff.  Sleeping better  Objective Vitals:   03/27/18 1359 03/27/18 2041 03/27/18 2042 03/28/18 0522  BP: (!) 134/31 (!) 135/39 (!) 135/43 (!) 101/57  Pulse: 87 79 80 82  Resp: 20 14  19   Temp:  98.6 F (37 C)  98.6 F (37 C)  TempSrc:  Oral  Oral  SpO2: 100% 98% 99% 95%  Weight:      Height:        Intake/Output Summary (Last 24 hours) at 03/28/2018 1144 Last data filed at 03/28/2018 1022 Gross per 24 hour  Intake 240 ml  Output 1230 ml  Net -990 ml    PULM  CTAB CV  RRR VASC  Neck bruising slowly improving.  Only mildly swollen at this point. Neuro exam intact  Laboratory CBC    Component Value Date/Time   WBC 10.5 03/26/2018 0517   HGB 8.1 (L) 03/26/2018 0517   HGB 10.1 (L) 11/21/2017 1132   HCT 24.4 (L) 03/26/2018 0517   HCT 31.5 (L) 11/21/2017 1132   PLT 169 03/26/2018 0517   PLT 339 11/21/2017 1132    BMET    Component Value Date/Time   NA 138 03/26/2018 0517   NA 138 11/21/2017 1132   NA 142 04/07/2014 0409   K 4.3 03/26/2018 0517   K 4.0 04/07/2014 0409   CL 112 (H) 03/26/2018 0517   CL 107 04/07/2014 0409   CO2 23 03/26/2018 0517   CO2 26 04/07/2014 0409   GLUCOSE 104 (H) 03/26/2018 0517   GLUCOSE 87 04/07/2014 0409   BUN 41 (H) 03/26/2018 0517   BUN 35 (H) 11/21/2017 1132   BUN 26 (H) 04/07/2014 0409   CREATININE 1.33 (H) 03/26/2018 0517   CREATININE 1.43 (H) 04/07/2014 0409   CALCIUM 7.7 (L) 03/26/2018 0517   CALCIUM 8.8 04/07/2014 0409   GFRNONAA 34 (L) 03/26/2018 0517   GFRNONAA 33 (L) 04/07/2014 0409   GFRAA 40 (L) 03/26/2018 0517   GFRAA 39 (L) 04/07/2014 0409    Assessment/Planning: POD #3 s/p left CEA   Will give another dose of Lasix today for diuresis  Sat up some.  Will sit up more this evening  Check labs in the morning and plan for discharge tomorrow.    Leotis Pain  03/28/2018,  11:44 AM

## 2018-03-28 NOTE — Progress Notes (Deleted)
PHARMACIST - PHYSICIAN COMMUNICATION  DR:   Lucky Cowboy  CONCERNING: IV to Oral Route Change Policy  RECOMMENDATION: This patient is receiving famotidine by the intravenous route.  Based on criteria approved by the Pharmacy and Therapeutics Committee, the intravenous medication(s) is/are being converted to the equivalent oral dose form(s).   DESCRIPTION: These criteria include:  The patient is eating (either orally or via tube) and/or has been taking other orally administered medications for a least 24 hours  The patient has no evidence of active gastrointestinal bleeding or impaired GI absorption (gastrectomy, short bowel, patient on TNA or NPO).  If you have questions about this conversion, please contact the Pharmacy Department  []   [phone] )  [facility] [x]   [phone] )  [facility] []   [phone] )  [facility] []   [phone] )  [facility] []   [phone] )  [place], [place] 03/28/2018 9:17 AM

## 2018-03-28 NOTE — Care Management Important Message (Signed)
Copy of signed IM left with patient in room.  

## 2018-03-29 LAB — BASIC METABOLIC PANEL
ANION GAP: 6 (ref 5–15)
BUN: 32 mg/dL — AB (ref 8–23)
CALCIUM: 9 mg/dL (ref 8.9–10.3)
CO2: 28 mmol/L (ref 22–32)
Chloride: 101 mmol/L (ref 98–111)
Creatinine, Ser: 1.46 mg/dL — ABNORMAL HIGH (ref 0.44–1.00)
GFR calc Af Amer: 36 mL/min — ABNORMAL LOW (ref >60)
GFR, EST NON AFRICAN AMERICAN: 31 mL/min — AB (ref >60)
GLUCOSE: 108 mg/dL — AB (ref 70–99)
POTASSIUM: 5 mmol/L (ref 3.5–5.1)
SODIUM: 135 mmol/L (ref 135–145)

## 2018-03-29 LAB — CBC
HCT: 27.9 % — ABNORMAL LOW (ref 35.0–47.0)
Hemoglobin: 9.6 g/dL — ABNORMAL LOW (ref 12.0–16.0)
MCH: 34.3 pg — ABNORMAL HIGH (ref 26.0–34.0)
MCHC: 34.3 g/dL (ref 32.0–36.0)
MCV: 100 fL (ref 80.0–100.0)
PLATELETS: 223 10*3/uL (ref 150–440)
RBC: 2.8 MIL/uL — AB (ref 3.80–5.20)
RDW: 15.3 % — AB (ref 11.5–14.5)
WBC: 13 10*3/uL — AB (ref 3.6–11.0)

## 2018-03-29 MED ORDER — CLOPIDOGREL BISULFATE 75 MG PO TABS: 75.0000 mg | ORAL_TABLET | Freq: Every day | ORAL | 3 refills | Status: DC

## 2018-03-29 MED ORDER — FAMOTIDINE 20 MG PO TABS: 20.0000 mg | ORAL_TABLET | Freq: Every day | ORAL | Status: DC

## 2018-03-29 MED ORDER — ASPIRIN 81 MG PO TBEC: 81.0000 mg | DELAYED_RELEASE_TABLET | Freq: Every day | ORAL | 5 refills | Status: AC

## 2018-03-29 NOTE — Care Management (Signed)
POD #4 s/p left CEA.  Will need resumption orders for home health for RN, PT, aide

## 2018-03-29 NOTE — Care Management (Signed)
Resumption order have been placed for home health.  Patient to discharge today.  Joelene Millin with [facility] notified of discharge.

## 2018-03-29 NOTE — Discharge Summary (Signed)
[facility]    Discharge Summary    Patient ID:  [name] MRN: [id number] DOB/AGE: 82-Jan-1928 82 y.o.  Admit date: 03/25/2018 Discharge date: 03/29/2018 Date of Surgery: 03/25/2018 Surgeon: Surgeon(s): Lucky Cowboy Erskine Squibb, MD  Admission Diagnosis: CAROTID ARTERY STENOSIS  Discharge Diagnoses:  CAROTID ARTERY STENOSIS  Secondary Diagnoses: Past Medical History:  Diagnosis Date  . Allergy   . Arthritis   . CKD (chronic kidney disease), stage III ([place])   . GERD (gastroesophageal reflux disease)   . Hyperlipidemia   . Hypertension   . Insomnia   . Stroke [facility])     Procedure(s): ENDARTERECTOMY CAROTID  Discharged Condition: good  HPI:  Patient with carotid stenosis and TIA and syncope.  Hospital Course:  [name] is a 84 y.o. female is S/P Right Procedure(s): ENDARTERECTOMY CAROTID Extubated: POD # 0 Physical exam: moderate neck bruising, swelling resolved, neuro exam intact Post-op wounds clean, dry, intact or healing well Pt. Ambulating, voiding and taking PO diet without difficulty. Pt pain controlled with PO pain meds. Labs as below Complications:none  Consults:    Significant Diagnostic Studies: CBC Lab Results  Component Value Date   WBC 13.0 (H) 03/29/2018   HGB 9.6 (L) 03/29/2018   HCT 27.9 (L) 03/29/2018   MCV 100.0 03/29/2018   PLT 223 03/29/2018    BMET    Component Value Date/Time   NA 135 03/29/2018 0335   NA 138 11/21/2017 1132   NA 142 04/07/2014 0409   K 5.0 03/29/2018 0335   K 4.0 04/07/2014 0409   CL 101 03/29/2018 0335   CL 107 04/07/2014 0409   CO2 28 03/29/2018 0335   CO2 26 04/07/2014 0409   GLUCOSE 108 (H) 03/29/2018 0335   GLUCOSE 87 04/07/2014 0409   BUN 32 (H) 03/29/2018 0335   BUN 35 (H) 11/21/2017 1132   BUN 26 (H) 04/07/2014 0409   CREATININE 1.46 (H) 03/29/2018 0335   CREATININE 1.43 (H) 04/07/2014 0409   CALCIUM 9.0 03/29/2018 0335   CALCIUM 8.8 04/07/2014 0409   GFRNONAA  31 (L) 03/29/2018 0335   GFRNONAA 33 (L) 04/07/2014 0409   GFRAA 36 (L) 03/29/2018 0335   GFRAA 39 (L) 04/07/2014 0409   COAG Lab Results  Component Value Date   INR 0.94 03/25/2018   INR 1.0 06/19/2013     Disposition:  Discharge to :Home Discharge Instructions    Call MD for:  redness, tenderness, or signs of infection (pain, swelling, bleeding, redness, odor or green/yellow discharge around incision site)   Complete by:  As directed    Call MD for:  severe or increased pain, loss or decreased feeling  in affected limb(s)   Complete by:  As directed    Call MD for:  temperature >100.5   Complete by:  As directed    No dressing needed   Complete by:  As directed    Replace only if drainage present   Resume previous diet   Complete by:  As directed      Allergies as of 03/29/2018      Reactions   Sulfa Antibiotics       Medication List    STOP taking these medications   cephALEXin 500 MG capsule Commonly known as:  KEFLEX     TAKE these medications   allopurinol 100 MG tablet Commonly known as:  ZYLOPRIM take 2 tablets by mouth at bedtime for gout   ALPRAZolam 0.25 MG tablet Commonly known as:  XANAX take 1 tablet by mouth at bedtime if needed for anxiety What changed:  See the new instructions.   amLODipine 2.5 MG tablet Commonly known as:  NORVASC take 1 tablet by mouth at bedtime What changed:    how much to take  how to take this  when to take this   aspirin 81 MG EC tablet Take 1 tablet (81 mg total) by mouth daily. Start taking on:  03/30/2018   benazepril 5 MG tablet Commonly known as:  LOTENSIN take 1 tablet by mouth twice a day   CENTRUM SILVER 50+WOMEN Tabs Take 1 tablet by mouth daily.   cloNIDine 0.2 mg/24hr patch Commonly known as:  CATAPRES - Dosed in mg/24 hr apply 1 patch every week   clopidogrel 75 MG tablet Commonly known as:  PLAVIX Take 1 tablet (75 mg total) by mouth daily with breakfast. Start taking on:  03/30/2018    FEROSUL 325 (65 FE) MG tablet Generic drug:  ferrous sulfate take 1 tablet by mouth twice a day   fluticasone 50 MCG/ACT nasal spray Commonly known as:  FLONASE Place 1 spray into both nostrils daily as needed for allergies.   omeprazole 20 MG capsule Commonly known as:  PRILOSEC take 1 capsule by mouth once daily FOR BELCHING AND STOMACH.   pravastatin 20 MG tablet Commonly known as:  PRAVACHOL take 1 tablet by mouth once daily   PREMARIN vaginal cream Generic drug:  conjugated estrogens Place 1 Applicatorful vaginally once a week. At night.   triamcinolone 0.025 % cream Commonly known as:  KENALOG Apply 1 application topically 2 (two) times daily.   Vitamin D3 2000 units capsule Take 2,000 Units by mouth daily.   VOLTAREN 1 % Gel Generic drug:  diclofenac sodium apply 2 grams to affected area twice a day      Verbal and written Discharge instructions given to the patient. Wound care per Discharge AVS Follow-up Information    Kris Hartmann, NP Follow up in 2 week(s).   Specialty:  Nurse Practitioner Contact information: [place] [address] [phone]           Signed: Leotis Pain, MD  03/29/2018, 6:01 PM

## 2018-03-29 NOTE — Progress Notes (Signed)
Pharmacist Renal Adjustment of Famotidine  84 yo F ordered Famotidine 20 mg PO q12h.  Will adjust dose to Famotidine 20 mg PO Q24h for Crcl of 24.5 ml/min (Crcl < 50 ml/min)  Chinita Greenland PharmD Clinical Pharmacist 03/29/2018

## 2018-03-29 NOTE — Progress Notes (Signed)
IV was removed. Discharge instructions, follow-up appointments, and prescriptions were provided to the pt. The pt was taken downstairs via wheelchair by NT.

## 2018-03-31 DIAGNOSIS — I129 Hypertensive chronic kidney disease with stage 1 through stage 4 chronic kidney disease, or unspecified chronic kidney disease: Secondary | ICD-10-CM | POA: Diagnosis not present

## 2018-03-31 DIAGNOSIS — N183 Chronic kidney disease, stage 3 (moderate): Secondary | ICD-10-CM | POA: Diagnosis not present

## 2018-03-31 DIAGNOSIS — Z87440 Personal history of urinary (tract) infections: Secondary | ICD-10-CM | POA: Diagnosis not present

## 2018-03-31 DIAGNOSIS — Z7951 Long term (current) use of inhaled steroids: Secondary | ICD-10-CM | POA: Diagnosis not present

## 2018-03-31 DIAGNOSIS — Z7902 Long term (current) use of antithrombotics/antiplatelets: Secondary | ICD-10-CM | POA: Diagnosis not present

## 2018-03-31 DIAGNOSIS — E1122 Type 2 diabetes mellitus with diabetic chronic kidney disease: Secondary | ICD-10-CM | POA: Diagnosis not present

## 2018-03-31 DIAGNOSIS — M19012 Primary osteoarthritis, left shoulder: Secondary | ICD-10-CM | POA: Diagnosis not present

## 2018-03-31 DIAGNOSIS — I739 Peripheral vascular disease, unspecified: Secondary | ICD-10-CM | POA: Diagnosis not present

## 2018-03-31 DIAGNOSIS — D519 Vitamin B12 deficiency anemia, unspecified: Secondary | ICD-10-CM | POA: Diagnosis not present

## 2018-04-03 DIAGNOSIS — M19012 Primary osteoarthritis, left shoulder: Secondary | ICD-10-CM | POA: Diagnosis not present

## 2018-04-03 DIAGNOSIS — E1122 Type 2 diabetes mellitus with diabetic chronic kidney disease: Secondary | ICD-10-CM | POA: Diagnosis not present

## 2018-04-03 DIAGNOSIS — Z7951 Long term (current) use of inhaled steroids: Secondary | ICD-10-CM | POA: Diagnosis not present

## 2018-04-03 DIAGNOSIS — Z7902 Long term (current) use of antithrombotics/antiplatelets: Secondary | ICD-10-CM | POA: Diagnosis not present

## 2018-04-03 DIAGNOSIS — Z87440 Personal history of urinary (tract) infections: Secondary | ICD-10-CM | POA: Diagnosis not present

## 2018-04-03 DIAGNOSIS — I739 Peripheral vascular disease, unspecified: Secondary | ICD-10-CM | POA: Diagnosis not present

## 2018-04-03 DIAGNOSIS — N183 Chronic kidney disease, stage 3 (moderate): Secondary | ICD-10-CM | POA: Diagnosis not present

## 2018-04-03 DIAGNOSIS — D519 Vitamin B12 deficiency anemia, unspecified: Secondary | ICD-10-CM | POA: Diagnosis not present

## 2018-04-03 DIAGNOSIS — I129 Hypertensive chronic kidney disease with stage 1 through stage 4 chronic kidney disease, or unspecified chronic kidney disease: Secondary | ICD-10-CM | POA: Diagnosis not present

## 2018-04-04 ENCOUNTER — Other Ambulatory Visit: Payer: Self-pay

## 2018-04-04 MED ORDER — OMEPRAZOLE 20 MG PO CPDR: DELAYED_RELEASE_CAPSULE | ORAL | 5 refills | Status: DC

## 2018-04-04 MED ORDER — ALPRAZOLAM 0.25 MG PO TABS: 0.2500 mg | ORAL_TABLET | Freq: Every day | ORAL | 2 refills | Status: DC

## 2018-04-05 DIAGNOSIS — D519 Vitamin B12 deficiency anemia, unspecified: Secondary | ICD-10-CM | POA: Diagnosis not present

## 2018-04-05 DIAGNOSIS — N183 Chronic kidney disease, stage 3 (moderate): Secondary | ICD-10-CM | POA: Diagnosis not present

## 2018-04-05 DIAGNOSIS — Z87440 Personal history of urinary (tract) infections: Secondary | ICD-10-CM | POA: Diagnosis not present

## 2018-04-05 DIAGNOSIS — E1122 Type 2 diabetes mellitus with diabetic chronic kidney disease: Secondary | ICD-10-CM | POA: Diagnosis not present

## 2018-04-05 DIAGNOSIS — Z7902 Long term (current) use of antithrombotics/antiplatelets: Secondary | ICD-10-CM | POA: Diagnosis not present

## 2018-04-05 DIAGNOSIS — Z7951 Long term (current) use of inhaled steroids: Secondary | ICD-10-CM | POA: Diagnosis not present

## 2018-04-05 DIAGNOSIS — M19012 Primary osteoarthritis, left shoulder: Secondary | ICD-10-CM | POA: Diagnosis not present

## 2018-04-05 DIAGNOSIS — I129 Hypertensive chronic kidney disease with stage 1 through stage 4 chronic kidney disease, or unspecified chronic kidney disease: Secondary | ICD-10-CM | POA: Diagnosis not present

## 2018-04-05 DIAGNOSIS — I739 Peripheral vascular disease, unspecified: Secondary | ICD-10-CM | POA: Diagnosis not present

## 2018-04-08 DIAGNOSIS — Z7951 Long term (current) use of inhaled steroids: Secondary | ICD-10-CM | POA: Diagnosis not present

## 2018-04-08 DIAGNOSIS — D519 Vitamin B12 deficiency anemia, unspecified: Secondary | ICD-10-CM | POA: Diagnosis not present

## 2018-04-08 DIAGNOSIS — Z87440 Personal history of urinary (tract) infections: Secondary | ICD-10-CM | POA: Diagnosis not present

## 2018-04-08 DIAGNOSIS — I129 Hypertensive chronic kidney disease with stage 1 through stage 4 chronic kidney disease, or unspecified chronic kidney disease: Secondary | ICD-10-CM | POA: Diagnosis not present

## 2018-04-08 DIAGNOSIS — I739 Peripheral vascular disease, unspecified: Secondary | ICD-10-CM | POA: Diagnosis not present

## 2018-04-08 DIAGNOSIS — N183 Chronic kidney disease, stage 3 (moderate): Secondary | ICD-10-CM | POA: Diagnosis not present

## 2018-04-08 DIAGNOSIS — E1122 Type 2 diabetes mellitus with diabetic chronic kidney disease: Secondary | ICD-10-CM | POA: Diagnosis not present

## 2018-04-08 DIAGNOSIS — Z7902 Long term (current) use of antithrombotics/antiplatelets: Secondary | ICD-10-CM | POA: Diagnosis not present

## 2018-04-08 DIAGNOSIS — M19012 Primary osteoarthritis, left shoulder: Secondary | ICD-10-CM | POA: Diagnosis not present

## 2018-04-09 DIAGNOSIS — Z7951 Long term (current) use of inhaled steroids: Secondary | ICD-10-CM | POA: Diagnosis not present

## 2018-04-09 DIAGNOSIS — E1122 Type 2 diabetes mellitus with diabetic chronic kidney disease: Secondary | ICD-10-CM | POA: Diagnosis not present

## 2018-04-09 DIAGNOSIS — Z7902 Long term (current) use of antithrombotics/antiplatelets: Secondary | ICD-10-CM | POA: Diagnosis not present

## 2018-04-09 DIAGNOSIS — M19012 Primary osteoarthritis, left shoulder: Secondary | ICD-10-CM | POA: Diagnosis not present

## 2018-04-09 DIAGNOSIS — I739 Peripheral vascular disease, unspecified: Secondary | ICD-10-CM | POA: Diagnosis not present

## 2018-04-09 DIAGNOSIS — N183 Chronic kidney disease, stage 3 (moderate): Secondary | ICD-10-CM | POA: Diagnosis not present

## 2018-04-09 DIAGNOSIS — I129 Hypertensive chronic kidney disease with stage 1 through stage 4 chronic kidney disease, or unspecified chronic kidney disease: Secondary | ICD-10-CM | POA: Diagnosis not present

## 2018-04-09 DIAGNOSIS — Z87440 Personal history of urinary (tract) infections: Secondary | ICD-10-CM | POA: Diagnosis not present

## 2018-04-09 DIAGNOSIS — D519 Vitamin B12 deficiency anemia, unspecified: Secondary | ICD-10-CM | POA: Diagnosis not present

## 2018-04-10 ENCOUNTER — Encounter: Payer: Self-pay | Admitting: Vascular Surgery

## 2018-04-11 DIAGNOSIS — M19012 Primary osteoarthritis, left shoulder: Secondary | ICD-10-CM | POA: Diagnosis not present

## 2018-04-11 DIAGNOSIS — E1122 Type 2 diabetes mellitus with diabetic chronic kidney disease: Secondary | ICD-10-CM | POA: Diagnosis not present

## 2018-04-11 DIAGNOSIS — D519 Vitamin B12 deficiency anemia, unspecified: Secondary | ICD-10-CM | POA: Diagnosis not present

## 2018-04-11 DIAGNOSIS — I739 Peripheral vascular disease, unspecified: Secondary | ICD-10-CM | POA: Diagnosis not present

## 2018-04-11 DIAGNOSIS — Z7902 Long term (current) use of antithrombotics/antiplatelets: Secondary | ICD-10-CM | POA: Diagnosis not present

## 2018-04-11 DIAGNOSIS — Z7951 Long term (current) use of inhaled steroids: Secondary | ICD-10-CM | POA: Diagnosis not present

## 2018-04-11 DIAGNOSIS — N183 Chronic kidney disease, stage 3 (moderate): Secondary | ICD-10-CM | POA: Diagnosis not present

## 2018-04-11 DIAGNOSIS — I129 Hypertensive chronic kidney disease with stage 1 through stage 4 chronic kidney disease, or unspecified chronic kidney disease: Secondary | ICD-10-CM | POA: Diagnosis not present

## 2018-04-11 DIAGNOSIS — Z87440 Personal history of urinary (tract) infections: Secondary | ICD-10-CM | POA: Diagnosis not present

## 2018-04-12 DIAGNOSIS — Z7902 Long term (current) use of antithrombotics/antiplatelets: Secondary | ICD-10-CM | POA: Diagnosis not present

## 2018-04-12 DIAGNOSIS — M19012 Primary osteoarthritis, left shoulder: Secondary | ICD-10-CM | POA: Diagnosis not present

## 2018-04-12 DIAGNOSIS — I739 Peripheral vascular disease, unspecified: Secondary | ICD-10-CM | POA: Diagnosis not present

## 2018-04-12 DIAGNOSIS — Z87440 Personal history of urinary (tract) infections: Secondary | ICD-10-CM | POA: Diagnosis not present

## 2018-04-12 DIAGNOSIS — N183 Chronic kidney disease, stage 3 (moderate): Secondary | ICD-10-CM | POA: Diagnosis not present

## 2018-04-12 DIAGNOSIS — E1122 Type 2 diabetes mellitus with diabetic chronic kidney disease: Secondary | ICD-10-CM | POA: Diagnosis not present

## 2018-04-12 DIAGNOSIS — Z7951 Long term (current) use of inhaled steroids: Secondary | ICD-10-CM | POA: Diagnosis not present

## 2018-04-12 DIAGNOSIS — D519 Vitamin B12 deficiency anemia, unspecified: Secondary | ICD-10-CM | POA: Diagnosis not present

## 2018-04-12 DIAGNOSIS — I129 Hypertensive chronic kidney disease with stage 1 through stage 4 chronic kidney disease, or unspecified chronic kidney disease: Secondary | ICD-10-CM | POA: Diagnosis not present

## 2018-04-16 ENCOUNTER — Ambulatory Visit (INDEPENDENT_AMBULATORY_CARE_PROVIDER_SITE_OTHER): Payer: Medicare HMO | Admitting: Vascular Surgery

## 2018-04-16 ENCOUNTER — Encounter (INDEPENDENT_AMBULATORY_CARE_PROVIDER_SITE_OTHER): Payer: Medicare HMO

## 2018-04-22 DIAGNOSIS — E1151 Type 2 diabetes mellitus with diabetic peripheral angiopathy without gangrene: Secondary | ICD-10-CM | POA: Diagnosis not present

## 2018-04-22 DIAGNOSIS — I129 Hypertensive chronic kidney disease with stage 1 through stage 4 chronic kidney disease, or unspecified chronic kidney disease: Secondary | ICD-10-CM | POA: Diagnosis not present

## 2018-04-22 DIAGNOSIS — M19012 Primary osteoarthritis, left shoulder: Secondary | ICD-10-CM | POA: Diagnosis not present

## 2018-04-22 DIAGNOSIS — Z87440 Personal history of urinary (tract) infections: Secondary | ICD-10-CM | POA: Diagnosis not present

## 2018-04-22 DIAGNOSIS — E1122 Type 2 diabetes mellitus with diabetic chronic kidney disease: Secondary | ICD-10-CM | POA: Diagnosis not present

## 2018-04-22 DIAGNOSIS — Z48812 Encounter for surgical aftercare following surgery on the circulatory system: Secondary | ICD-10-CM | POA: Diagnosis not present

## 2018-04-22 DIAGNOSIS — Z7902 Long term (current) use of antithrombotics/antiplatelets: Secondary | ICD-10-CM | POA: Diagnosis not present

## 2018-04-22 DIAGNOSIS — N183 Chronic kidney disease, stage 3 (moderate): Secondary | ICD-10-CM | POA: Diagnosis not present

## 2018-04-22 DIAGNOSIS — D519 Vitamin B12 deficiency anemia, unspecified: Secondary | ICD-10-CM | POA: Diagnosis not present

## 2018-04-23 ENCOUNTER — Ambulatory Visit (INDEPENDENT_AMBULATORY_CARE_PROVIDER_SITE_OTHER): Payer: Medicare HMO | Admitting: Vascular Surgery

## 2018-04-23 ENCOUNTER — Ambulatory Visit: Payer: Self-pay | Admitting: Nurse Practitioner

## 2018-04-23 ENCOUNTER — Encounter (INDEPENDENT_AMBULATORY_CARE_PROVIDER_SITE_OTHER): Payer: Self-pay | Admitting: Vascular Surgery

## 2018-04-23 VITALS — BP 153/77 | HR 100 | Resp 16 | Ht 62.0 in | Wt 162.6 lb

## 2018-04-23 DIAGNOSIS — I6522 Occlusion and stenosis of left carotid artery: Secondary | ICD-10-CM

## 2018-04-23 DIAGNOSIS — E785 Hyperlipidemia, unspecified: Secondary | ICD-10-CM

## 2018-04-23 DIAGNOSIS — I10 Essential (primary) hypertension: Secondary | ICD-10-CM

## 2018-04-23 NOTE — Assessment & Plan Note (Signed)
lipid control important in reducing the progression of atherosclerotic disease. Continue statin therapy  

## 2018-04-23 NOTE — Assessment & Plan Note (Signed)
The patient is doing well status post left carotid endarterectomy.  She has a scheduled appointment about 3 months and she should keep that.  She can resume all normal activities.  Continue current medical regimen

## 2018-04-23 NOTE — Assessment & Plan Note (Signed)
blood pressure control important in reducing the progression of atherosclerotic disease. On appropriate oral medications.  

## 2018-04-23 NOTE — Progress Notes (Signed)
Patient ID: [name], female   DOB: Apr 23, 1927, 84 y.o.   MRN: [id number]  Chief Complaint  Patient presents with  . Follow-up    2week follow up    HPI [name] is a 84 y.o. female.  Patient returns about 2 weeks after left carotid endarterectomy for high-grade stenosis.  Overall she is doing well.  She complains of easy bruising on her aspirin and Plavix.  No focal neurologic symptoms.  Her neck swelling and bruising have almost completely resolved.   Past Medical History:  Diagnosis Date  . Allergy   . Arthritis   . CKD (chronic kidney disease), stage III ([place])   . GERD (gastroesophageal reflux disease)   . Hyperlipidemia   . Hypertension   . Insomnia   . Stroke [facility])     Past Surgical History:  Procedure Laterality Date  . APPENDECTOMY    . BACK SURGERY    . CAROTID ENDARTERECTOMY Right   . CHOLECYSTECTOMY    . ENDARTERECTOMY Left 03/25/2018   Procedure: ENDARTERECTOMY CAROTID;  Surgeon: Algernon Huxley, MD;  Location: [facility] ORS;  Service: Vascular;  Laterality: Left;  . EYE SURGERY Bilateral   . FOOT SURGERY Left   . HERNIA REPAIR    . HIP SURGERY Left   . TONSILLECTOMY        Allergies  Allergen Reactions  . Sulfa Antibiotics     Current Outpatient Medications  Medication Sig Dispense Refill  . allopurinol (ZYLOPRIM) 100 MG tablet take 2 tablets by mouth at bedtime for gout 60 tablet 6  . ALPRAZolam (XANAX) 0.25 MG tablet Take 1 tablet (0.25 mg total) by mouth at bedtime. 30 tablet 2  . amLODipine (NORVASC) 2.5 MG tablet take 1 tablet by mouth at bedtime (Patient taking differently: take 1 tablet by mouth at lunch) 30 tablet 6  . aspirin EC 81 MG EC tablet Take 1 tablet (81 mg total) by mouth daily. 30 tablet 5  . benazepril (LOTENSIN) 5 MG tablet take 1 tablet by mouth twice a day 60 tablet 6  . Cholecalciferol (VITAMIN D3) 2000 units capsule Take 2,000 Units by mouth daily.     . cloNIDine (CATAPRES - DOSED IN MG/24 HR) 0.2 mg/24hr patch  apply 1 patch every week 4 patch 5  . clopidogrel (PLAVIX) 75 MG tablet Take 1 tablet (75 mg total) by mouth daily with breakfast. 30 tablet 3  . conjugated estrogens (PREMARIN) vaginal cream Place 1 Applicatorful vaginally once a week. At night.    . FEROSUL 325 (65 Fe) MG tablet take 1 tablet by mouth twice a day 60 tablet 5  . fluticasone (FLONASE) 50 MCG/ACT nasal spray Place 1 spray into both nostrils daily as needed for allergies.     . Multiple Vitamins-Minerals (CENTRUM SILVER 50+WOMEN) TABS Take 1 tablet by mouth daily.    Marland Kitchen omeprazole (PRILOSEC) 20 MG capsule take 1 capsule by mouth once daily FOR BELCHING AND STOMACH. 30 capsule 5  . pravastatin (PRAVACHOL) 20 MG tablet take 1 tablet by mouth once daily 30 tablet 6  . triamcinolone (KENALOG) 0.025 % cream Apply 1 application topically 2 (two) times daily. 80 g 2  . VOLTAREN 1 % GEL apply 2 grams to affected area twice a day 200 g 12   No current facility-administered medications for this visit.         Physical Exam BP (!) 153/77 (BP Location: Left Arm)   Pulse 100   Resp 16  Ht 5\' 2"  (1.575 m)   Wt 162 lb 9.6 oz (73.8 kg)   BMI 29.74 kg/m  Gen:  WD/WN, NAD Skin: incision C/D/I. Neuro: No focal neurologic deficits noted.     Assessment/Plan:  Hyperlipidemia lipid control important in reducing the progression of atherosclerotic disease. Continue statin therapy   Essential hypertension, benign blood pressure control important in reducing the progression of atherosclerotic disease. On appropriate oral medications.   Carotid stenosis, left The patient is doing well status post left carotid endarterectomy.  She has a scheduled appointment about 3 months and she should keep that.  She can resume all normal activities.  Continue current medical regimen      Leotis Pain 04/23/2018, 2:40 PM   This note was created with Dragon medical transcription system.  Any errors from dictation are unintentional.

## 2018-04-25 DIAGNOSIS — M19012 Primary osteoarthritis, left shoulder: Secondary | ICD-10-CM | POA: Diagnosis not present

## 2018-04-25 DIAGNOSIS — Z7902 Long term (current) use of antithrombotics/antiplatelets: Secondary | ICD-10-CM | POA: Diagnosis not present

## 2018-04-25 DIAGNOSIS — N183 Chronic kidney disease, stage 3 (moderate): Secondary | ICD-10-CM | POA: Diagnosis not present

## 2018-04-25 DIAGNOSIS — Z48812 Encounter for surgical aftercare following surgery on the circulatory system: Secondary | ICD-10-CM | POA: Diagnosis not present

## 2018-04-25 DIAGNOSIS — E1122 Type 2 diabetes mellitus with diabetic chronic kidney disease: Secondary | ICD-10-CM | POA: Diagnosis not present

## 2018-04-25 DIAGNOSIS — E1151 Type 2 diabetes mellitus with diabetic peripheral angiopathy without gangrene: Secondary | ICD-10-CM | POA: Diagnosis not present

## 2018-04-25 DIAGNOSIS — D519 Vitamin B12 deficiency anemia, unspecified: Secondary | ICD-10-CM | POA: Diagnosis not present

## 2018-04-25 DIAGNOSIS — I129 Hypertensive chronic kidney disease with stage 1 through stage 4 chronic kidney disease, or unspecified chronic kidney disease: Secondary | ICD-10-CM | POA: Diagnosis not present

## 2018-04-25 DIAGNOSIS — Z87440 Personal history of urinary (tract) infections: Secondary | ICD-10-CM | POA: Diagnosis not present

## 2018-04-30 ENCOUNTER — Ambulatory Visit (INDEPENDENT_AMBULATORY_CARE_PROVIDER_SITE_OTHER): Payer: Medicare HMO | Admitting: Nurse Practitioner

## 2018-04-30 ENCOUNTER — Encounter: Payer: Self-pay | Admitting: Nurse Practitioner

## 2018-04-30 VITALS — BP 142/70 | HR 93 | Resp 16 | Ht 62.0 in | Wt 160.0 lb

## 2018-04-30 DIAGNOSIS — D519 Vitamin B12 deficiency anemia, unspecified: Secondary | ICD-10-CM

## 2018-04-30 DIAGNOSIS — I6522 Occlusion and stenosis of left carotid artery: Secondary | ICD-10-CM | POA: Diagnosis not present

## 2018-04-30 DIAGNOSIS — R531 Weakness: Secondary | ICD-10-CM | POA: Diagnosis not present

## 2018-04-30 DIAGNOSIS — I10 Essential (primary) hypertension: Secondary | ICD-10-CM

## 2018-04-30 NOTE — Progress Notes (Signed)
[facility] [place],  [id number]  Internal MEDICINE  Office Visit Note  Patient Name: [name]  [id number]  [id number]  Date of Service: 05/08/2018    Pt is here for recent hospital follow up.    Chief Complaint  Patient presents with  . Hypertension  . Hyperlipidemia  . Gastroesophageal Reflux     The patient was recently hospitalized for recent syncopal episode. .Left carotid artery has been occluded. She had endarterectomy 03/25/2018. She has been followed per vein and vascular.  Dr. Lucky Cowboy recommended the endarterectomy to prevent stroke. She is currently on plavix and baby aspirin. Does have a lot of bruising. She has nurses and PT coming to her home and helping her to recover from the surgery. She does still feel weak, but overall, feels ok. She has just recently started back on regular diet. Had follow up with her vein and vascular last Tuesday and recovery is as expected. She will go back again in 08/2018 for repeat ultrasound and follow up .     Current Medication: Outpatient Encounter Medications as of 04/30/2018  Medication Sig Note  . allopurinol (ZYLOPRIM) 100 MG tablet take 2 tablets by mouth at bedtime for gout   . ALPRAZolam (XANAX) 0.25 MG tablet Take 1 tablet (0.25 mg total) by mouth at bedtime.   Marland Kitchen aspirin EC 81 MG EC tablet Take 1 tablet (81 mg total) by mouth daily.   . benazepril (LOTENSIN) 5 MG tablet take 1 tablet by mouth twice a day   . Cholecalciferol (VITAMIN D3) 2000 units capsule Take 2,000 Units by mouth daily.    . cloNIDine (CATAPRES - DOSED IN MG/24 HR) 0.2 mg/24hr patch apply 1 patch every week 03/17/2018: Every Saturday  . clopidogrel (PLAVIX) 75 MG tablet Take 1 tablet (75 mg total) by mouth daily with breakfast.   . conjugated estrogens (PREMARIN) vaginal cream Place 1 Applicatorful vaginally once a week. At night.   . FEROSUL 325 (65 Fe) MG tablet take 1 tablet by mouth twice a day   . fluticasone (FLONASE) 50  MCG/ACT nasal spray Place 1 spray into both nostrils daily as needed for allergies.    . Multiple Vitamins-Minerals (CENTRUM SILVER 50+WOMEN) TABS Take 1 tablet by mouth daily.   Marland Kitchen omeprazole (PRILOSEC) 20 MG capsule take 1 capsule by mouth once daily FOR BELCHING AND STOMACH.   . pravastatin (PRAVACHOL) 20 MG tablet take 1 tablet by mouth once daily   . triamcinolone (KENALOG) 0.025 % cream Apply 1 application topically 2 (two) times daily.   . VOLTAREN 1 % GEL apply 2 grams to affected area twice a day   . [DISCONTINUED] amLODipine (NORVASC) 2.5 MG tablet take 1 tablet by mouth at bedtime (Patient taking differently: take 1 tablet by mouth at lunch)    No facility-administered encounter medications on file as of 04/30/2018.     Surgical History: Past Surgical History:  Procedure Laterality Date  . APPENDECTOMY    . BACK SURGERY    . CAROTID ENDARTERECTOMY Right   . CHOLECYSTECTOMY    . ENDARTERECTOMY Left 03/25/2018   Procedure: ENDARTERECTOMY CAROTID;  Surgeon: Algernon Huxley, MD;  Location: [facility] ORS;  Service: Vascular;  Laterality: Left;  . EYE SURGERY Bilateral   . FOOT SURGERY Left   . HERNIA REPAIR    . HIP SURGERY Left   . TONSILLECTOMY      Medical History: Past Medical History:  Diagnosis Date  . Allergy   .  Arthritis   . CKD (chronic kidney disease), stage III ([place])   . GERD (gastroesophageal reflux disease)   . Hyperlipidemia   . Hypertension   . Insomnia   . Stroke [facility])     Family History: Family History  Problem Relation Age of Onset  . Congestive Heart Failure Mother   . Heart disease Mother   . Heart disease Father   . Diabetes Brother     Social History   Socioeconomic History  . Marital status: Widowed    Spouse name: Not on file  . Number of children: Not on file  . Years of education: Not on file  . Highest education level: Not on file  Occupational History  . Not on file  Social Needs  . Financial resource strain: Not on file  . Food  insecurity:    Worry: Not on file    Inability: Not on file  . Transportation needs:    Medical: Not on file    Non-medical: Not on file  Tobacco Use  . Smoking status: Never Smoker  . Smokeless tobacco: Never Used  Substance and Sexual Activity  . Alcohol use: No  . Drug use: No  . Sexual activity: Not on file  Lifestyle  . Physical activity:    Days per week: Not on file    Minutes per session: Not on file  . Stress: Not on file  Relationships  . Social connections:    Talks on phone: Not on file    Gets together: Not on file    Attends religious service: Not on file    Active member of club or organization: Not on file    Attends meetings of clubs or organizations: Not on file    Relationship status: Not on file  . Intimate partner violence:    Fear of current or ex partner: Not on file    Emotionally abused: Not on file    Physically abused: Not on file    Forced sexual activity: Not on file  Other Topics Concern  . Not on file  Social History Narrative  . Not on file      Review of Systems  Constitutional: Positive for activity change. Negative for chills, fatigue and unexpected weight change.  HENT: Negative for congestion, ear pain, postnasal drip, rhinorrhea, sinus pressure, sinus pain and sore throat.   Eyes: Negative.   Respiratory: Negative for chest tightness, shortness of breath and wheezing.   Cardiovascular: Negative for chest pain, palpitations and leg swelling.       BP elevated today.  Gastrointestinal: Negative for constipation, diarrhea, nausea and vomiting.  Endocrine: Negative for cold intolerance, heat intolerance, polydipsia, polyphagia and polyuria.  Genitourinary: Negative.  Negative for dysuria, flank pain and frequency.  Musculoskeletal: Positive for arthralgias and back pain.  Skin: Negative for rash.       Bruising. New surgical scar on left side of neck appears to be healing well.   Allergic/Immunologic: Negative for environmental  allergies.  Neurological: Positive for weakness. Negative for dizziness and headaches.  Hematological: Negative for adenopathy.  Psychiatric/Behavioral: Negative for dysphoric mood. The patient is nervous/anxious.     Today's Vitals   04/30/18 0923  BP: (!) 142/70  Pulse: 93  Resp: 16  SpO2: 95%  Weight: 160 lb (72.6 kg)  Height: 5\' 2"  (1.575 m)    Physical Exam  Constitutional: She is oriented to person, place, and time. She appears well-developed and well-nourished. No distress.  HENT:  Head:  Normocephalic and atraumatic.  Right Ear: External ear and ear canal normal.  Left Ear: External ear and ear canal normal.  Nose: Nose normal.  Mouth/Throat: Oropharynx is clear and moist. No oropharyngeal exudate.  Eyes: Pupils are equal, round, and reactive to light. EOM are normal.  Neck: Normal range of motion. Neck supple. No JVD present. No tracheal deviation present. No thyromegaly present.  New surgical scar from left endarterectomy.   Cardiovascular: Normal rate, regular rhythm and normal heart sounds. Exam reveals no gallop and no friction rub.  No murmur heard. Pulmonary/Chest: Effort normal and breath sounds normal. No respiratory distress. She has no wheezes. She has no rales. She exhibits no tenderness.  Abdominal: Soft. Bowel sounds are normal. There is no tenderness.  Musculoskeletal: Normal range of motion.  Mild/moderate generalized weakness.   Lymphadenopathy:    She has no cervical adenopathy.  Neurological: She is alert and oriented to person, place, and time. No cranial nerve deficit.  Skin: Skin is warm and dry. She is not diaphoretic.  Psychiatric: She has a normal mood and affect. Her behavior is normal. Judgment and thought content normal.  Nursing note and vitals reviewed.  Assessment/Plan:  1. Occlusion and stenosis of left carotid artery Recent hospitalization for syncopal episode due to occlusion of left carotid artery. New endarterectomy of left carotid  artery which is healing well. Will continue to see vein and vascular routinely.   2. Essential hypertension, benign Stable. Continue bp medication as prescribed.   3. Anemia due to vitamin B12 deficiency, unspecified B12 deficiency type Continue B12 injections at home as prescribed.   4. Generalized weakness Continue OT/PT as scheduled.   General Counseling: Deerica verbalizes understanding of the findings of todays visit and agrees with plan of treatment. I have discussed any further diagnostic evaluation that may be needed or ordered today. We also reviewed her medications today. she has been encouraged to call the office with any questions or concerns that should arise related to todays visit.    Counseling:  This patient was seen by Leretha Pol FNP Collaboration with Dr Lavera Guise as a part of collaborative care agreement   I have reviewed all medical records from hospital follow up including radiology reports and consults from other physicians. Appropriate follow up diagnostics will be scheduled as needed. Patient/ Family understands the plan of treatment. Time spent 25 minutes.   Dr Lavera Guise, MD Internal Medicine

## 2018-05-03 DIAGNOSIS — N183 Chronic kidney disease, stage 3 (moderate): Secondary | ICD-10-CM | POA: Diagnosis not present

## 2018-05-03 DIAGNOSIS — M19012 Primary osteoarthritis, left shoulder: Secondary | ICD-10-CM | POA: Diagnosis not present

## 2018-05-03 DIAGNOSIS — Z7902 Long term (current) use of antithrombotics/antiplatelets: Secondary | ICD-10-CM | POA: Diagnosis not present

## 2018-05-03 DIAGNOSIS — E1122 Type 2 diabetes mellitus with diabetic chronic kidney disease: Secondary | ICD-10-CM | POA: Diagnosis not present

## 2018-05-03 DIAGNOSIS — D519 Vitamin B12 deficiency anemia, unspecified: Secondary | ICD-10-CM | POA: Diagnosis not present

## 2018-05-03 DIAGNOSIS — Z87440 Personal history of urinary (tract) infections: Secondary | ICD-10-CM | POA: Diagnosis not present

## 2018-05-03 DIAGNOSIS — Z48812 Encounter for surgical aftercare following surgery on the circulatory system: Secondary | ICD-10-CM | POA: Diagnosis not present

## 2018-05-03 DIAGNOSIS — I129 Hypertensive chronic kidney disease with stage 1 through stage 4 chronic kidney disease, or unspecified chronic kidney disease: Secondary | ICD-10-CM | POA: Diagnosis not present

## 2018-05-03 DIAGNOSIS — E1151 Type 2 diabetes mellitus with diabetic peripheral angiopathy without gangrene: Secondary | ICD-10-CM | POA: Diagnosis not present

## 2018-05-07 ENCOUNTER — Other Ambulatory Visit: Payer: Self-pay | Admitting: Nurse Practitioner

## 2018-05-07 DIAGNOSIS — N183 Chronic kidney disease, stage 3 (moderate): Secondary | ICD-10-CM | POA: Diagnosis not present

## 2018-05-07 DIAGNOSIS — E1151 Type 2 diabetes mellitus with diabetic peripheral angiopathy without gangrene: Secondary | ICD-10-CM | POA: Diagnosis not present

## 2018-05-07 DIAGNOSIS — I129 Hypertensive chronic kidney disease with stage 1 through stage 4 chronic kidney disease, or unspecified chronic kidney disease: Secondary | ICD-10-CM | POA: Diagnosis not present

## 2018-05-07 DIAGNOSIS — D519 Vitamin B12 deficiency anemia, unspecified: Secondary | ICD-10-CM | POA: Diagnosis not present

## 2018-05-07 DIAGNOSIS — Z7902 Long term (current) use of antithrombotics/antiplatelets: Secondary | ICD-10-CM | POA: Diagnosis not present

## 2018-05-07 DIAGNOSIS — E1122 Type 2 diabetes mellitus with diabetic chronic kidney disease: Secondary | ICD-10-CM | POA: Diagnosis not present

## 2018-05-07 DIAGNOSIS — M19012 Primary osteoarthritis, left shoulder: Secondary | ICD-10-CM | POA: Diagnosis not present

## 2018-05-07 DIAGNOSIS — Z87440 Personal history of urinary (tract) infections: Secondary | ICD-10-CM | POA: Diagnosis not present

## 2018-05-07 DIAGNOSIS — Z48812 Encounter for surgical aftercare following surgery on the circulatory system: Secondary | ICD-10-CM | POA: Diagnosis not present

## 2018-05-07 MED ORDER — AMLODIPINE BESYLATE 2.5 MG PO TABS: ORAL_TABLET | ORAL | 6 refills | Status: AC

## 2018-05-10 DIAGNOSIS — N183 Chronic kidney disease, stage 3 (moderate): Secondary | ICD-10-CM | POA: Diagnosis not present

## 2018-05-10 DIAGNOSIS — E1122 Type 2 diabetes mellitus with diabetic chronic kidney disease: Secondary | ICD-10-CM | POA: Diagnosis not present

## 2018-05-10 DIAGNOSIS — Z7902 Long term (current) use of antithrombotics/antiplatelets: Secondary | ICD-10-CM | POA: Diagnosis not present

## 2018-05-10 DIAGNOSIS — Z87440 Personal history of urinary (tract) infections: Secondary | ICD-10-CM | POA: Diagnosis not present

## 2018-05-10 DIAGNOSIS — I129 Hypertensive chronic kidney disease with stage 1 through stage 4 chronic kidney disease, or unspecified chronic kidney disease: Secondary | ICD-10-CM | POA: Diagnosis not present

## 2018-05-10 DIAGNOSIS — M19012 Primary osteoarthritis, left shoulder: Secondary | ICD-10-CM | POA: Diagnosis not present

## 2018-05-10 DIAGNOSIS — E1151 Type 2 diabetes mellitus with diabetic peripheral angiopathy without gangrene: Secondary | ICD-10-CM | POA: Diagnosis not present

## 2018-05-10 DIAGNOSIS — Z48812 Encounter for surgical aftercare following surgery on the circulatory system: Secondary | ICD-10-CM | POA: Diagnosis not present

## 2018-05-10 DIAGNOSIS — D519 Vitamin B12 deficiency anemia, unspecified: Secondary | ICD-10-CM | POA: Diagnosis not present

## 2018-05-12 DIAGNOSIS — S0083XA Contusion of other part of head, initial encounter: Secondary | ICD-10-CM | POA: Diagnosis not present

## 2018-05-12 DIAGNOSIS — S0990XA Unspecified injury of head, initial encounter: Secondary | ICD-10-CM | POA: Diagnosis not present

## 2018-05-12 DIAGNOSIS — Z87891 Personal history of nicotine dependence: Secondary | ICD-10-CM | POA: Diagnosis not present

## 2018-05-12 DIAGNOSIS — S0993XA Unspecified injury of face, initial encounter: Secondary | ICD-10-CM | POA: Diagnosis not present

## 2018-05-12 DIAGNOSIS — Z882 Allergy status to sulfonamides status: Secondary | ICD-10-CM | POA: Diagnosis not present

## 2018-05-12 DIAGNOSIS — S51011A Laceration without foreign body of right elbow, initial encounter: Secondary | ICD-10-CM | POA: Diagnosis not present

## 2018-05-12 DIAGNOSIS — Z7902 Long term (current) use of antithrombotics/antiplatelets: Secondary | ICD-10-CM | POA: Diagnosis not present

## 2018-05-12 DIAGNOSIS — Z79899 Other long term (current) drug therapy: Secondary | ICD-10-CM | POA: Diagnosis not present

## 2018-05-12 DIAGNOSIS — I10 Essential (primary) hypertension: Secondary | ICD-10-CM | POA: Diagnosis not present

## 2018-05-17 DIAGNOSIS — E1122 Type 2 diabetes mellitus with diabetic chronic kidney disease: Secondary | ICD-10-CM | POA: Diagnosis not present

## 2018-05-17 DIAGNOSIS — I129 Hypertensive chronic kidney disease with stage 1 through stage 4 chronic kidney disease, or unspecified chronic kidney disease: Secondary | ICD-10-CM | POA: Diagnosis not present

## 2018-05-17 DIAGNOSIS — M19012 Primary osteoarthritis, left shoulder: Secondary | ICD-10-CM | POA: Diagnosis not present

## 2018-05-17 DIAGNOSIS — N183 Chronic kidney disease, stage 3 (moderate): Secondary | ICD-10-CM | POA: Diagnosis not present

## 2018-05-17 DIAGNOSIS — D519 Vitamin B12 deficiency anemia, unspecified: Secondary | ICD-10-CM | POA: Diagnosis not present

## 2018-05-17 DIAGNOSIS — E1151 Type 2 diabetes mellitus with diabetic peripheral angiopathy without gangrene: Secondary | ICD-10-CM | POA: Diagnosis not present

## 2018-05-17 DIAGNOSIS — E782 Mixed hyperlipidemia: Secondary | ICD-10-CM | POA: Diagnosis not present

## 2018-05-17 DIAGNOSIS — G2581 Restless legs syndrome: Secondary | ICD-10-CM | POA: Diagnosis not present

## 2018-05-17 DIAGNOSIS — Z7902 Long term (current) use of antithrombotics/antiplatelets: Secondary | ICD-10-CM | POA: Diagnosis not present

## 2018-05-21 ENCOUNTER — Ambulatory Visit (INDEPENDENT_AMBULATORY_CARE_PROVIDER_SITE_OTHER): Payer: Medicare HMO | Admitting: Adult Health

## 2018-05-21 ENCOUNTER — Encounter: Payer: Self-pay | Admitting: Adult Health

## 2018-05-21 VITALS — BP 152/102 | HR 95 | Temp 98.2°F | Resp 16 | Ht 62.0 in | Wt 160.0 lb

## 2018-05-21 DIAGNOSIS — Y92009 Unspecified place in unspecified non-institutional (private) residence as the place of occurrence of the external cause: Principal | ICD-10-CM

## 2018-05-21 DIAGNOSIS — W19XXXD Unspecified fall, subsequent encounter: Secondary | ICD-10-CM

## 2018-05-21 DIAGNOSIS — I6522 Occlusion and stenosis of left carotid artery: Secondary | ICD-10-CM | POA: Diagnosis not present

## 2018-05-21 DIAGNOSIS — R531 Weakness: Secondary | ICD-10-CM

## 2018-05-21 DIAGNOSIS — I10 Essential (primary) hypertension: Secondary | ICD-10-CM | POA: Diagnosis not present

## 2018-05-21 DIAGNOSIS — S0083XD Contusion of other part of head, subsequent encounter: Secondary | ICD-10-CM

## 2018-05-21 NOTE — Patient Instructions (Signed)

## 2018-05-21 NOTE — Progress Notes (Signed)
[facility] [place], [place] [address]  Internal MEDICINE  Office Visit Note  Patient Name: [name]  [id number]  [id number]  Date of Service: 05/21/2018     Chief Complaint  Patient presents with  . Hospitalization Follow-up    fell in the house on sunday a week ago, was taken off plavix and aspirin till seen today      HPI Pt is here for recent hospital follow up. Pt was trying to walk with her walker and carry a pair a shoes.  She lost her balance and fell forward on her walker and shoes.  She was seen in the ED due to not being able to get her right arm skin tear to stop bleeding.  She managed to black both her eyes, and she has multiple bruises scattered about her lower extremities. She denies losing consciousness.  She had a negative head CT in the ER. She was on Plavix, and aspirin therefore the concern was for excessive bleeding, and ICH.  She reports she is doing better at this time.  She continues to manage using a walker, and her bruises are healing at this time.      Current Medication: Outpatient Encounter Medications as of 05/21/2018  Medication Sig Note  . allopurinol (ZYLOPRIM) 100 MG tablet take 2 tablets by mouth at bedtime for gout   . ALPRAZolam (XANAX) 0.25 MG tablet Take 1 tablet (0.25 mg total) by mouth at bedtime.   Marland Kitchen amLODipine (NORVASC) 2.5 MG tablet take 1 tablet by mouth at lunch   . benazepril (LOTENSIN) 5 MG tablet take 1 tablet by mouth twice a day   . Cholecalciferol (VITAMIN D3) 2000 units capsule Take 2,000 Units by mouth daily.    . cloNIDine (CATAPRES - DOSED IN MG/24 HR) 0.2 mg/24hr patch apply 1 patch every week 03/17/2018: Every Saturday  . conjugated estrogens (PREMARIN) vaginal cream Place 1 Applicatorful vaginally once a week. At night.   . FEROSUL 325 (65 Fe) MG tablet take 1 tablet by mouth twice a day   . fluticasone (FLONASE) 50 MCG/ACT nasal spray Place 1 spray into both nostrils daily as needed for  allergies.    . Multiple Vitamins-Minerals (CENTRUM SILVER 50+WOMEN) TABS Take 1 tablet by mouth daily.   Marland Kitchen omeprazole (PRILOSEC) 20 MG capsule take 1 capsule by mouth once daily FOR BELCHING AND STOMACH.   . pravastatin (PRAVACHOL) 20 MG tablet take 1 tablet by mouth once daily   . triamcinolone (KENALOG) 0.025 % cream Apply 1 application topically 2 (two) times daily.   . VOLTAREN 1 % GEL apply 2 grams to affected area twice a day   . aspirin EC 81 MG EC tablet Take 1 tablet (81 mg total) by mouth daily. (Patient not taking: Reported on 05/21/2018)   . clopidogrel (PLAVIX) 75 MG tablet Take 1 tablet (75 mg total) by mouth daily with breakfast. (Patient not taking: Reported on 05/21/2018)    No facility-administered encounter medications on file as of 05/21/2018.     Surgical History: Past Surgical History:  Procedure Laterality Date  . APPENDECTOMY    . BACK SURGERY    . CAROTID ENDARTERECTOMY Right   . CHOLECYSTECTOMY    . ENDARTERECTOMY Left 03/25/2018   Procedure: ENDARTERECTOMY CAROTID;  Surgeon: Algernon Huxley, MD;  Location: [facility] ORS;  Service: Vascular;  Laterality: Left;  . EYE SURGERY Bilateral   . FOOT SURGERY Left   . HERNIA REPAIR    .  HIP SURGERY Left   . TONSILLECTOMY      Medical History: Past Medical History:  Diagnosis Date  . Allergy   . Arthritis   . CKD (chronic kidney disease), stage III ([place])   . GERD (gastroesophageal reflux disease)   . Hyperlipidemia   . Hypertension   . Insomnia   . Stroke [facility])     Family History: Family History  Problem Relation Age of Onset  . Congestive Heart Failure Mother   . Heart disease Mother   . Heart disease Father   . Diabetes Brother     Social History   Socioeconomic History  . Marital status: Widowed    Spouse name: Not on file  . Number of children: Not on file  . Years of education: Not on file  . Highest education level: Not on file  Occupational History  . Not on file  Social Needs  . Financial  resource strain: Not on file  . Food insecurity:    Worry: Not on file    Inability: Not on file  . Transportation needs:    Medical: Not on file    Non-medical: Not on file  Tobacco Use  . Smoking status: Never Smoker  . Smokeless tobacco: Never Used  Substance and Sexual Activity  . Alcohol use: No  . Drug use: No  . Sexual activity: Not on file  Lifestyle  . Physical activity:    Days per week: Not on file    Minutes per session: Not on file  . Stress: Not on file  Relationships  . Social connections:    Talks on phone: Not on file    Gets together: Not on file    Attends religious service: Not on file    Active member of club or organization: Not on file    Attends meetings of clubs or organizations: Not on file    Relationship status: Not on file  . Intimate partner violence:    Fear of current or ex partner: Not on file    Emotionally abused: Not on file    Physically abused: Not on file    Forced sexual activity: Not on file  Other Topics Concern  . Not on file  Social History Narrative  . Not on file    Review of Systems  Constitutional: Negative for chills, fatigue and unexpected weight change.  HENT: Negative for congestion, rhinorrhea, sneezing and sore throat.   Eyes: Negative for photophobia, pain, redness and visual disturbance.       Periorbital contusions bilaterally.   Respiratory: Negative for cough, chest tightness and shortness of breath.   Cardiovascular: Negative for chest pain and palpitations.  Gastrointestinal: Negative for abdominal pain, constipation, diarrhea, nausea and vomiting.  Endocrine: Negative.   Genitourinary: Negative for dysuria and frequency.  Musculoskeletal: Negative for arthralgias, back pain, joint swelling and neck pain.  Skin: Negative for rash.  Allergic/Immunologic: Negative.   Neurological: Negative for tremors and numbness.  Hematological: Negative for adenopathy. Does not bruise/bleed easily.   Psychiatric/Behavioral: Negative for behavioral problems and sleep disturbance. The patient is not nervous/anxious.     Vital Signs: BP (!) 152/102   Pulse 95   Temp 98.2 F (36.8 C)   Resp 16   Ht 5\' 2"  (1.575 m)   Wt 160 lb (72.6 kg)   SpO2 93%   BMI 29.26 kg/m    Physical Exam  Constitutional: She is oriented to person, place, and time. She appears well-developed and well-nourished.  No distress.  HENT:  Head: Normocephalic.  Mouth/Throat: Oropharynx is clear and moist. No oropharyngeal exudate.  Bilateral peri-orbital contusions.   Eyes: Pupils are equal, round, and reactive to light. EOM are normal.  Neck: Normal range of motion. Neck supple. No JVD present. No tracheal deviation present. No thyromegaly present.  Cardiovascular: Normal rate, regular rhythm and normal heart sounds. Exam reveals no gallop and no friction rub.  No murmur heard. Pulmonary/Chest: Effort normal and breath sounds normal. No respiratory distress. She has no wheezes. She has no rales. She exhibits no tenderness.  Abdominal: Soft. There is no tenderness. There is no guarding.  Musculoskeletal: Normal range of motion.  Lymphadenopathy:    She has no cervical adenopathy.  Neurological: She is alert and oriented to person, place, and time. No cranial nerve deficit.  Skin: Skin is warm and dry. She is not diaphoretic.  Multiple healing bruises noted to BLE.  Skin tear to right elbow is covered with clean dry dressing.   Psychiatric: She has a normal mood and affect. Her behavior is normal. Judgment and thought content normal.  Nursing note and vitals reviewed.   Assessment/Plan: 1. Fall in home, subsequent encounter No LOC.  Pt doing better at this time.  Continues to ambulate using walker.   2. Facial bruising, subsequent encounter Healing, discussed signs of complications.  3. Generalized weakness Continue to use walker, and/or have assistance with carrying things. Do not walk long distances  alone.   4. Essential hypertension, benign Elevated today.  Pt was rushing to get to appt.  Repeat pressure 160/82  5. Carotid stenosis, left Recently treated by Dr. Lucky Cowboy.  Mild bruising remains at site. Restart Plavix/ aspirin at this time, she is one week post injury.   General Counseling: Kinzi verbalizes understanding of the findings of todays visit and agrees with plan of treatment. I have discussed any further diagnostic evaluation that may be needed or ordered today. We also reviewed her medications today. she has been encouraged to call the office with any questions or concerns that should arise related to todays visit.   No orders of the defined types were placed in this encounter.  I have reviewed all medical records from hospital follow up including radiology reports and consults from other physicians. Appropriate follow up diagnostics will be scheduled as needed. Patient/ Family understands the plan of treatment. Time spent 30 minutes.   Orson Gear AGNP-C Internal Medicine

## 2018-05-24 DIAGNOSIS — I129 Hypertensive chronic kidney disease with stage 1 through stage 4 chronic kidney disease, or unspecified chronic kidney disease: Secondary | ICD-10-CM | POA: Diagnosis not present

## 2018-05-24 DIAGNOSIS — N183 Chronic kidney disease, stage 3 (moderate): Secondary | ICD-10-CM | POA: Diagnosis not present

## 2018-05-24 DIAGNOSIS — D519 Vitamin B12 deficiency anemia, unspecified: Secondary | ICD-10-CM | POA: Diagnosis not present

## 2018-05-24 DIAGNOSIS — G2581 Restless legs syndrome: Secondary | ICD-10-CM | POA: Diagnosis not present

## 2018-05-24 DIAGNOSIS — E1151 Type 2 diabetes mellitus with diabetic peripheral angiopathy without gangrene: Secondary | ICD-10-CM | POA: Diagnosis not present

## 2018-05-24 DIAGNOSIS — E782 Mixed hyperlipidemia: Secondary | ICD-10-CM | POA: Diagnosis not present

## 2018-05-24 DIAGNOSIS — M19012 Primary osteoarthritis, left shoulder: Secondary | ICD-10-CM | POA: Diagnosis not present

## 2018-05-24 DIAGNOSIS — Z7902 Long term (current) use of antithrombotics/antiplatelets: Secondary | ICD-10-CM | POA: Diagnosis not present

## 2018-05-24 DIAGNOSIS — E1122 Type 2 diabetes mellitus with diabetic chronic kidney disease: Secondary | ICD-10-CM | POA: Diagnosis not present

## 2018-05-28 ENCOUNTER — Other Ambulatory Visit: Payer: Self-pay

## 2018-05-28 MED ORDER — BENAZEPRIL HCL 5 MG PO TABS: 5.0000 mg | ORAL_TABLET | Freq: Two times a day (BID) | ORAL | 6 refills | Status: AC

## 2018-05-28 MED ORDER — ALLOPURINOL 100 MG PO TABS: ORAL_TABLET | ORAL | 6 refills | Status: AC

## 2018-05-28 MED ORDER — CLOPIDOGREL BISULFATE 75 MG PO TABS: 75.0000 mg | ORAL_TABLET | Freq: Every day | ORAL | 3 refills | Status: DC

## 2018-05-31 DIAGNOSIS — D519 Vitamin B12 deficiency anemia, unspecified: Secondary | ICD-10-CM | POA: Diagnosis not present

## 2018-05-31 DIAGNOSIS — E1122 Type 2 diabetes mellitus with diabetic chronic kidney disease: Secondary | ICD-10-CM | POA: Diagnosis not present

## 2018-05-31 DIAGNOSIS — I129 Hypertensive chronic kidney disease with stage 1 through stage 4 chronic kidney disease, or unspecified chronic kidney disease: Secondary | ICD-10-CM | POA: Diagnosis not present

## 2018-05-31 DIAGNOSIS — N183 Chronic kidney disease, stage 3 (moderate): Secondary | ICD-10-CM | POA: Diagnosis not present

## 2018-05-31 DIAGNOSIS — E782 Mixed hyperlipidemia: Secondary | ICD-10-CM | POA: Diagnosis not present

## 2018-05-31 DIAGNOSIS — M19012 Primary osteoarthritis, left shoulder: Secondary | ICD-10-CM | POA: Diagnosis not present

## 2018-05-31 DIAGNOSIS — Z7902 Long term (current) use of antithrombotics/antiplatelets: Secondary | ICD-10-CM | POA: Diagnosis not present

## 2018-05-31 DIAGNOSIS — G2581 Restless legs syndrome: Secondary | ICD-10-CM | POA: Diagnosis not present

## 2018-05-31 DIAGNOSIS — E1151 Type 2 diabetes mellitus with diabetic peripheral angiopathy without gangrene: Secondary | ICD-10-CM | POA: Diagnosis not present

## 2018-06-04 ENCOUNTER — Telehealth: Payer: Self-pay | Admitting: Nurse Practitioner

## 2018-06-04 DIAGNOSIS — M19012 Primary osteoarthritis, left shoulder: Secondary | ICD-10-CM | POA: Diagnosis not present

## 2018-06-04 DIAGNOSIS — E1151 Type 2 diabetes mellitus with diabetic peripheral angiopathy without gangrene: Secondary | ICD-10-CM | POA: Diagnosis not present

## 2018-06-04 DIAGNOSIS — I129 Hypertensive chronic kidney disease with stage 1 through stage 4 chronic kidney disease, or unspecified chronic kidney disease: Secondary | ICD-10-CM | POA: Diagnosis not present

## 2018-06-04 DIAGNOSIS — N183 Chronic kidney disease, stage 3 (moderate): Secondary | ICD-10-CM | POA: Diagnosis not present

## 2018-06-04 DIAGNOSIS — E1122 Type 2 diabetes mellitus with diabetic chronic kidney disease: Secondary | ICD-10-CM | POA: Diagnosis not present

## 2018-06-04 DIAGNOSIS — G2581 Restless legs syndrome: Secondary | ICD-10-CM | POA: Diagnosis not present

## 2018-06-04 DIAGNOSIS — E782 Mixed hyperlipidemia: Secondary | ICD-10-CM | POA: Diagnosis not present

## 2018-06-04 DIAGNOSIS — D519 Vitamin B12 deficiency anemia, unspecified: Secondary | ICD-10-CM | POA: Diagnosis not present

## 2018-06-04 DIAGNOSIS — Z7902 Long term (current) use of antithrombotics/antiplatelets: Secondary | ICD-10-CM | POA: Diagnosis not present

## 2018-06-04 NOTE — Telephone Encounter (Signed)
I spoke with Dr. Humphrey Rolls and this is fine. Thanks.

## 2018-06-07 DIAGNOSIS — I129 Hypertensive chronic kidney disease with stage 1 through stage 4 chronic kidney disease, or unspecified chronic kidney disease: Secondary | ICD-10-CM | POA: Diagnosis not present

## 2018-06-07 DIAGNOSIS — E782 Mixed hyperlipidemia: Secondary | ICD-10-CM | POA: Diagnosis not present

## 2018-06-07 DIAGNOSIS — E1122 Type 2 diabetes mellitus with diabetic chronic kidney disease: Secondary | ICD-10-CM | POA: Diagnosis not present

## 2018-06-07 DIAGNOSIS — D519 Vitamin B12 deficiency anemia, unspecified: Secondary | ICD-10-CM | POA: Diagnosis not present

## 2018-06-07 DIAGNOSIS — Z7902 Long term (current) use of antithrombotics/antiplatelets: Secondary | ICD-10-CM | POA: Diagnosis not present

## 2018-06-07 DIAGNOSIS — M19012 Primary osteoarthritis, left shoulder: Secondary | ICD-10-CM | POA: Diagnosis not present

## 2018-06-07 DIAGNOSIS — G2581 Restless legs syndrome: Secondary | ICD-10-CM | POA: Diagnosis not present

## 2018-06-07 DIAGNOSIS — N183 Chronic kidney disease, stage 3 (moderate): Secondary | ICD-10-CM | POA: Diagnosis not present

## 2018-06-07 DIAGNOSIS — E1151 Type 2 diabetes mellitus with diabetic peripheral angiopathy without gangrene: Secondary | ICD-10-CM | POA: Diagnosis not present

## 2018-06-14 DIAGNOSIS — D519 Vitamin B12 deficiency anemia, unspecified: Secondary | ICD-10-CM | POA: Diagnosis not present

## 2018-06-14 DIAGNOSIS — I129 Hypertensive chronic kidney disease with stage 1 through stage 4 chronic kidney disease, or unspecified chronic kidney disease: Secondary | ICD-10-CM | POA: Diagnosis not present

## 2018-06-14 DIAGNOSIS — Z7951 Long term (current) use of inhaled steroids: Secondary | ICD-10-CM | POA: Diagnosis not present

## 2018-06-14 DIAGNOSIS — Z7902 Long term (current) use of antithrombotics/antiplatelets: Secondary | ICD-10-CM | POA: Diagnosis not present

## 2018-06-14 DIAGNOSIS — M19012 Primary osteoarthritis, left shoulder: Secondary | ICD-10-CM | POA: Diagnosis not present

## 2018-06-14 DIAGNOSIS — G2581 Restless legs syndrome: Secondary | ICD-10-CM | POA: Diagnosis not present

## 2018-06-14 DIAGNOSIS — N183 Chronic kidney disease, stage 3 (moderate): Secondary | ICD-10-CM | POA: Diagnosis not present

## 2018-06-14 DIAGNOSIS — E1122 Type 2 diabetes mellitus with diabetic chronic kidney disease: Secondary | ICD-10-CM | POA: Diagnosis not present

## 2018-06-14 DIAGNOSIS — E782 Mixed hyperlipidemia: Secondary | ICD-10-CM | POA: Diagnosis not present

## 2018-06-21 DIAGNOSIS — D519 Vitamin B12 deficiency anemia, unspecified: Secondary | ICD-10-CM | POA: Diagnosis not present

## 2018-06-21 DIAGNOSIS — E782 Mixed hyperlipidemia: Secondary | ICD-10-CM | POA: Diagnosis not present

## 2018-06-21 DIAGNOSIS — M19012 Primary osteoarthritis, left shoulder: Secondary | ICD-10-CM | POA: Diagnosis not present

## 2018-06-21 DIAGNOSIS — I129 Hypertensive chronic kidney disease with stage 1 through stage 4 chronic kidney disease, or unspecified chronic kidney disease: Secondary | ICD-10-CM | POA: Diagnosis not present

## 2018-06-21 DIAGNOSIS — E1122 Type 2 diabetes mellitus with diabetic chronic kidney disease: Secondary | ICD-10-CM | POA: Diagnosis not present

## 2018-06-21 DIAGNOSIS — G2581 Restless legs syndrome: Secondary | ICD-10-CM | POA: Diagnosis not present

## 2018-06-21 DIAGNOSIS — Z7951 Long term (current) use of inhaled steroids: Secondary | ICD-10-CM | POA: Diagnosis not present

## 2018-06-21 DIAGNOSIS — N183 Chronic kidney disease, stage 3 (moderate): Secondary | ICD-10-CM | POA: Diagnosis not present

## 2018-06-21 DIAGNOSIS — Z7902 Long term (current) use of antithrombotics/antiplatelets: Secondary | ICD-10-CM | POA: Diagnosis not present

## 2018-06-28 DIAGNOSIS — D519 Vitamin B12 deficiency anemia, unspecified: Secondary | ICD-10-CM | POA: Diagnosis not present

## 2018-06-28 DIAGNOSIS — I129 Hypertensive chronic kidney disease with stage 1 through stage 4 chronic kidney disease, or unspecified chronic kidney disease: Secondary | ICD-10-CM | POA: Diagnosis not present

## 2018-06-28 DIAGNOSIS — N183 Chronic kidney disease, stage 3 (moderate): Secondary | ICD-10-CM | POA: Diagnosis not present

## 2018-06-28 DIAGNOSIS — Z7951 Long term (current) use of inhaled steroids: Secondary | ICD-10-CM | POA: Diagnosis not present

## 2018-06-28 DIAGNOSIS — E782 Mixed hyperlipidemia: Secondary | ICD-10-CM | POA: Diagnosis not present

## 2018-06-28 DIAGNOSIS — M19012 Primary osteoarthritis, left shoulder: Secondary | ICD-10-CM | POA: Diagnosis not present

## 2018-06-28 DIAGNOSIS — Z7902 Long term (current) use of antithrombotics/antiplatelets: Secondary | ICD-10-CM | POA: Diagnosis not present

## 2018-06-28 DIAGNOSIS — E1122 Type 2 diabetes mellitus with diabetic chronic kidney disease: Secondary | ICD-10-CM | POA: Diagnosis not present

## 2018-06-28 DIAGNOSIS — G2581 Restless legs syndrome: Secondary | ICD-10-CM | POA: Diagnosis not present

## 2018-07-02 ENCOUNTER — Other Ambulatory Visit: Payer: Self-pay

## 2018-07-02 DIAGNOSIS — Z7902 Long term (current) use of antithrombotics/antiplatelets: Secondary | ICD-10-CM | POA: Diagnosis not present

## 2018-07-02 DIAGNOSIS — I129 Hypertensive chronic kidney disease with stage 1 through stage 4 chronic kidney disease, or unspecified chronic kidney disease: Secondary | ICD-10-CM | POA: Diagnosis not present

## 2018-07-02 DIAGNOSIS — E1122 Type 2 diabetes mellitus with diabetic chronic kidney disease: Secondary | ICD-10-CM | POA: Diagnosis not present

## 2018-07-02 DIAGNOSIS — D519 Vitamin B12 deficiency anemia, unspecified: Secondary | ICD-10-CM | POA: Diagnosis not present

## 2018-07-02 DIAGNOSIS — N183 Chronic kidney disease, stage 3 (moderate): Secondary | ICD-10-CM | POA: Diagnosis not present

## 2018-07-02 DIAGNOSIS — Z7951 Long term (current) use of inhaled steroids: Secondary | ICD-10-CM | POA: Diagnosis not present

## 2018-07-02 DIAGNOSIS — E782 Mixed hyperlipidemia: Secondary | ICD-10-CM | POA: Diagnosis not present

## 2018-07-02 DIAGNOSIS — M19012 Primary osteoarthritis, left shoulder: Secondary | ICD-10-CM | POA: Diagnosis not present

## 2018-07-02 DIAGNOSIS — G2581 Restless legs syndrome: Secondary | ICD-10-CM | POA: Diagnosis not present

## 2018-07-02 MED ORDER — CYANOCOBALAMIN 1000 MCG/ML IJ SOLN: INTRAMUSCULAR | 0 refills | Status: DC

## 2018-07-05 DIAGNOSIS — I129 Hypertensive chronic kidney disease with stage 1 through stage 4 chronic kidney disease, or unspecified chronic kidney disease: Secondary | ICD-10-CM | POA: Diagnosis not present

## 2018-07-05 DIAGNOSIS — Z7951 Long term (current) use of inhaled steroids: Secondary | ICD-10-CM | POA: Diagnosis not present

## 2018-07-05 DIAGNOSIS — D519 Vitamin B12 deficiency anemia, unspecified: Secondary | ICD-10-CM | POA: Diagnosis not present

## 2018-07-05 DIAGNOSIS — N183 Chronic kidney disease, stage 3 (moderate): Secondary | ICD-10-CM | POA: Diagnosis not present

## 2018-07-05 DIAGNOSIS — E782 Mixed hyperlipidemia: Secondary | ICD-10-CM | POA: Diagnosis not present

## 2018-07-05 DIAGNOSIS — Z7902 Long term (current) use of antithrombotics/antiplatelets: Secondary | ICD-10-CM | POA: Diagnosis not present

## 2018-07-05 DIAGNOSIS — M19012 Primary osteoarthritis, left shoulder: Secondary | ICD-10-CM | POA: Diagnosis not present

## 2018-07-05 DIAGNOSIS — G2581 Restless legs syndrome: Secondary | ICD-10-CM | POA: Diagnosis not present

## 2018-07-05 DIAGNOSIS — E1122 Type 2 diabetes mellitus with diabetic chronic kidney disease: Secondary | ICD-10-CM | POA: Diagnosis not present

## 2018-07-09 DIAGNOSIS — M19012 Primary osteoarthritis, left shoulder: Secondary | ICD-10-CM | POA: Diagnosis not present

## 2018-07-09 DIAGNOSIS — E1122 Type 2 diabetes mellitus with diabetic chronic kidney disease: Secondary | ICD-10-CM | POA: Diagnosis not present

## 2018-07-09 DIAGNOSIS — D519 Vitamin B12 deficiency anemia, unspecified: Secondary | ICD-10-CM | POA: Diagnosis not present

## 2018-07-09 DIAGNOSIS — I129 Hypertensive chronic kidney disease with stage 1 through stage 4 chronic kidney disease, or unspecified chronic kidney disease: Secondary | ICD-10-CM | POA: Diagnosis not present

## 2018-07-09 DIAGNOSIS — Z7951 Long term (current) use of inhaled steroids: Secondary | ICD-10-CM | POA: Diagnosis not present

## 2018-07-09 DIAGNOSIS — N183 Chronic kidney disease, stage 3 (moderate): Secondary | ICD-10-CM | POA: Diagnosis not present

## 2018-07-09 DIAGNOSIS — Z7902 Long term (current) use of antithrombotics/antiplatelets: Secondary | ICD-10-CM | POA: Diagnosis not present

## 2018-07-09 DIAGNOSIS — E782 Mixed hyperlipidemia: Secondary | ICD-10-CM | POA: Diagnosis not present

## 2018-07-09 DIAGNOSIS — G2581 Restless legs syndrome: Secondary | ICD-10-CM | POA: Diagnosis not present

## 2018-07-15 ENCOUNTER — Other Ambulatory Visit: Payer: Self-pay | Admitting: Nurse Practitioner

## 2018-07-15 ENCOUNTER — Other Ambulatory Visit: Payer: Self-pay

## 2018-07-15 MED ORDER — ALPRAZOLAM 0.25 MG PO TABS: 0.2500 mg | ORAL_TABLET | Freq: Every evening | ORAL | 0 refills | Status: DC | PRN

## 2018-07-15 MED ORDER — CYANOCOBALAMIN 1000 MCG/ML IJ SOLN: INTRAMUSCULAR | 0 refills | Status: DC

## 2018-07-16 DIAGNOSIS — I129 Hypertensive chronic kidney disease with stage 1 through stage 4 chronic kidney disease, or unspecified chronic kidney disease: Secondary | ICD-10-CM | POA: Diagnosis not present

## 2018-07-16 DIAGNOSIS — Z7902 Long term (current) use of antithrombotics/antiplatelets: Secondary | ICD-10-CM | POA: Diagnosis not present

## 2018-07-16 DIAGNOSIS — N183 Chronic kidney disease, stage 3 (moderate): Secondary | ICD-10-CM | POA: Diagnosis not present

## 2018-07-16 DIAGNOSIS — E782 Mixed hyperlipidemia: Secondary | ICD-10-CM | POA: Diagnosis not present

## 2018-07-16 DIAGNOSIS — G2581 Restless legs syndrome: Secondary | ICD-10-CM | POA: Diagnosis not present

## 2018-07-16 DIAGNOSIS — R319 Hematuria, unspecified: Secondary | ICD-10-CM | POA: Diagnosis not present

## 2018-07-16 DIAGNOSIS — M19012 Primary osteoarthritis, left shoulder: Secondary | ICD-10-CM | POA: Diagnosis not present

## 2018-07-16 DIAGNOSIS — Z7951 Long term (current) use of inhaled steroids: Secondary | ICD-10-CM | POA: Diagnosis not present

## 2018-07-16 DIAGNOSIS — E1122 Type 2 diabetes mellitus with diabetic chronic kidney disease: Secondary | ICD-10-CM | POA: Diagnosis not present

## 2018-07-16 DIAGNOSIS — N390 Urinary tract infection, site not specified: Secondary | ICD-10-CM | POA: Diagnosis not present

## 2018-07-16 DIAGNOSIS — D519 Vitamin B12 deficiency anemia, unspecified: Secondary | ICD-10-CM | POA: Diagnosis not present

## 2018-07-19 DIAGNOSIS — Z7951 Long term (current) use of inhaled steroids: Secondary | ICD-10-CM | POA: Diagnosis not present

## 2018-07-19 DIAGNOSIS — I129 Hypertensive chronic kidney disease with stage 1 through stage 4 chronic kidney disease, or unspecified chronic kidney disease: Secondary | ICD-10-CM | POA: Diagnosis not present

## 2018-07-19 DIAGNOSIS — M19012 Primary osteoarthritis, left shoulder: Secondary | ICD-10-CM | POA: Diagnosis not present

## 2018-07-19 DIAGNOSIS — E782 Mixed hyperlipidemia: Secondary | ICD-10-CM | POA: Diagnosis not present

## 2018-07-19 DIAGNOSIS — G2581 Restless legs syndrome: Secondary | ICD-10-CM | POA: Diagnosis not present

## 2018-07-19 DIAGNOSIS — N183 Chronic kidney disease, stage 3 (moderate): Secondary | ICD-10-CM | POA: Diagnosis not present

## 2018-07-19 DIAGNOSIS — Z7902 Long term (current) use of antithrombotics/antiplatelets: Secondary | ICD-10-CM | POA: Diagnosis not present

## 2018-07-19 DIAGNOSIS — E1122 Type 2 diabetes mellitus with diabetic chronic kidney disease: Secondary | ICD-10-CM | POA: Diagnosis not present

## 2018-07-19 DIAGNOSIS — D519 Vitamin B12 deficiency anemia, unspecified: Secondary | ICD-10-CM | POA: Diagnosis not present

## 2018-07-23 ENCOUNTER — Ambulatory Visit (INDEPENDENT_AMBULATORY_CARE_PROVIDER_SITE_OTHER): Payer: Medicare HMO | Admitting: Nurse Practitioner

## 2018-07-23 ENCOUNTER — Encounter: Payer: Self-pay | Admitting: Nurse Practitioner

## 2018-07-23 VITALS — BP 180/79 | HR 100 | Resp 16 | Ht 62.0 in | Wt 159.0 lb

## 2018-07-23 DIAGNOSIS — M150 Primary generalized (osteo)arthritis: Secondary | ICD-10-CM | POA: Diagnosis not present

## 2018-07-23 DIAGNOSIS — R319 Hematuria, unspecified: Secondary | ICD-10-CM | POA: Diagnosis not present

## 2018-07-23 DIAGNOSIS — N390 Urinary tract infection, site not specified: Secondary | ICD-10-CM

## 2018-07-23 DIAGNOSIS — H1013 Acute atopic conjunctivitis, bilateral: Secondary | ICD-10-CM

## 2018-07-23 DIAGNOSIS — Z79899 Other long term (current) drug therapy: Secondary | ICD-10-CM | POA: Diagnosis not present

## 2018-07-23 DIAGNOSIS — R300 Dysuria: Secondary | ICD-10-CM | POA: Diagnosis not present

## 2018-07-23 DIAGNOSIS — F5101 Primary insomnia: Secondary | ICD-10-CM

## 2018-07-23 DIAGNOSIS — Z0001 Encounter for general adult medical examination with abnormal findings: Secondary | ICD-10-CM | POA: Insufficient documentation

## 2018-07-23 LAB — POCT URINE DRUG SCREEN
POC AMPHETAMINE UR: NOT DETECTED
POC BENZODIAZEPINES UR: POSITIVE — AB
POC Barbiturate UR: NOT DETECTED
POC Cocaine UR: NOT DETECTED
POC Ecstasy UR: NOT DETECTED
POC MARIJUANA UR: NOT DETECTED
POC METHAMPHETAMINE UR: NOT DETECTED
POC Methadone UR: NOT DETECTED
POC OPIATE UR: NOT DETECTED
POC OXYCODONE UR: NOT DETECTED
POC PHENCYCLIDINE UR: NOT DETECTED
POC TRICYCLICS UR: NOT DETECTED

## 2018-07-23 LAB — POCT URINALYSIS DIPSTICK
Bilirubin, UA: NEGATIVE
Glucose, UA: NEGATIVE
Ketones, UA: NEGATIVE
PH UA: 5 (ref 5.0–8.0)
PROTEIN UA: NEGATIVE
Spec Grav, UA: 1.01 (ref 1.010–1.025)
UROBILINOGEN UA: 0.2 U/dL

## 2018-07-23 MED ORDER — ALPRAZOLAM 0.25 MG PO TABS: 0.2500 mg | ORAL_TABLET | Freq: Every evening | ORAL | 3 refills | Status: AC | PRN

## 2018-07-23 MED ORDER — OLOPATADINE HCL 0.2 % OP SOLN: 1.0000 [drp] | Freq: Every day | OPHTHALMIC | 5 refills | Status: AC

## 2018-07-23 MED ORDER — CEPHALEXIN 250 MG PO CAPS: 250.0000 mg | ORAL_CAPSULE | Freq: Three times a day (TID) | ORAL | 0 refills | Status: DC

## 2018-07-23 MED ORDER — ALPRAZOLAM 0.25 MG PO TABS: 0.2500 mg | ORAL_TABLET | Freq: Every evening | ORAL | 3 refills | Status: DC | PRN

## 2018-07-23 MED ORDER — DICLOFENAC SODIUM 1 % TD GEL: 4.0000 g | Freq: Four times a day (QID) | TRANSDERMAL | 12 refills | Status: DC

## 2018-07-23 NOTE — Progress Notes (Signed)
[facility] [place], [place] [address]  Internal MEDICINE  Office Visit Note  Patient Name: [name]  [id number]  [id number]  Date of Service: 07/23/2018  Chief Complaint  Patient presents with  . Hypertension  . Hyperlipidemia  . Urinary Tract Infection  . Ear Pain    The patient is here for follow up visit. She states that she has pain in both ear for several days. Has been using over the counter earache drops as well as ear wax removal which has not helped the discomfort in her ears. She is also having pain and burning when she urinates. Has been going on for several days. Continues to feel weak in both of her legs, especially the right leg. Has had a hip replacement and hip joint is starting to deteriorate due to arthritis. Is told that there is nothing that can be done for this. She continues to do physical therapy exercises at home as best she can.       Current Medication: Outpatient Encounter Medications as of 07/23/2018  Medication Sig Note  . allopurinol (ZYLOPRIM) 100 MG tablet take 2 tablets by mouth at bedtime for gout   . ALPRAZolam (XANAX) 0.25 MG tablet Take 1 tablet (0.25 mg total) by mouth at bedtime as needed for anxiety.   Marland Kitchen amLODipine (NORVASC) 2.5 MG tablet take 1 tablet by mouth at lunch   . aspirin EC 81 MG EC tablet Take 1 tablet (81 mg total) by mouth daily.   . benazepril (LOTENSIN) 5 MG tablet Take 1 tablet (5 mg total) by mouth 2 (two) times daily.   . Cholecalciferol (VITAMIN D3) 2000 units capsule Take 2,000 Units by mouth daily.    . cloNIDine (CATAPRES - DOSED IN MG/24 HR) 0.2 mg/24hr patch apply 1 patch every week 03/17/2018: Every Saturday  . clopidogrel (PLAVIX) 75 MG tablet Take 1 tablet (75 mg total) by mouth daily with breakfast.   . conjugated estrogens (PREMARIN) vaginal cream Place 1 Applicatorful vaginally once a week. At night.   . cyanocobalamin (,VITAMIN B-12,) 1000 MCG/ML injection INJECT 1 MILLILITER  INTRAMUSCULARLY ONCE A MONTH   . diclofenac sodium (VOLTAREN) 1 % GEL Apply 4 g topically 4 (four) times daily.   . FEROSUL 325 (65 Fe) MG tablet take 1 tablet by mouth twice a day   . fluticasone (FLONASE) 50 MCG/ACT nasal spray Place 1 spray into both nostrils daily as needed for allergies.    . Multiple Vitamins-Minerals (CENTRUM SILVER 50+WOMEN) TABS Take 1 tablet by mouth daily.   Marland Kitchen omeprazole (PRILOSEC) 20 MG capsule take 1 capsule by mouth once daily FOR BELCHING AND STOMACH.   . pravastatin (PRAVACHOL) 20 MG tablet take 1 tablet by mouth once daily   . triamcinolone (KENALOG) 0.025 % cream Apply 1 application topically 2 (two) times daily.   . [DISCONTINUED] ALPRAZolam (XANAX) 0.25 MG tablet Take 1 tablet (0.25 mg total) by mouth at bedtime as needed for anxiety.   . [DISCONTINUED] ALPRAZolam (XANAX) 0.25 MG tablet Take 1 tablet (0.25 mg total) by mouth at bedtime as needed for anxiety.   . [DISCONTINUED] VOLTAREN 1 % GEL apply 2 grams to affected area twice a day   . cephALEXin (KEFLEX) 250 MG capsule Take 1 capsule (250 mg total) by mouth 3 (three) times daily.   . Olopatadine HCl 0.2 % SOLN Apply 1 drop to eye daily.    No facility-administered encounter medications on file as of 07/23/2018.  Surgical History: Past Surgical History:  Procedure Laterality Date  . APPENDECTOMY    . BACK SURGERY    . CAROTID ENDARTERECTOMY Right   . CHOLECYSTECTOMY    . ENDARTERECTOMY Left 03/25/2018   Procedure: ENDARTERECTOMY CAROTID;  Surgeon: Algernon Huxley, MD;  Location: [facility] ORS;  Service: Vascular;  Laterality: Left;  . EYE SURGERY Bilateral   . FOOT SURGERY Left   . HERNIA REPAIR    . HIP SURGERY Left   . TONSILLECTOMY      Medical History: Past Medical History:  Diagnosis Date  . Allergy   . Arthritis   . CKD (chronic kidney disease), stage III ([place])   . GERD (gastroesophageal reflux disease)   . Hyperlipidemia   . Hypertension   . Insomnia   . Stroke [facility])     Family  History: Family History  Problem Relation Age of Onset  . Congestive Heart Failure Mother   . Heart disease Mother   . Heart disease Father   . Diabetes Brother     Social History   Socioeconomic History  . Marital status: Widowed    Spouse name: Not on file  . Number of children: Not on file  . Years of education: Not on file  . Highest education level: Not on file  Occupational History  . Not on file  Social Needs  . Financial resource strain: Not on file  . Food insecurity:    Worry: Not on file    Inability: Not on file  . Transportation needs:    Medical: Not on file    Non-medical: Not on file  Tobacco Use  . Smoking status: Never Smoker  . Smokeless tobacco: Never Used  Substance and Sexual Activity  . Alcohol use: No  . Drug use: No  . Sexual activity: Not on file  Lifestyle  . Physical activity:    Days per week: Not on file    Minutes per session: Not on file  . Stress: Not on file  Relationships  . Social connections:    Talks on phone: Not on file    Gets together: Not on file    Attends religious service: Not on file    Active member of club or organization: Not on file    Attends meetings of clubs or organizations: Not on file    Relationship status: Not on file  . Intimate partner violence:    Fear of current or ex partner: Not on file    Emotionally abused: Not on file    Physically abused: Not on file    Forced sexual activity: Not on file  Other Topics Concern  . Not on file  Social History Narrative  . Not on file      Review of Systems  Constitutional: Positive for fatigue. Negative for chills and unexpected weight change.  HENT: Positive for ear pain. Negative for congestion, postnasal drip, rhinorrhea, sneezing and sore throat.   Eyes: Positive for itching. Negative for redness.  Respiratory: Negative for cough, chest tightness and shortness of breath.   Cardiovascular: Negative for chest pain and palpitations.  Gastrointestinal:  Negative for abdominal pain, constipation, diarrhea, nausea and vomiting.  Endocrine: Negative for cold intolerance, heat intolerance and polyuria.  Genitourinary: Positive for dysuria and urgency. Negative for frequency.  Musculoskeletal: Positive for gait problem. Negative for arthralgias, back pain, joint swelling and neck pain.  Skin: Negative for rash.  Allergic/Immunologic: Positive for environmental allergies.  Neurological: Positive for weakness. Negative for  tremors and numbness.  Hematological: Negative for adenopathy. Does not bruise/bleed easily.  Psychiatric/Behavioral: Positive for sleep disturbance. Negative for behavioral problems (Depression) and suicidal ideas. The patient is nervous/anxious.     Today's Vitals   07/23/18 0939  BP: (!) 180/79  Pulse: 100  Resp: 16  SpO2: 96%  Weight: 159 lb (72.1 kg)  Height: 5\' 2"  (1.575 m)    Physical Exam  Constitutional: She is oriented to person, place, and time. She appears well-developed and well-nourished. No distress.  HENT:  Head: Normocephalic and atraumatic.  Right Ear: External ear normal.  Left Ear: External ear normal.  Nose: Nose normal.  Mouth/Throat: No oropharyngeal exudate.  Eyes: Pupils are equal, round, and reactive to light. EOM are normal.  Neck: Normal range of motion. Neck supple. No JVD present. No tracheal deviation present. No thyromegaly present.  Cardiovascular: Normal rate and regular rhythm. Exam reveals no gallop and no friction rub.  Murmur heard. Pulmonary/Chest: Effort normal and breath sounds normal. No respiratory distress. She has no wheezes. She has no rales. She exhibits no tenderness.  Abdominal: Soft. Bowel sounds are normal. There is no tenderness.  Genitourinary:  Genitourinary Comments: Urine sample is positive for trace blood and trace WBC  Musculoskeletal: Normal range of motion.  Lymphadenopathy:    She has no cervical adenopathy.  Neurological: She is alert and oriented to  person, place, and time. No cranial nerve deficit.  Skin: Skin is warm and dry. She is not diaphoretic.  Psychiatric: She has a normal mood and affect. Her behavior is normal. Judgment and thought content normal.  Nursing note and vitals reviewed.  Assessment/Plan: 1. Urinary tract infection with hematuria, site unspecified Start cephalexin 250mg  tid for 7 days. Send urine for culture and sensitivity and adjust as indicated.  - CULTURE, URINE COMPREHENSIVE  2. Allergic conjunctivitis of both eyes Add pataday eye drops - 1 drop in both eyes daily. - Olopatadine HCl 0.2 % SOLN; Apply 1 drop to eye daily.  Dispense: 2.5 mL; Refill: 5  3. Primary generalized (osteo)arthritis Continue to use voltaren gel up to four times daily when needed to reduce pain - diclofenac sodium (VOLTAREN) 1 % GEL; Apply 4 g topically 4 (four) times daily.  Dispense: 200 g; Refill: 12  4. Primary insomnia May continue to take alprazolam 0.25mg  at bedtime as needed. New prescription sent to pharmacy.  - ALPRAZolam Duanne Moron) 0.25 MG tablet; Take 1 tablet (0.25 mg total) by mouth at bedtime as needed for anxiety.  Dispense: 30 tablet; Refill: 3  5. Encounter for long-term (current) use of medications - POCT Urine Drug Screen appropriately positive for bzo only   6. Dysuria - POCT Urinalysis Dipstick   General Counseling: Velva verbalizes understanding of the findings of todays visit and agrees with plan of treatment. I have discussed any further diagnostic evaluation that may be needed or ordered today. We also reviewed her medications today. she has been encouraged to call the office with any questions or concerns that should arise related to todays visit.  Reviewed risks and possible side effects associated with taking opiates, benzodiazepines and other CNS depressants. Combination of these could cause dizziness and drowsiness. Advised patient not to drive or operate machinery when taking these medications, as  patient's and other's life can be at risk and will have consequences. Patient verbalized understanding in this matter. Dependence and abuse for these drugs will be monitored closely. A Controlled substance policy and procedure is on file which allows [facility] medical  associates to order a urine drug screen test at any visit. Patient understands and agrees with the plan  This patient was seen by Leretha Pol FNP Collaboration with Dr Lavera Guise as a part of collaborative care agreement  Orders Placed This Encounter  Procedures  . CULTURE, URINE COMPREHENSIVE  . POCT Urinalysis Dipstick  . POCT Urine Drug Screen    Meds ordered this encounter  Medications  . DISCONTD: ALPRAZolam (XANAX) 0.25 MG tablet    Sig: Take 1 tablet (0.25 mg total) by mouth at bedtime as needed for anxiety.    Dispense:  30 tablet    Refill:  3    Order Specific Question:   Supervising Provider    Answer:   Lavera Guise [[id number]]  . diclofenac sodium (VOLTAREN) 1 % GEL    Sig: Apply 4 g topically 4 (four) times daily.    Dispense:  200 g    Refill:  12    Order Specific Question:   Supervising Provider    Answer:   Lavera Guise [[id number]]  . Olopatadine HCl 0.2 % SOLN    Sig: Apply 1 drop to eye daily.    Dispense:  2.5 mL    Refill:  5    Order Specific Question:   Supervising Provider    Answer:   Lavera Guise [[id number]]  . cephALEXin (KEFLEX) 250 MG capsule    Sig: Take 1 capsule (250 mg total) by mouth 3 (three) times daily.    Dispense:  21 capsule    Refill:  0    Order Specific Question:   Supervising Provider    Answer:   Lavera Guise [[id number]]  . ALPRAZolam (XANAX) 0.25 MG tablet    Sig: Take 1 tablet (0.25 mg total) by mouth at bedtime as needed for anxiety.    Dispense:  30 tablet    Refill:  3    Order Specific Question:   Supervising Provider    Answer:   Lavera Guise [[id number]]    Time spent: 68 Minutes      Dr Lavera Guise Internal medicine

## 2018-07-28 LAB — CULTURE, URINE COMPREHENSIVE

## 2018-07-29 DIAGNOSIS — G2581 Restless legs syndrome: Secondary | ICD-10-CM | POA: Diagnosis not present

## 2018-07-29 DIAGNOSIS — E782 Mixed hyperlipidemia: Secondary | ICD-10-CM | POA: Diagnosis not present

## 2018-07-29 DIAGNOSIS — D519 Vitamin B12 deficiency anemia, unspecified: Secondary | ICD-10-CM | POA: Diagnosis not present

## 2018-07-29 DIAGNOSIS — E1122 Type 2 diabetes mellitus with diabetic chronic kidney disease: Secondary | ICD-10-CM | POA: Diagnosis not present

## 2018-07-29 DIAGNOSIS — I129 Hypertensive chronic kidney disease with stage 1 through stage 4 chronic kidney disease, or unspecified chronic kidney disease: Secondary | ICD-10-CM | POA: Diagnosis not present

## 2018-07-29 DIAGNOSIS — N183 Chronic kidney disease, stage 3 (moderate): Secondary | ICD-10-CM | POA: Diagnosis not present

## 2018-07-29 DIAGNOSIS — Z7902 Long term (current) use of antithrombotics/antiplatelets: Secondary | ICD-10-CM | POA: Diagnosis not present

## 2018-07-29 DIAGNOSIS — Z7951 Long term (current) use of inhaled steroids: Secondary | ICD-10-CM | POA: Diagnosis not present

## 2018-07-29 DIAGNOSIS — M19012 Primary osteoarthritis, left shoulder: Secondary | ICD-10-CM | POA: Diagnosis not present

## 2018-08-01 DIAGNOSIS — E782 Mixed hyperlipidemia: Secondary | ICD-10-CM | POA: Diagnosis not present

## 2018-08-01 DIAGNOSIS — G2581 Restless legs syndrome: Secondary | ICD-10-CM | POA: Diagnosis not present

## 2018-08-01 DIAGNOSIS — I129 Hypertensive chronic kidney disease with stage 1 through stage 4 chronic kidney disease, or unspecified chronic kidney disease: Secondary | ICD-10-CM | POA: Diagnosis not present

## 2018-08-01 DIAGNOSIS — D519 Vitamin B12 deficiency anemia, unspecified: Secondary | ICD-10-CM | POA: Diagnosis not present

## 2018-08-01 DIAGNOSIS — Z7902 Long term (current) use of antithrombotics/antiplatelets: Secondary | ICD-10-CM | POA: Diagnosis not present

## 2018-08-01 DIAGNOSIS — M19012 Primary osteoarthritis, left shoulder: Secondary | ICD-10-CM | POA: Diagnosis not present

## 2018-08-01 DIAGNOSIS — N183 Chronic kidney disease, stage 3 (moderate): Secondary | ICD-10-CM | POA: Diagnosis not present

## 2018-08-01 DIAGNOSIS — E1122 Type 2 diabetes mellitus with diabetic chronic kidney disease: Secondary | ICD-10-CM | POA: Diagnosis not present

## 2018-08-01 DIAGNOSIS — Z7951 Long term (current) use of inhaled steroids: Secondary | ICD-10-CM | POA: Diagnosis not present

## 2018-08-09 DIAGNOSIS — E782 Mixed hyperlipidemia: Secondary | ICD-10-CM | POA: Diagnosis not present

## 2018-08-09 DIAGNOSIS — I129 Hypertensive chronic kidney disease with stage 1 through stage 4 chronic kidney disease, or unspecified chronic kidney disease: Secondary | ICD-10-CM | POA: Diagnosis not present

## 2018-08-09 DIAGNOSIS — G2581 Restless legs syndrome: Secondary | ICD-10-CM | POA: Diagnosis not present

## 2018-08-09 DIAGNOSIS — D519 Vitamin B12 deficiency anemia, unspecified: Secondary | ICD-10-CM | POA: Diagnosis not present

## 2018-08-09 DIAGNOSIS — E1122 Type 2 diabetes mellitus with diabetic chronic kidney disease: Secondary | ICD-10-CM | POA: Diagnosis not present

## 2018-08-09 DIAGNOSIS — N183 Chronic kidney disease, stage 3 (moderate): Secondary | ICD-10-CM | POA: Diagnosis not present

## 2018-08-09 DIAGNOSIS — Z7951 Long term (current) use of inhaled steroids: Secondary | ICD-10-CM | POA: Diagnosis not present

## 2018-08-09 DIAGNOSIS — Z7902 Long term (current) use of antithrombotics/antiplatelets: Secondary | ICD-10-CM | POA: Diagnosis not present

## 2018-08-09 DIAGNOSIS — M19012 Primary osteoarthritis, left shoulder: Secondary | ICD-10-CM | POA: Diagnosis not present

## 2018-08-16 DIAGNOSIS — N183 Chronic kidney disease, stage 3 (moderate): Secondary | ICD-10-CM | POA: Diagnosis not present

## 2018-08-16 DIAGNOSIS — Z7951 Long term (current) use of inhaled steroids: Secondary | ICD-10-CM | POA: Diagnosis not present

## 2018-08-16 DIAGNOSIS — M19012 Primary osteoarthritis, left shoulder: Secondary | ICD-10-CM | POA: Diagnosis not present

## 2018-08-16 DIAGNOSIS — Z7902 Long term (current) use of antithrombotics/antiplatelets: Secondary | ICD-10-CM | POA: Diagnosis not present

## 2018-08-16 DIAGNOSIS — E1122 Type 2 diabetes mellitus with diabetic chronic kidney disease: Secondary | ICD-10-CM | POA: Diagnosis not present

## 2018-08-16 DIAGNOSIS — G2581 Restless legs syndrome: Secondary | ICD-10-CM | POA: Diagnosis not present

## 2018-08-16 DIAGNOSIS — D519 Vitamin B12 deficiency anemia, unspecified: Secondary | ICD-10-CM | POA: Diagnosis not present

## 2018-08-16 DIAGNOSIS — I129 Hypertensive chronic kidney disease with stage 1 through stage 4 chronic kidney disease, or unspecified chronic kidney disease: Secondary | ICD-10-CM | POA: Diagnosis not present

## 2018-08-16 DIAGNOSIS — E782 Mixed hyperlipidemia: Secondary | ICD-10-CM | POA: Diagnosis not present

## 2018-08-19 DIAGNOSIS — E1122 Type 2 diabetes mellitus with diabetic chronic kidney disease: Secondary | ICD-10-CM | POA: Diagnosis not present

## 2018-08-19 DIAGNOSIS — I129 Hypertensive chronic kidney disease with stage 1 through stage 4 chronic kidney disease, or unspecified chronic kidney disease: Secondary | ICD-10-CM | POA: Diagnosis not present

## 2018-08-19 DIAGNOSIS — Z7902 Long term (current) use of antithrombotics/antiplatelets: Secondary | ICD-10-CM | POA: Diagnosis not present

## 2018-08-19 DIAGNOSIS — D519 Vitamin B12 deficiency anemia, unspecified: Secondary | ICD-10-CM | POA: Diagnosis not present

## 2018-08-19 DIAGNOSIS — G2581 Restless legs syndrome: Secondary | ICD-10-CM | POA: Diagnosis not present

## 2018-08-19 DIAGNOSIS — N183 Chronic kidney disease, stage 3 (moderate): Secondary | ICD-10-CM | POA: Diagnosis not present

## 2018-08-19 DIAGNOSIS — Z7951 Long term (current) use of inhaled steroids: Secondary | ICD-10-CM | POA: Diagnosis not present

## 2018-08-19 DIAGNOSIS — E782 Mixed hyperlipidemia: Secondary | ICD-10-CM | POA: Diagnosis not present

## 2018-08-19 DIAGNOSIS — M19012 Primary osteoarthritis, left shoulder: Secondary | ICD-10-CM | POA: Diagnosis not present

## 2018-08-20 ENCOUNTER — Ambulatory Visit (INDEPENDENT_AMBULATORY_CARE_PROVIDER_SITE_OTHER): Payer: Medicare HMO

## 2018-08-20 ENCOUNTER — Encounter (INDEPENDENT_AMBULATORY_CARE_PROVIDER_SITE_OTHER): Payer: Self-pay | Admitting: Vascular Surgery

## 2018-08-20 ENCOUNTER — Ambulatory Visit (INDEPENDENT_AMBULATORY_CARE_PROVIDER_SITE_OTHER): Payer: Medicare HMO | Admitting: Vascular Surgery

## 2018-08-20 VITALS — BP 160/76 | HR 93 | Resp 18 | Ht 62.0 in | Wt 161.0 lb

## 2018-08-20 DIAGNOSIS — E785 Hyperlipidemia, unspecified: Secondary | ICD-10-CM

## 2018-08-20 DIAGNOSIS — I6523 Occlusion and stenosis of bilateral carotid arteries: Secondary | ICD-10-CM | POA: Diagnosis not present

## 2018-08-20 DIAGNOSIS — I10 Essential (primary) hypertension: Secondary | ICD-10-CM | POA: Diagnosis not present

## 2018-08-20 NOTE — Progress Notes (Signed)
MRN : [id number]  [name] is a 85 y.o. (1927-08-04) female who presents with chief complaint of  Chief Complaint  Patient presents with  . Follow-up  .  History of Present Illness: Patient returns in follow-up of her carotid disease.  She is about 4 to 5 months status post left carotid endarterectomy for high-grade symptomatic stenosis.  She had her right carotid endarterectomy for many years ago.  She is doing well.  She did have a fall with significant bruising several weeks ago but she has healed from that.  No focal neurologic symptoms. Specifically, the patient denies amaurosis fugax, speech or swallowing difficulties, or arm or leg weakness or numbness.  Her duplex today shows mildly elevated velocities in the left carotid artery after endarterectomy but nothing worrisome.  The right carotid endarterectomy velocities are normal.  Current Outpatient Medications  Medication Sig Dispense Refill  . allopurinol (ZYLOPRIM) 100 MG tablet take 2 tablets by mouth at bedtime for gout 60 tablet 6  . ALPRAZolam (XANAX) 0.25 MG tablet Take 1 tablet (0.25 mg total) by mouth at bedtime as needed for anxiety. 30 tablet 3  . amLODipine (NORVASC) 2.5 MG tablet take 1 tablet by mouth at lunch 30 tablet 6  . aspirin EC 81 MG EC tablet Take 1 tablet (81 mg total) by mouth daily. 30 tablet 5  . benazepril (LOTENSIN) 5 MG tablet Take 1 tablet (5 mg total) by mouth 2 (two) times daily. 60 tablet 6  . Cholecalciferol (VITAMIN D3) 2000 units capsule Take 2,000 Units by mouth daily.     . clopidogrel (PLAVIX) 75 MG tablet Take 1 tablet (75 mg total) by mouth daily with breakfast. 30 tablet 3  . conjugated estrogens (PREMARIN) vaginal cream Place 1 Applicatorful vaginally once a week. At night.    . cyanocobalamin (,VITAMIN B-12,) 1000 MCG/ML injection INJECT 1 MILLILITER INTRAMUSCULARLY ONCE A MONTH 1 mL 0  . estrogens, conjugated, (PREMARIN) 0.625 MG tablet Take 0.625 mg by mouth daily. Take daily for  21 days then do not take for 7 days.    . FEROSUL 325 (65 Fe) MG tablet take 1 tablet by mouth twice a day 60 tablet 5  . fluticasone (FLONASE) 50 MCG/ACT nasal spray Place 1 spray into both nostrils daily as needed for allergies.     . Multiple Vitamins-Minerals (CENTRUM SILVER 50+WOMEN) TABS Take 1 tablet by mouth daily.    . naproxen (NAPROSYN) 250 MG tablet Take by mouth 2 (two) times daily with a meal.    . Olopatadine HCl 0.2 % SOLN Apply 1 drop to eye daily. 2.5 mL 5  . omeprazole (PRILOSEC) 20 MG capsule take 1 capsule by mouth once daily FOR BELCHING AND STOMACH. 30 capsule 5  . pravastatin (PRAVACHOL) 20 MG tablet take 1 tablet by mouth once daily 30 tablet 6  . cloNIDine (CATAPRES - DOSED IN MG/24 HR) 0.2 mg/24hr patch apply 1 patch every week (Patient not taking: Reported on 08/20/2018) 4 patch 5  . triamcinolone (KENALOG) 0.025 % cream Apply 1 application topically 2 (two) times daily. (Patient not taking: Reported on 08/20/2018) 80 g 2   No current facility-administered medications for this visit.     Past Medical History:  Diagnosis Date  . Allergy   . Arthritis   . CKD (chronic kidney disease), stage III ([place])   . GERD (gastroesophageal reflux disease)   . Hyperlipidemia   . Hypertension   . Insomnia   . Stroke [facility])  Past Surgical History:  Procedure Laterality Date  . APPENDECTOMY    . BACK SURGERY    . CAROTID ENDARTERECTOMY Right   . CHOLECYSTECTOMY    . ENDARTERECTOMY Left 03/25/2018   Procedure: ENDARTERECTOMY CAROTID;  Surgeon: Algernon Huxley, MD;  Location: [facility] ORS;  Service: Vascular;  Laterality: Left;  . EYE SURGERY Bilateral   . FOOT SURGERY Left   . HERNIA REPAIR    . HIP SURGERY Left   . TONSILLECTOMY      Social History  Substance Use Topics  . Smoking status: Never Smoker  . Smokeless tobacco: Never Used  . Alcohol use No     Family History      Family History  Problem Relation Age of Onset  . Congestive Heart Failure Mother     . Heart disease Mother   . Heart disease Father   . Diabetes Brother          Allergies  Allergen Reactions  . Sulfa Antibiotics      REVIEW OF SYSTEMS(Negative unless checked)  Constitutional: [] ?Weight loss[] ?Fever[] ?Chills Cardiac:[] ?Chest pain[] ?Chest pressure[] ?Palpitations [] ?Shortness of breath when laying flat [] ?Shortness of breath at rest [] ?Shortness of breath with exertion. Vascular: [] ?Pain in legs with walking[] ?Pain in legsat rest[] ?Pain in legs when laying flat [] ?Claudication [] ?Pain in feet when walking [] ?Pain in feet at rest [] ?Pain in feet when laying flat [] ?History of DVT [] ?Phlebitis [] ?Swelling in legs [] ?Varicose veins [] ?Non-healing ulcers Pulmonary: [] ?Uses home oxygen [] ?Productive cough[] ?Hemoptysis [] ?Wheeze [] ?COPD [] ?Asthma Neurologic: [] ?Dizziness [] ?Blackouts [] ?Seizures [] ?History of stroke [] ?History of TIA[] ?Aphasia [] ?Temporary blindness[] ?Dysphagia [] ?Weaknessor numbness in arms [] ?Weakness or numbnessin legs Musculoskeletal: [x] ?Arthritis [] ?Joint swelling [x] ?Joint pain [] ?Low back pain Hematologic:[] ?Easy bruising[] ?Easy bleeding [] ?Hypercoagulable state [] ?Anemic [] ?Hepatitis Gastrointestinal:[] ?Blood in stool[] ?Vomiting blood[] ?Gastroesophageal reflux/heartburn[] ?Difficulty swallowing. Genitourinary: [] ?Chronic kidney disease [] ?Difficulturination [] ?Frequenturination [] ?Burning with urination[] ?Blood in urine Skin: [] ?Rashes [] ?Ulcers [] ?Wounds Psychological: [] ?History of anxiety[] ?History of major depression.    Physical Examination  Vitals:   08/20/18 0957  BP: (!) 160/76  Pulse: 93  Resp: 18  Weight: 161 lb (73 kg)  Height: 5\' 2"  (1.575 m)   Body mass index is 29.45 kg/m. Gen:  WD/WN, NAD Head: [place]/AT, No temporalis wasting. Ear/Nose/Throat: Hearing grossly intact, nares w/o erythema or  drainage, trachea midline Eyes: Conjunctiva clear. Sclera non-icteric Neck: Supple.  No carotid bruits Pulmonary:  Good air movement, equal and clear to auscultation bilaterally.  Cardiac: RRR, No JVD Vascular:  Vessel Right Left  Radial Palpable Palpable               Musculoskeletal: M/S 5/5 throughout. Walks with a walker. No deformity or atrophy. Mild LE edema. Neurologic: CN 2-12 intact. Sensation grossly intact in extremities.  Symmetrical.  Speech is fluent. Motor exam as listed above. Psychiatric: Judgment intact, Mood & affect appropriate for pt's clinical situation. Dermatologic: No rashes or ulcers noted.  No cellulitis or open wounds.      CBC Lab Results  Component Value Date   WBC 13.0 (H) 03/29/2018   HGB 9.6 (L) 03/29/2018   HCT 27.9 (L) 03/29/2018   MCV 100.0 03/29/2018   PLT 223 03/29/2018    BMET    Component Value Date/Time   NA 135 03/29/2018 0335   NA 138 11/21/2017 1132   NA 142 04/07/2014 0409   K 5.0 03/29/2018 0335   K 4.0 04/07/2014 0409   CL 101 03/29/2018 0335   CL 107 04/07/2014 0409   CO2 28 03/29/2018 0335   CO2 26 04/07/2014 0409   GLUCOSE 108 (H) 03/29/2018 0335  GLUCOSE 87 04/07/2014 0409   BUN 32 (H) 03/29/2018 0335   BUN 35 (H) 11/21/2017 1132   BUN 26 (H) 04/07/2014 0409   CREATININE 1.46 (H) 03/29/2018 0335   CREATININE 1.43 (H) 04/07/2014 0409   CALCIUM 9.0 03/29/2018 0335   CALCIUM 8.8 04/07/2014 0409   GFRNONAA 31 (L) 03/29/2018 0335   GFRNONAA 33 (L) 04/07/2014 0409   GFRAA 36 (L) 03/29/2018 0335   GFRAA 39 (L) 04/07/2014 0409   CrCl cannot be calculated (Patient's most recent lab result is older than the maximum 21 days allowed.).  COAG Lab Results  Component Value Date   INR 0.94 03/25/2018   INR 1.0 06/19/2013    Radiology No results found.   Assessment/Plan Hyperlipidemia lipid control important in reducing the progression of atherosclerotic disease. Continue statin therapy   Essential  hypertension, benign blood pressure control important in reducing the progression of atherosclerotic disease. On appropriate oral medications.  Carotid stenosis Patient is now doing well status post bilateral carotid endarterectomies.  The right was done many years ago.  The left was done last year.  Her wounds are healed.  Her duplex today shows mildly elevated velocities in the left carotid artery after endarterectomy but nothing worrisome.  The right carotid endarterectomy velocities are normal.  She will return in 6 months with follow-up duplex.  She will continue her current medical regimen.  She will contact our office with any problems in the interim.    Leotis Pain, MD  08/20/2018 10:42 AM    This note was created with Dragon medical transcription system.  Any errors from dictation are purely unintentional

## 2018-08-20 NOTE — Assessment & Plan Note (Signed)
Patient is now doing well status post bilateral carotid endarterectomies.  The right was done many years ago.  The left was done last year.  Her wounds are healed.  Her duplex today shows mildly elevated velocities in the left carotid artery after endarterectomy but nothing worrisome.  The right carotid endarterectomy velocities are normal.  She will return in 6 months with follow-up duplex.  She will continue her current medical regimen.  She will contact our office with any problems in the interim.

## 2018-08-27 ENCOUNTER — Encounter: Payer: Self-pay | Admitting: Nurse Practitioner

## 2018-08-27 ENCOUNTER — Ambulatory Visit (INDEPENDENT_AMBULATORY_CARE_PROVIDER_SITE_OTHER): Payer: Medicare HMO | Admitting: Nurse Practitioner

## 2018-08-27 VITALS — BP 180/64 | HR 102 | Temp 96.0°F | Resp 16 | Ht 62.0 in | Wt 163.2 lb

## 2018-08-27 DIAGNOSIS — J0140 Acute pansinusitis, unspecified: Secondary | ICD-10-CM | POA: Diagnosis not present

## 2018-08-27 DIAGNOSIS — R300 Dysuria: Secondary | ICD-10-CM | POA: Diagnosis not present

## 2018-08-27 DIAGNOSIS — I10 Essential (primary) hypertension: Secondary | ICD-10-CM | POA: Diagnosis not present

## 2018-08-27 DIAGNOSIS — R531 Weakness: Secondary | ICD-10-CM

## 2018-08-27 DIAGNOSIS — Z0001 Encounter for general adult medical examination with abnormal findings: Secondary | ICD-10-CM

## 2018-08-27 MED ORDER — AMOXICILLIN 875 MG PO TABS: 875.0000 mg | ORAL_TABLET | Freq: Two times a day (BID) | ORAL | 0 refills | Status: DC

## 2018-08-27 NOTE — Progress Notes (Signed)
[facility] [place],  [id number]  Internal MEDICINE  Office Visit Note  Patient Name: [name]  [id number]  [id number]  Date of Service: 08/27/2018   Pt is here for routine health maintenance examination  Chief Complaint  Patient presents with  . Annual Exam  . Hyperlipidemia  . Hypertension     The patient is here for routine health maintenance exam. States that she was sick through the entire holiday. She had cough, nasal congestion, headache, and fatigue. Did gradually improve on it's own. She lives with her daughter and son-in-law. They have recently started having similar symptoms and now her symptoms are starting to return. Has dry, harsh, nagging cough. Feels fatigued.  She saw Dr. Lucky Cowboy. Was told that her carotid arteries are working 'perfect/" she will go back for follow up in July, 2020. If all is well, he will release her for one year.  Blood pressure is elevated today. She states that it is generally elevated when she first gets to doctor appointments. Hard for her to get up and down stairs and up and off curbs.     Current Medication: Outpatient Encounter Medications as of 08/27/2018  Medication Sig Note  . allopurinol (ZYLOPRIM) 100 MG tablet take 2 tablets by mouth at bedtime for gout   . ALPRAZolam (XANAX) 0.25 MG tablet Take 1 tablet (0.25 mg total) by mouth at bedtime as needed for anxiety.   Marland Kitchen amLODipine (NORVASC) 2.5 MG tablet take 1 tablet by mouth at lunch   . aspirin EC 81 MG EC tablet Take 1 tablet (81 mg total) by mouth daily.   . benazepril (LOTENSIN) 5 MG tablet Take 1 tablet (5 mg total) by mouth 2 (two) times daily.   . Cholecalciferol (VITAMIN D3) 2000 units capsule Take 2,000 Units by mouth daily.    . cloNIDine (CATAPRES - DOSED IN MG/24 HR) 0.2 mg/24hr patch apply 1 patch every week 03/17/2018: Every Saturday  . clopidogrel (PLAVIX) 75 MG tablet Take 1 tablet (75 mg total) by mouth daily with breakfast.   .  conjugated estrogens (PREMARIN) vaginal cream Place 1 Applicatorful vaginally once a week. At night.   . cyanocobalamin (,VITAMIN B-12,) 1000 MCG/ML injection INJECT 1 MILLILITER INTRAMUSCULARLY ONCE A MONTH   . estrogens, conjugated, (PREMARIN) 0.625 MG tablet Take 0.625 mg by mouth daily. Take daily for 21 days then do not take for 7 days.   . FEROSUL 325 (65 Fe) MG tablet take 1 tablet by mouth twice a day   . fluticasone (FLONASE) 50 MCG/ACT nasal spray Place 1 spray into both nostrils daily as needed for allergies.    . Multiple Vitamins-Minerals (CENTRUM SILVER 50+WOMEN) TABS Take 1 tablet by mouth daily.   . naproxen (NAPROSYN) 250 MG tablet Take by mouth 2 (two) times daily with a meal.   . Olopatadine HCl 0.2 % SOLN Apply 1 drop to eye daily.   Marland Kitchen omeprazole (PRILOSEC) 20 MG capsule take 1 capsule by mouth once daily FOR BELCHING AND STOMACH.   . pravastatin (PRAVACHOL) 20 MG tablet take 1 tablet by mouth once daily   . triamcinolone (KENALOG) 0.025 % cream Apply 1 application topically 2 (two) times daily.   Marland Kitchen amoxicillin (AMOXIL) 875 MG tablet Take 1 tablet (875 mg total) by mouth 2 (two) times daily.    No facility-administered encounter medications on file as of 08/27/2018.     Surgical History: Past Surgical History:  Procedure Laterality Date  . APPENDECTOMY    .  BACK SURGERY    . CAROTID ENDARTERECTOMY Right   . CHOLECYSTECTOMY    . ENDARTERECTOMY Left 03/25/2018   Procedure: ENDARTERECTOMY CAROTID;  Surgeon: Algernon Huxley, MD;  Location: [facility] ORS;  Service: Vascular;  Laterality: Left;  . EYE SURGERY Bilateral   . FOOT SURGERY Left   . HERNIA REPAIR    . HIP SURGERY Left   . TONSILLECTOMY      Medical History: Past Medical History:  Diagnosis Date  . Allergy   . Arthritis   . CKD (chronic kidney disease), stage III ([place])   . GERD (gastroesophageal reflux disease)   . Hyperlipidemia   . Hypertension   . Insomnia   . Stroke [facility])     Family History: Family  History  Problem Relation Age of Onset  . Congestive Heart Failure Mother   . Heart disease Mother   . Heart disease Father   . Diabetes Brother       Review of Systems  Constitutional: Positive for activity change and fatigue. Negative for chills and unexpected weight change.  HENT: Positive for congestion, ear pain, postnasal drip, rhinorrhea and sinus pressure. Negative for sneezing and sore throat.   Respiratory: Positive for cough and wheezing. Negative for chest tightness and shortness of breath.   Cardiovascular: Negative for chest pain and palpitations.  Gastrointestinal: Negative for abdominal pain, constipation, diarrhea, nausea and vomiting.  Endocrine: Negative for cold intolerance, heat intolerance, polydipsia and polyuria.  Genitourinary: Negative for dysuria.  Musculoskeletal: Negative for arthralgias, back pain, joint swelling and neck pain.       Patient using a walker to help with mobility.  Skin: Negative for rash.  Allergic/Immunologic: Positive for environmental allergies.  Neurological: Positive for weakness. Negative for dizziness, tremors, numbness and headaches.  Hematological: Positive for adenopathy. Does not bruise/bleed easily.  Psychiatric/Behavioral: Negative for behavioral problems (Depression), sleep disturbance and suicidal ideas. The patient is nervous/anxious.      Today's Vitals   08/27/18 0833  BP: (!) 180/64  Pulse: (!) 102  Resp: 16  Temp: (!) 96 F (35.6 C)  SpO2: 92%  Weight: 163 lb 3.2 oz (74 kg)    Physical Exam Vitals signs and nursing note reviewed.  Constitutional:      General: She is not in acute distress.    Appearance: She is well-developed. She is ill-appearing. She is not diaphoretic.  HENT:     Head: Normocephalic and atraumatic.     Right Ear: Tympanic membrane is bulging.     Left Ear: Tympanic membrane is bulging.     Nose: Congestion and rhinorrhea present. Rhinorrhea is clear.     Right Sinus: Maxillary sinus  tenderness and frontal sinus tenderness present.     Left Sinus: Maxillary sinus tenderness and frontal sinus tenderness present.     Mouth/Throat:     Pharynx: Posterior oropharyngeal erythema present. No oropharyngeal exudate.  Eyes:     Pupils: Pupils are equal, round, and reactive to light.  Neck:     Musculoskeletal: Normal range of motion and neck supple.     Thyroid: No thyromegaly.     Vascular: No JVD.     Trachea: No tracheal deviation.  Cardiovascular:     Rate and Rhythm: Normal rate and regular rhythm.     Pulses: Normal pulses.     Heart sounds: Murmur present. No friction rub. No gallop.   Pulmonary:     Effort: Pulmonary effort is normal. No respiratory distress.  Breath sounds: Normal breath sounds. No wheezing or rales.     Comments: Mild, dry cough present.  Chest:     Chest wall: No tenderness.  Abdominal:     General: Bowel sounds are normal.     Palpations: Abdomen is soft.  Musculoskeletal: Normal range of motion.  Lymphadenopathy:     Cervical: Cervical adenopathy present.  Skin:    General: Skin is warm and dry.     Capillary Refill: Capillary refill takes 2 to 3 seconds.  Neurological:     Mental Status: She is alert and oriented to person, place, and time. Mental status is at baseline.     Cranial Nerves: No cranial nerve deficit.  Psychiatric:        Behavior: Behavior normal.        Thought Content: Thought content normal.        Judgment: Judgment normal.    Depression screen [facility] 2/9 08/27/2018 07/23/2018 05/21/2018 04/30/2018 11/09/2017  Decreased Interest 0 0 0 0 0  Down, Depressed, Hopeless 0 0 0 0 0  PHQ - 2 Score 0 0 0 0 0    Functional Status Survey: Is the patient deaf or have difficulty hearing?: Yes Does the patient have difficulty seeing, even when wearing glasses/contacts?: No Does the patient have difficulty concentrating, remembering, or making decisions?: No Does the patient have difficulty walking or climbing stairs?:  Yes Does the patient have difficulty dressing or bathing?: Yes Does the patient have difficulty doing errands alone such as visiting a doctor's office or shopping?: Yes  MMSE - [place] Exam 08/27/2018  Orientation to time 5  Orientation to Place 5  Registration 3  Attention/ Calculation 5  Recall 3  Language- name 2 objects 2  Language- repeat 1  Language- follow 3 step command 3  Language- read & follow direction 1  Write a sentence 0  Copy design 1  Total score 29    Fall Risk  08/27/2018 07/23/2018 05/21/2018 04/30/2018 11/09/2017  Falls in the past year? 1 0 Yes No Yes  Number falls in past yr: 0 0 1 - 1  Injury with Fall? 1 0 Yes - -      LABS: Recent Results (from the past 2160 hour(s))  CULTURE, URINE COMPREHENSIVE     Status: None   Collection Time: 07/16/18  9:56 AM  Result Value Ref Range   Urine Culture, Comprehensive Final report    Organism ID, Bacteria Comment     Comment: Mixed urogenital flora 5,000  Colonies/mL   POCT Urine Drug Screen     Status: Abnormal   Collection Time: 07/23/18  9:53 AM  Result Value Ref Range   POC METHAMPHETAMINE UR None Detected None Detected   POC Opiate Ur None Detected None Detected   POC Barbiturate UR None Detected None Detected   POC Amphetamine UR None Detected None Detected   POC Oxycodone UR None Detected None Detected   POC Cocaine UR None Detected None Detected   POC Ecstasy UR None Detected None Detected   POC TRICYCLICS UR None Detected None Detected   POC PHENCYCLIDINE UR None Detected None Detected   POC MARIJUANA UR None Detected None Detected   POC METHADONE UR None Detected None Detected   POC BENZODIAZEPINES UR Positive (A) None Detected   URINE TEMPERATURE     POC DRUG SCREEN OXIDANTS URINE     POC SPECIFIC GRAVITY URINE     POC PH URINE  Methylenedioxyamphetamine    POCT Urinalysis Dipstick     Status: Abnormal   Collection Time: 07/23/18  9:54 AM  Result Value Ref Range   Color, UA      Clarity, UA     Glucose, UA Negative Negative   Bilirubin, UA negative    Ketones, UA negative    Spec Grav, UA 1.010 1.010 - 1.025   Blood, UA trace    pH, UA 5.0 5.0 - 8.0   Protein, UA Negative Negative   Urobilinogen, UA 0.2 0.2 or 1.0 E.U./dL   Nitrite, UA     Leukocytes, UA Trace (A) Negative   Appearance     Odor     Assessment/Plan: 1. Encounter for general adult medical examination with abnormal findings Annual health maintenance exam performed today.   2. Acute non-recurrent pansinusitis Start amoxillin 875mg  twice daily for 10 days. Advised use of OTC mucinex to help improve symptoms. Samples given to her today.  - amoxicillin (AMOXIL) 875 MG tablet; Take 1 tablet (875 mg total) by mouth 2 (two) times daily.  Dispense: 20 tablet; Refill: 0  3. Essential hypertension, benign Generally stable, though elevated today. Continue to monitor closely. Patient will also monitor at home. Adjust as indicated.   4. Generalized weakness Continue to use walker at all times  5. Dysuria - UA/M w/rflx Culture, Routine  General Counseling: [name] verbalizes understanding of the findings of todays visit and agrees with plan of treatment. I have discussed any further diagnostic evaluation that may be needed or ordered today. We also reviewed her medications today. she has been encouraged to call the office with any questions or concerns that should arise related to todays visit.    Counseling:  Rest and increase fluids. Continue using OTC medication to control symptoms.   Hypertension Counseling:   The following hypertensive lifestyle modification were recommended and discussed:  1. Limiting alcohol intake to less than 1 oz/day of ethanol:(24 oz of beer or 8 oz of wine or 2 oz of 100-proof whiskey). 2. Take baby ASA 81 mg daily. 3. Importance of regular aerobic exercise and losing weight. 4. Reduce dietary saturated fat and cholesterol intake for overall cardiovascular health. 5.  Maintaining adequate dietary potassium, calcium, and magnesium intake. 6. Regular monitoring of the blood pressure. 7. Reduce sodium intake to less than 100 mmol/day (less than 2.3 gm of sodium or less than 6 gm of sodium choride)   This patient was seen by [place] with Dr Lavera Guise as a part of collaborative care agreement  Orders Placed This Encounter  Procedures  . UA/M w/rflx Culture, Routine    Meds ordered this encounter  Medications  . amoxicillin (AMOXIL) 875 MG tablet    Sig: Take 1 tablet (875 mg total) by mouth 2 (two) times daily.    Dispense:  20 tablet    Refill:  0    Order Specific Question:   Supervising Provider    Answer:   Lavera Guise [[id number]]    Time spent: [place], MD  Internal Medicine

## 2018-08-29 DIAGNOSIS — E1122 Type 2 diabetes mellitus with diabetic chronic kidney disease: Secondary | ICD-10-CM | POA: Diagnosis not present

## 2018-08-29 DIAGNOSIS — D519 Vitamin B12 deficiency anemia, unspecified: Secondary | ICD-10-CM | POA: Diagnosis not present

## 2018-08-29 DIAGNOSIS — I129 Hypertensive chronic kidney disease with stage 1 through stage 4 chronic kidney disease, or unspecified chronic kidney disease: Secondary | ICD-10-CM | POA: Diagnosis not present

## 2018-08-29 DIAGNOSIS — E782 Mixed hyperlipidemia: Secondary | ICD-10-CM | POA: Diagnosis not present

## 2018-08-29 DIAGNOSIS — M19012 Primary osteoarthritis, left shoulder: Secondary | ICD-10-CM | POA: Diagnosis not present

## 2018-08-29 DIAGNOSIS — Z7951 Long term (current) use of inhaled steroids: Secondary | ICD-10-CM | POA: Diagnosis not present

## 2018-08-29 DIAGNOSIS — N183 Chronic kidney disease, stage 3 (moderate): Secondary | ICD-10-CM | POA: Diagnosis not present

## 2018-08-29 DIAGNOSIS — G2581 Restless legs syndrome: Secondary | ICD-10-CM | POA: Diagnosis not present

## 2018-08-29 DIAGNOSIS — Z7902 Long term (current) use of antithrombotics/antiplatelets: Secondary | ICD-10-CM | POA: Diagnosis not present

## 2018-08-30 DIAGNOSIS — E782 Mixed hyperlipidemia: Secondary | ICD-10-CM | POA: Diagnosis not present

## 2018-08-30 DIAGNOSIS — E1122 Type 2 diabetes mellitus with diabetic chronic kidney disease: Secondary | ICD-10-CM | POA: Diagnosis not present

## 2018-08-30 DIAGNOSIS — M19012 Primary osteoarthritis, left shoulder: Secondary | ICD-10-CM | POA: Diagnosis not present

## 2018-08-30 DIAGNOSIS — N183 Chronic kidney disease, stage 3 (moderate): Secondary | ICD-10-CM | POA: Diagnosis not present

## 2018-08-30 DIAGNOSIS — I129 Hypertensive chronic kidney disease with stage 1 through stage 4 chronic kidney disease, or unspecified chronic kidney disease: Secondary | ICD-10-CM | POA: Diagnosis not present

## 2018-08-30 DIAGNOSIS — G2581 Restless legs syndrome: Secondary | ICD-10-CM | POA: Diagnosis not present

## 2018-08-30 DIAGNOSIS — Z7902 Long term (current) use of antithrombotics/antiplatelets: Secondary | ICD-10-CM | POA: Diagnosis not present

## 2018-08-30 DIAGNOSIS — D519 Vitamin B12 deficiency anemia, unspecified: Secondary | ICD-10-CM | POA: Diagnosis not present

## 2018-08-30 DIAGNOSIS — Z7951 Long term (current) use of inhaled steroids: Secondary | ICD-10-CM | POA: Diagnosis not present

## 2018-09-02 ENCOUNTER — Other Ambulatory Visit: Payer: Self-pay

## 2018-09-02 ENCOUNTER — Telehealth: Payer: Self-pay

## 2018-09-02 LAB — UA/M W/RFLX CULTURE, ROUTINE
Bilirubin, UA: NEGATIVE
Glucose, UA: NEGATIVE
Ketones, UA: NEGATIVE
Nitrite, UA: NEGATIVE
PH UA: 6 (ref 5.0–7.5)
RBC, UA: NEGATIVE
Specific Gravity, UA: 1.009 (ref 1.005–1.030)
Urobilinogen, Ur: 0.2 mg/dL (ref 0.2–1.0)

## 2018-09-02 LAB — URINE CULTURE, REFLEX

## 2018-09-02 LAB — MICROSCOPIC EXAMINATION: CASTS: NONE SEEN /LPF

## 2018-09-02 MED ORDER — CLOPIDOGREL BISULFATE 75 MG PO TABS: 75.0000 mg | ORAL_TABLET | Freq: Every day | ORAL | 3 refills | Status: AC

## 2018-09-02 NOTE — Telephone Encounter (Signed)
PT DID NOT FOLLOW PLAN OF CARE FOR HOME HEALTH SERVICES.

## 2018-09-06 DIAGNOSIS — D519 Vitamin B12 deficiency anemia, unspecified: Secondary | ICD-10-CM | POA: Diagnosis not present

## 2018-09-06 DIAGNOSIS — Z7902 Long term (current) use of antithrombotics/antiplatelets: Secondary | ICD-10-CM | POA: Diagnosis not present

## 2018-09-06 DIAGNOSIS — N183 Chronic kidney disease, stage 3 (moderate): Secondary | ICD-10-CM | POA: Diagnosis not present

## 2018-09-06 DIAGNOSIS — G2581 Restless legs syndrome: Secondary | ICD-10-CM | POA: Diagnosis not present

## 2018-09-06 DIAGNOSIS — Z7951 Long term (current) use of inhaled steroids: Secondary | ICD-10-CM | POA: Diagnosis not present

## 2018-09-06 DIAGNOSIS — E1122 Type 2 diabetes mellitus with diabetic chronic kidney disease: Secondary | ICD-10-CM | POA: Diagnosis not present

## 2018-09-06 DIAGNOSIS — I129 Hypertensive chronic kidney disease with stage 1 through stage 4 chronic kidney disease, or unspecified chronic kidney disease: Secondary | ICD-10-CM | POA: Diagnosis not present

## 2018-09-06 DIAGNOSIS — M19012 Primary osteoarthritis, left shoulder: Secondary | ICD-10-CM | POA: Diagnosis not present

## 2018-09-06 DIAGNOSIS — E782 Mixed hyperlipidemia: Secondary | ICD-10-CM | POA: Diagnosis not present

## 2018-09-12 ENCOUNTER — Other Ambulatory Visit: Payer: Self-pay

## 2018-09-12 MED ORDER — PRAVASTATIN SODIUM 20 MG PO TABS: 20.0000 mg | ORAL_TABLET | Freq: Every day | ORAL | 6 refills | Status: AC

## 2018-09-12 MED ORDER — CLONIDINE 0.2 MG/24HR TD PTWK: MEDICATED_PATCH | TRANSDERMAL | 5 refills | Status: AC

## 2018-09-12 MED ORDER — CYANOCOBALAMIN 1000 MCG/ML IJ SOLN: INTRAMUSCULAR | 3 refills | Status: DC

## 2018-09-20 DIAGNOSIS — Z7902 Long term (current) use of antithrombotics/antiplatelets: Secondary | ICD-10-CM | POA: Diagnosis not present

## 2018-09-20 DIAGNOSIS — D519 Vitamin B12 deficiency anemia, unspecified: Secondary | ICD-10-CM | POA: Diagnosis not present

## 2018-09-20 DIAGNOSIS — I129 Hypertensive chronic kidney disease with stage 1 through stage 4 chronic kidney disease, or unspecified chronic kidney disease: Secondary | ICD-10-CM | POA: Diagnosis not present

## 2018-09-20 DIAGNOSIS — M19012 Primary osteoarthritis, left shoulder: Secondary | ICD-10-CM | POA: Diagnosis not present

## 2018-09-20 DIAGNOSIS — Z7951 Long term (current) use of inhaled steroids: Secondary | ICD-10-CM | POA: Diagnosis not present

## 2018-09-20 DIAGNOSIS — E782 Mixed hyperlipidemia: Secondary | ICD-10-CM | POA: Diagnosis not present

## 2018-09-20 DIAGNOSIS — N183 Chronic kidney disease, stage 3 (moderate): Secondary | ICD-10-CM | POA: Diagnosis not present

## 2018-09-20 DIAGNOSIS — E1122 Type 2 diabetes mellitus with diabetic chronic kidney disease: Secondary | ICD-10-CM | POA: Diagnosis not present

## 2018-09-20 DIAGNOSIS — G2581 Restless legs syndrome: Secondary | ICD-10-CM | POA: Diagnosis not present

## 2018-09-25 DIAGNOSIS — Z7902 Long term (current) use of antithrombotics/antiplatelets: Secondary | ICD-10-CM | POA: Diagnosis not present

## 2018-09-25 DIAGNOSIS — M19012 Primary osteoarthritis, left shoulder: Secondary | ICD-10-CM | POA: Diagnosis not present

## 2018-09-25 DIAGNOSIS — E782 Mixed hyperlipidemia: Secondary | ICD-10-CM | POA: Diagnosis not present

## 2018-09-25 DIAGNOSIS — N183 Chronic kidney disease, stage 3 (moderate): Secondary | ICD-10-CM | POA: Diagnosis not present

## 2018-09-25 DIAGNOSIS — I129 Hypertensive chronic kidney disease with stage 1 through stage 4 chronic kidney disease, or unspecified chronic kidney disease: Secondary | ICD-10-CM | POA: Diagnosis not present

## 2018-09-25 DIAGNOSIS — G2581 Restless legs syndrome: Secondary | ICD-10-CM | POA: Diagnosis not present

## 2018-09-25 DIAGNOSIS — Z7951 Long term (current) use of inhaled steroids: Secondary | ICD-10-CM | POA: Diagnosis not present

## 2018-09-25 DIAGNOSIS — D519 Vitamin B12 deficiency anemia, unspecified: Secondary | ICD-10-CM | POA: Diagnosis not present

## 2018-09-25 DIAGNOSIS — E1122 Type 2 diabetes mellitus with diabetic chronic kidney disease: Secondary | ICD-10-CM | POA: Diagnosis not present

## 2018-09-27 DIAGNOSIS — I129 Hypertensive chronic kidney disease with stage 1 through stage 4 chronic kidney disease, or unspecified chronic kidney disease: Secondary | ICD-10-CM | POA: Diagnosis not present

## 2018-09-27 DIAGNOSIS — E1122 Type 2 diabetes mellitus with diabetic chronic kidney disease: Secondary | ICD-10-CM | POA: Diagnosis not present

## 2018-09-27 DIAGNOSIS — Z7902 Long term (current) use of antithrombotics/antiplatelets: Secondary | ICD-10-CM | POA: Diagnosis not present

## 2018-09-27 DIAGNOSIS — G2581 Restless legs syndrome: Secondary | ICD-10-CM | POA: Diagnosis not present

## 2018-09-27 DIAGNOSIS — N183 Chronic kidney disease, stage 3 (moderate): Secondary | ICD-10-CM | POA: Diagnosis not present

## 2018-09-27 DIAGNOSIS — E782 Mixed hyperlipidemia: Secondary | ICD-10-CM | POA: Diagnosis not present

## 2018-09-27 DIAGNOSIS — Z7951 Long term (current) use of inhaled steroids: Secondary | ICD-10-CM | POA: Diagnosis not present

## 2018-09-27 DIAGNOSIS — D519 Vitamin B12 deficiency anemia, unspecified: Secondary | ICD-10-CM | POA: Diagnosis not present

## 2018-09-27 DIAGNOSIS — M19012 Primary osteoarthritis, left shoulder: Secondary | ICD-10-CM | POA: Diagnosis not present

## 2018-10-01 ENCOUNTER — Telehealth: Payer: Self-pay

## 2018-10-01 NOTE — Telephone Encounter (Signed)
[place] VISITED PT TWICE ON 09/13/2018. BOTH VISITS WERE UNSUCESSFUL PT WAS NOT HOME AND THERE WAS NO ANSWER AT DOOR.

## 2018-10-04 DIAGNOSIS — E1122 Type 2 diabetes mellitus with diabetic chronic kidney disease: Secondary | ICD-10-CM | POA: Diagnosis not present

## 2018-10-04 DIAGNOSIS — N183 Chronic kidney disease, stage 3 (moderate): Secondary | ICD-10-CM | POA: Diagnosis not present

## 2018-10-04 DIAGNOSIS — E782 Mixed hyperlipidemia: Secondary | ICD-10-CM | POA: Diagnosis not present

## 2018-10-04 DIAGNOSIS — M19012 Primary osteoarthritis, left shoulder: Secondary | ICD-10-CM | POA: Diagnosis not present

## 2018-10-04 DIAGNOSIS — G2581 Restless legs syndrome: Secondary | ICD-10-CM | POA: Diagnosis not present

## 2018-10-04 DIAGNOSIS — Z7902 Long term (current) use of antithrombotics/antiplatelets: Secondary | ICD-10-CM | POA: Diagnosis not present

## 2018-10-04 DIAGNOSIS — I129 Hypertensive chronic kidney disease with stage 1 through stage 4 chronic kidney disease, or unspecified chronic kidney disease: Secondary | ICD-10-CM | POA: Diagnosis not present

## 2018-10-04 DIAGNOSIS — D519 Vitamin B12 deficiency anemia, unspecified: Secondary | ICD-10-CM | POA: Diagnosis not present

## 2018-10-04 DIAGNOSIS — Z7951 Long term (current) use of inhaled steroids: Secondary | ICD-10-CM | POA: Diagnosis not present

## 2018-10-09 DIAGNOSIS — I129 Hypertensive chronic kidney disease with stage 1 through stage 4 chronic kidney disease, or unspecified chronic kidney disease: Secondary | ICD-10-CM | POA: Diagnosis not present

## 2018-10-09 DIAGNOSIS — E1122 Type 2 diabetes mellitus with diabetic chronic kidney disease: Secondary | ICD-10-CM | POA: Diagnosis not present

## 2018-10-09 DIAGNOSIS — G2581 Restless legs syndrome: Secondary | ICD-10-CM | POA: Diagnosis not present

## 2018-10-09 DIAGNOSIS — Z7951 Long term (current) use of inhaled steroids: Secondary | ICD-10-CM | POA: Diagnosis not present

## 2018-10-09 DIAGNOSIS — D519 Vitamin B12 deficiency anemia, unspecified: Secondary | ICD-10-CM | POA: Diagnosis not present

## 2018-10-09 DIAGNOSIS — E782 Mixed hyperlipidemia: Secondary | ICD-10-CM | POA: Diagnosis not present

## 2018-10-09 DIAGNOSIS — N183 Chronic kidney disease, stage 3 (moderate): Secondary | ICD-10-CM | POA: Diagnosis not present

## 2018-10-09 DIAGNOSIS — M19012 Primary osteoarthritis, left shoulder: Secondary | ICD-10-CM | POA: Diagnosis not present

## 2018-10-09 DIAGNOSIS — Z7902 Long term (current) use of antithrombotics/antiplatelets: Secondary | ICD-10-CM | POA: Diagnosis not present

## 2018-10-11 ENCOUNTER — Other Ambulatory Visit: Payer: Self-pay

## 2018-10-11 DIAGNOSIS — E1122 Type 2 diabetes mellitus with diabetic chronic kidney disease: Secondary | ICD-10-CM | POA: Diagnosis not present

## 2018-10-11 DIAGNOSIS — G2581 Restless legs syndrome: Secondary | ICD-10-CM | POA: Diagnosis not present

## 2018-10-11 DIAGNOSIS — D519 Vitamin B12 deficiency anemia, unspecified: Secondary | ICD-10-CM | POA: Diagnosis not present

## 2018-10-11 DIAGNOSIS — Z7902 Long term (current) use of antithrombotics/antiplatelets: Secondary | ICD-10-CM | POA: Diagnosis not present

## 2018-10-11 DIAGNOSIS — I129 Hypertensive chronic kidney disease with stage 1 through stage 4 chronic kidney disease, or unspecified chronic kidney disease: Secondary | ICD-10-CM | POA: Diagnosis not present

## 2018-10-11 DIAGNOSIS — Z7951 Long term (current) use of inhaled steroids: Secondary | ICD-10-CM | POA: Diagnosis not present

## 2018-10-11 DIAGNOSIS — M19012 Primary osteoarthritis, left shoulder: Secondary | ICD-10-CM | POA: Diagnosis not present

## 2018-10-11 DIAGNOSIS — E782 Mixed hyperlipidemia: Secondary | ICD-10-CM | POA: Diagnosis not present

## 2018-10-11 DIAGNOSIS — N183 Chronic kidney disease, stage 3 (moderate): Secondary | ICD-10-CM | POA: Diagnosis not present

## 2018-10-11 MED ORDER — OMEPRAZOLE 20 MG PO CPDR: DELAYED_RELEASE_CAPSULE | ORAL | 5 refills | Status: AC

## 2018-10-17 ENCOUNTER — Other Ambulatory Visit: Payer: Self-pay

## 2018-10-17 MED ORDER — CYANOCOBALAMIN 1000 MCG/ML IJ SOLN: INTRAMUSCULAR | 3 refills | Status: AC

## 2018-10-18 DIAGNOSIS — Z7951 Long term (current) use of inhaled steroids: Secondary | ICD-10-CM | POA: Diagnosis not present

## 2018-10-18 DIAGNOSIS — E1122 Type 2 diabetes mellitus with diabetic chronic kidney disease: Secondary | ICD-10-CM | POA: Diagnosis not present

## 2018-10-18 DIAGNOSIS — G2581 Restless legs syndrome: Secondary | ICD-10-CM | POA: Diagnosis not present

## 2018-10-18 DIAGNOSIS — Z7902 Long term (current) use of antithrombotics/antiplatelets: Secondary | ICD-10-CM | POA: Diagnosis not present

## 2018-10-18 DIAGNOSIS — E782 Mixed hyperlipidemia: Secondary | ICD-10-CM | POA: Diagnosis not present

## 2018-10-18 DIAGNOSIS — N183 Chronic kidney disease, stage 3 (moderate): Secondary | ICD-10-CM | POA: Diagnosis not present

## 2018-10-18 DIAGNOSIS — M19012 Primary osteoarthritis, left shoulder: Secondary | ICD-10-CM | POA: Diagnosis not present

## 2018-10-18 DIAGNOSIS — D519 Vitamin B12 deficiency anemia, unspecified: Secondary | ICD-10-CM | POA: Diagnosis not present

## 2018-10-18 DIAGNOSIS — I129 Hypertensive chronic kidney disease with stage 1 through stage 4 chronic kidney disease, or unspecified chronic kidney disease: Secondary | ICD-10-CM | POA: Diagnosis not present

## 2018-10-22 ENCOUNTER — Emergency Department: Payer: Medicare HMO

## 2018-10-22 ENCOUNTER — Other Ambulatory Visit: Payer: Self-pay

## 2018-10-22 ENCOUNTER — Inpatient Hospital Stay
Admission: EM | Admit: 2018-10-22 | Discharge: 2018-11-13 | DRG: 193 | Disposition: E | Payer: Medicare HMO | Attending: Internal Medicine | Admitting: Internal Medicine

## 2018-10-22 DIAGNOSIS — E1122 Type 2 diabetes mellitus with diabetic chronic kidney disease: Secondary | ICD-10-CM | POA: Diagnosis present

## 2018-10-22 DIAGNOSIS — I361 Nonrheumatic tricuspid (valve) insufficiency: Secondary | ICD-10-CM | POA: Diagnosis not present

## 2018-10-22 DIAGNOSIS — J209 Acute bronchitis, unspecified: Secondary | ICD-10-CM | POA: Diagnosis not present

## 2018-10-22 DIAGNOSIS — Z79890 Hormone replacement therapy (postmenopausal): Secondary | ICD-10-CM | POA: Diagnosis not present

## 2018-10-22 DIAGNOSIS — I129 Hypertensive chronic kidney disease with stage 1 through stage 4 chronic kidney disease, or unspecified chronic kidney disease: Secondary | ICD-10-CM | POA: Diagnosis not present

## 2018-10-22 DIAGNOSIS — J45909 Unspecified asthma, uncomplicated: Secondary | ICD-10-CM | POA: Diagnosis present

## 2018-10-22 DIAGNOSIS — K219 Gastro-esophageal reflux disease without esophagitis: Secondary | ICD-10-CM | POA: Diagnosis present

## 2018-10-22 DIAGNOSIS — N184 Chronic kidney disease, stage 4 (severe): Secondary | ICD-10-CM | POA: Diagnosis present

## 2018-10-22 DIAGNOSIS — Z8673 Personal history of transient ischemic attack (TIA), and cerebral infarction without residual deficits: Secondary | ICD-10-CM

## 2018-10-22 DIAGNOSIS — Z66 Do not resuscitate: Secondary | ICD-10-CM | POA: Diagnosis not present

## 2018-10-22 DIAGNOSIS — J9621 Acute and chronic respiratory failure with hypoxia: Secondary | ICD-10-CM | POA: Diagnosis present

## 2018-10-22 DIAGNOSIS — I110 Hypertensive heart disease with heart failure: Secondary | ICD-10-CM | POA: Diagnosis not present

## 2018-10-22 DIAGNOSIS — M7989 Other specified soft tissue disorders: Secondary | ICD-10-CM | POA: Diagnosis not present

## 2018-10-22 DIAGNOSIS — Z515 Encounter for palliative care: Secondary | ICD-10-CM | POA: Diagnosis not present

## 2018-10-22 DIAGNOSIS — G2581 Restless legs syndrome: Secondary | ICD-10-CM | POA: Diagnosis present

## 2018-10-22 DIAGNOSIS — I5031 Acute diastolic (congestive) heart failure: Secondary | ICD-10-CM | POA: Diagnosis not present

## 2018-10-22 DIAGNOSIS — I214 Non-ST elevation (NSTEMI) myocardial infarction: Secondary | ICD-10-CM | POA: Diagnosis not present

## 2018-10-22 DIAGNOSIS — M109 Gout, unspecified: Secondary | ICD-10-CM | POA: Diagnosis present

## 2018-10-22 DIAGNOSIS — E039 Hypothyroidism, unspecified: Secondary | ICD-10-CM | POA: Diagnosis present

## 2018-10-22 DIAGNOSIS — Z7189 Other specified counseling: Secondary | ICD-10-CM | POA: Diagnosis not present

## 2018-10-22 DIAGNOSIS — Z882 Allergy status to sulfonamides status: Secondary | ICD-10-CM

## 2018-10-22 DIAGNOSIS — R0689 Other abnormalities of breathing: Secondary | ICD-10-CM | POA: Diagnosis not present

## 2018-10-22 DIAGNOSIS — D509 Iron deficiency anemia, unspecified: Secondary | ICD-10-CM | POA: Diagnosis not present

## 2018-10-22 DIAGNOSIS — J90 Pleural effusion, not elsewhere classified: Secondary | ICD-10-CM | POA: Diagnosis not present

## 2018-10-22 DIAGNOSIS — D631 Anemia in chronic kidney disease: Secondary | ICD-10-CM | POA: Diagnosis present

## 2018-10-22 DIAGNOSIS — E1151 Type 2 diabetes mellitus with diabetic peripheral angiopathy without gangrene: Secondary | ICD-10-CM | POA: Diagnosis present

## 2018-10-22 DIAGNOSIS — I248 Other forms of acute ischemic heart disease: Secondary | ICD-10-CM | POA: Diagnosis present

## 2018-10-22 DIAGNOSIS — I130 Hypertensive heart and chronic kidney disease with heart failure and stage 1 through stage 4 chronic kidney disease, or unspecified chronic kidney disease: Secondary | ICD-10-CM | POA: Diagnosis not present

## 2018-10-22 DIAGNOSIS — Z79899 Other long term (current) drug therapy: Secondary | ICD-10-CM

## 2018-10-22 DIAGNOSIS — J9601 Acute respiratory failure with hypoxia: Secondary | ICD-10-CM | POA: Diagnosis not present

## 2018-10-22 DIAGNOSIS — R0902 Hypoxemia: Secondary | ICD-10-CM | POA: Diagnosis not present

## 2018-10-22 DIAGNOSIS — R0600 Dyspnea, unspecified: Secondary | ICD-10-CM

## 2018-10-22 DIAGNOSIS — M1990 Unspecified osteoarthritis, unspecified site: Secondary | ICD-10-CM | POA: Diagnosis present

## 2018-10-22 DIAGNOSIS — R0602 Shortness of breath: Secondary | ICD-10-CM | POA: Diagnosis not present

## 2018-10-22 DIAGNOSIS — J189 Pneumonia, unspecified organism: Secondary | ICD-10-CM | POA: Diagnosis not present

## 2018-10-22 DIAGNOSIS — I82409 Acute embolism and thrombosis of unspecified deep veins of unspecified lower extremity: Secondary | ICD-10-CM

## 2018-10-22 DIAGNOSIS — Z8249 Family history of ischemic heart disease and other diseases of the circulatory system: Secondary | ICD-10-CM

## 2018-10-22 DIAGNOSIS — N183 Chronic kidney disease, stage 3 (moderate): Secondary | ICD-10-CM | POA: Diagnosis not present

## 2018-10-22 DIAGNOSIS — Z7982 Long term (current) use of aspirin: Secondary | ICD-10-CM | POA: Diagnosis not present

## 2018-10-22 DIAGNOSIS — E871 Hypo-osmolality and hyponatremia: Secondary | ICD-10-CM | POA: Diagnosis present

## 2018-10-22 DIAGNOSIS — J810 Acute pulmonary edema: Secondary | ICD-10-CM | POA: Diagnosis not present

## 2018-10-22 DIAGNOSIS — Z7902 Long term (current) use of antithrombotics/antiplatelets: Secondary | ICD-10-CM | POA: Diagnosis not present

## 2018-10-22 DIAGNOSIS — I340 Nonrheumatic mitral (valve) insufficiency: Secondary | ICD-10-CM | POA: Diagnosis not present

## 2018-10-22 DIAGNOSIS — Z7951 Long term (current) use of inhaled steroids: Secondary | ICD-10-CM

## 2018-10-22 DIAGNOSIS — J9811 Atelectasis: Secondary | ICD-10-CM | POA: Diagnosis not present

## 2018-10-22 DIAGNOSIS — E875 Hyperkalemia: Secondary | ICD-10-CM | POA: Diagnosis not present

## 2018-10-22 DIAGNOSIS — N170 Acute kidney failure with tubular necrosis: Secondary | ICD-10-CM | POA: Diagnosis not present

## 2018-10-22 DIAGNOSIS — R0603 Acute respiratory distress: Secondary | ICD-10-CM | POA: Diagnosis not present

## 2018-10-22 DIAGNOSIS — N179 Acute kidney failure, unspecified: Secondary | ICD-10-CM | POA: Diagnosis not present

## 2018-10-22 DIAGNOSIS — E785 Hyperlipidemia, unspecified: Secondary | ICD-10-CM | POA: Diagnosis present

## 2018-10-22 DIAGNOSIS — Z833 Family history of diabetes mellitus: Secondary | ICD-10-CM

## 2018-10-22 DIAGNOSIS — I509 Heart failure, unspecified: Secondary | ICD-10-CM | POA: Diagnosis not present

## 2018-10-22 DIAGNOSIS — Z9049 Acquired absence of other specified parts of digestive tract: Secondary | ICD-10-CM

## 2018-10-22 DIAGNOSIS — I10 Essential (primary) hypertension: Secondary | ICD-10-CM | POA: Diagnosis not present

## 2018-10-22 DIAGNOSIS — I739 Peripheral vascular disease, unspecified: Secondary | ICD-10-CM | POA: Diagnosis not present

## 2018-10-22 DIAGNOSIS — D72829 Elevated white blood cell count, unspecified: Secondary | ICD-10-CM | POA: Diagnosis not present

## 2018-10-22 DIAGNOSIS — Z87440 Personal history of urinary (tract) infections: Secondary | ICD-10-CM

## 2018-10-22 HISTORY — DX: Disorder of arteries and arterioles, unspecified: I77.9

## 2018-10-22 HISTORY — DX: Peripheral vascular disease, unspecified: I73.9

## 2018-10-22 HISTORY — DX: Other ill-defined heart diseases: I51.89

## 2018-10-22 LAB — COMPREHENSIVE METABOLIC PANEL
ALT: 42 U/L (ref 0–44)
AST: 52 U/L — ABNORMAL HIGH (ref 15–41)
Albumin: 3.3 g/dL — ABNORMAL LOW (ref 3.5–5.0)
Alkaline Phosphatase: 86 U/L (ref 38–126)
Anion gap: 11 (ref 5–15)
BILIRUBIN TOTAL: 0.7 mg/dL (ref 0.3–1.2)
BUN: 35 mg/dL — ABNORMAL HIGH (ref 8–23)
CO2: 20 mmol/L — ABNORMAL LOW (ref 22–32)
Calcium: 8.8 mg/dL — ABNORMAL LOW (ref 8.9–10.3)
Chloride: 95 mmol/L — ABNORMAL LOW (ref 98–111)
Creatinine, Ser: 1.53 mg/dL — ABNORMAL HIGH (ref 0.44–1.00)
GFR calc Af Amer: 35 mL/min — ABNORMAL LOW (ref >60)
GFR calc non Af Amer: 30 mL/min — ABNORMAL LOW (ref >60)
Glucose, Bld: 141 mg/dL — ABNORMAL HIGH (ref 70–99)
Potassium: 4.9 mmol/L (ref 3.5–5.1)
Sodium: 126 mmol/L — ABNORMAL LOW (ref 135–145)
Total Protein: 6.9 g/dL (ref 6.5–8.1)

## 2018-10-22 LAB — SODIUM: Sodium: 128 mmol/L — ABNORMAL LOW (ref 135–145)

## 2018-10-22 LAB — CBC
HCT: 26.7 % — ABNORMAL LOW (ref 36.0–46.0)
Hemoglobin: 9.1 g/dL — ABNORMAL LOW (ref 12.0–15.0)
MCH: 32.7 pg (ref 26.0–34.0)
MCHC: 34.1 g/dL (ref 30.0–36.0)
MCV: 96 fL (ref 80.0–100.0)
Platelets: 329 10*3/uL (ref 150–400)
RBC: 2.78 MIL/uL — ABNORMAL LOW (ref 3.87–5.11)
RDW: 13.6 % (ref 11.5–15.5)
WBC: 13 10*3/uL — ABNORMAL HIGH (ref 4.0–10.5)
nRBC: 0.2 % (ref 0.0–0.2)

## 2018-10-22 LAB — TROPONIN I
Troponin I: 0.73 ng/mL (ref <0.03)
Troponin I: 0.78 ng/mL (ref <0.03)

## 2018-10-22 LAB — BRAIN NATRIURETIC PEPTIDE: B Natriuretic Peptide: 801 pg/mL — ABNORMAL HIGH (ref 0.0–100.0)

## 2018-10-22 MED ORDER — ADULT MULTIVITAMIN W/MINERALS CH
1.0000 | ORAL_TABLET | Freq: Every day | ORAL | Status: DC
  Administered 2018-10-23 – 2018-10-26 (×4): 1 via ORAL
  Filled 2018-10-22 (×4): qty 1

## 2018-10-22 MED ORDER — SODIUM CHLORIDE 0.9 % IV SOLN
250.0000 mL | INTRAVENOUS | Status: DC | PRN
  Administered 2018-10-26: 250 mL via INTRAVENOUS

## 2018-10-22 MED ORDER — CLOPIDOGREL BISULFATE 75 MG PO TABS
75.0000 mg | ORAL_TABLET | Freq: Every day | ORAL | Status: DC
  Administered 2018-10-23 – 2018-10-26 (×4): 75 mg via ORAL
  Filled 2018-10-22 (×4): qty 1

## 2018-10-22 MED ORDER — NITROGLYCERIN 0.4 MG SL SUBL
0.4000 mg | SUBLINGUAL_TABLET | SUBLINGUAL | Status: DC | PRN
  Administered 2018-10-23 (×2): 0.4 mg via SUBLINGUAL
  Filled 2018-10-22 (×2): qty 1

## 2018-10-22 MED ORDER — SODIUM CHLORIDE 0.9% FLUSH: 3.0000 mL | INTRAVENOUS | Status: DC | PRN

## 2018-10-22 MED ORDER — PRAVASTATIN SODIUM 20 MG PO TABS
20.0000 mg | ORAL_TABLET | Freq: Every day | ORAL | Status: DC
  Administered 2018-10-22: 20 mg via ORAL

## 2018-10-22 MED ORDER — ONDANSETRON HCL 4 MG/2ML IJ SOLN: 4.0000 mg | Freq: Four times a day (QID) | INTRAMUSCULAR | Status: DC | PRN

## 2018-10-22 MED ORDER — ASPIRIN 300 MG RE SUPP: 300.0000 mg | RECTAL | Status: DC

## 2018-10-22 MED ORDER — PANTOPRAZOLE SODIUM 40 MG PO TBEC
40.0000 mg | DELAYED_RELEASE_TABLET | Freq: Every day | ORAL | Status: DC
  Administered 2018-10-23 – 2018-10-26 (×4): 40 mg via ORAL
  Filled 2018-10-22 (×4): qty 1

## 2018-10-22 MED ORDER — HEPARIN (PORCINE) 25000 UT/250ML-% IV SOLN
1000.0000 [IU]/h | INTRAVENOUS | Status: DC
  Administered 2018-10-22: 800 [IU]/h via INTRAVENOUS
  Administered 2018-10-23: 900 [IU]/h via INTRAVENOUS
  Filled 2018-10-22 (×2): qty 250

## 2018-10-22 MED ORDER — BENAZEPRIL HCL 5 MG PO TABS
5.0000 mg | ORAL_TABLET | Freq: Two times a day (BID) | ORAL | Status: DC
  Administered 2018-10-22: 5 mg via ORAL
  Filled 2018-10-22 (×3): qty 1

## 2018-10-22 MED ORDER — ALLOPURINOL 100 MG PO TABS
200.0000 mg | ORAL_TABLET | Freq: Every day | ORAL | Status: DC
  Administered 2018-10-22 – 2018-10-26 (×5): 200 mg via ORAL
  Filled 2018-10-22 (×5): qty 2

## 2018-10-22 MED ORDER — OLOPATADINE HCL 0.1 % OP SOLN
1.0000 [drp] | Freq: Two times a day (BID) | OPHTHALMIC | Status: DC
  Administered 2018-10-23 – 2018-10-27 (×9): 1 [drp] via OPHTHALMIC
  Filled 2018-10-22: qty 5

## 2018-10-22 MED ORDER — SODIUM CHLORIDE 0.9 % IV SOLN: Freq: Once | INTRAVENOUS | Status: DC

## 2018-10-22 MED ORDER — HEPARIN BOLUS VIA INFUSION
4000.0000 [IU] | Freq: Once | INTRAVENOUS | Status: AC
  Administered 2018-10-22: 4000 [IU] via INTRAVENOUS
  Filled 2018-10-22: qty 4000

## 2018-10-22 MED ORDER — CARVEDILOL 3.125 MG PO TABS
3.1250 mg | ORAL_TABLET | Freq: Two times a day (BID) | ORAL | Status: DC
  Administered 2018-10-23 – 2018-10-26 (×6): 3.125 mg via ORAL
  Filled 2018-10-22 (×7): qty 1

## 2018-10-22 MED ORDER — SODIUM CHLORIDE 0.9% FLUSH
3.0000 mL | Freq: Two times a day (BID) | INTRAVENOUS | Status: DC
  Administered 2018-10-23 – 2018-10-27 (×8): 3 mL via INTRAVENOUS

## 2018-10-22 MED ORDER — ASPIRIN 81 MG PO CHEW
324.0000 mg | CHEWABLE_TABLET | ORAL | Status: DC
  Filled 2018-10-22: qty 4

## 2018-10-22 MED ORDER — FUROSEMIDE 10 MG/ML IJ SOLN
60.0000 mg | Freq: Once | INTRAMUSCULAR | Status: AC
  Administered 2018-10-22: 60 mg via INTRAVENOUS
  Filled 2018-10-22: qty 8

## 2018-10-22 MED ORDER — ALBUTEROL SULFATE (2.5 MG/3ML) 0.083% IN NEBU
5.0000 mg | INHALATION_SOLUTION | Freq: Once | RESPIRATORY_TRACT | Status: AC
  Administered 2018-10-22: 5 mg via RESPIRATORY_TRACT
  Filled 2018-10-22: qty 6

## 2018-10-22 MED ORDER — ASPIRIN EC 81 MG PO TBEC: 81.0000 mg | DELAYED_RELEASE_TABLET | Freq: Every day | ORAL | Status: DC

## 2018-10-22 MED ORDER — ALPRAZOLAM 0.25 MG PO TABS
0.2500 mg | ORAL_TABLET | Freq: Every evening | ORAL | Status: DC | PRN
  Administered 2018-10-22 – 2018-10-25 (×4): 0.25 mg via ORAL
  Filled 2018-10-22 (×4): qty 1

## 2018-10-22 MED ORDER — ACETAMINOPHEN 325 MG PO TABS: 650.0000 mg | ORAL_TABLET | ORAL | Status: DC | PRN

## 2018-10-22 MED ORDER — FUROSEMIDE 10 MG/ML IJ SOLN
60.0000 mg | Freq: Two times a day (BID) | INTRAMUSCULAR | Status: DC
  Administered 2018-10-23: 60 mg via INTRAVENOUS
  Filled 2018-10-22: qty 6

## 2018-10-22 MED ORDER — VITAMIN D3 25 MCG (1000 UNIT) PO TABS
2000.0000 [IU] | ORAL_TABLET | Freq: Every day | ORAL | Status: DC
  Administered 2018-10-23 – 2018-10-26 (×4): 2000 [IU] via ORAL
  Filled 2018-10-22 (×5): qty 2

## 2018-10-22 MED ORDER — ASPIRIN EC 81 MG PO TBEC
81.0000 mg | DELAYED_RELEASE_TABLET | Freq: Every day | ORAL | Status: DC
  Administered 2018-10-23 – 2018-10-26 (×4): 81 mg via ORAL
  Filled 2018-10-22 (×4): qty 1

## 2018-10-22 NOTE — ED Notes (Signed)
2nd grn top sent to lab.

## 2018-10-22 NOTE — Consult Note (Signed)
[place] for heparin Indication: chest pain/ACS  Allergies  Allergen Reactions  . Sulfa Antibiotics     Patient Measurements: Height: 5\' 2"  (157.5 cm) Weight: 165 lb (74.8 kg) IBW/kg (Calculated) : 50.1 Heparin Dosing Weight: 70.22 kg  Vital Signs: Temp: 98 F (36.7 C) (03/10 1559) Temp Source: Oral (03/10 1559) BP: 136/65 (03/10 1730) Pulse Rate: 100 (03/10 1730)  Labs: Recent Labs    10/21/2018 1605 11/05/2018 1710  HGB 9.1*  --   HCT 26.7*  --   PLT 329  --   CREATININE  --  1.53*  TROPONINI  --  0.73*    Estimated Creatinine Clearance: 23.6 mL/min (A) (by C-G formula based on SCr of 1.53 mg/dL (H)).   Medical History: Past Medical History:  Diagnosis Date  . Allergy   . Arthritis   . CKD (chronic kidney disease), stage III ([place])   . GERD (gastroesophageal reflux disease)   . Hyperlipidemia   . Hypertension   . Insomnia   . Stroke [facility])     Medications:  (Not in a hospital admission)  Scheduled:  . furosemide  60 mg Intravenous Once   Infusions:  . sodium chloride     PRN:   Assessment: Pharmacy was consulted to start heparin for ACS. No DOAC PTA noted. Likely a NSTEMI picture.    Goal of Therapy:  Heparin level 0.3-0.7 units/ml Monitor platelets by anticoagulation protocol: Yes   Plan:  Give 4000 units bolus x 1 Start heparin infusion at 800 units/hr Check anti-Xa level in 8 hours and daily while on heparin Continue to monitor H&H and platelets  Oswald Hillock, PharmD, BCPS 11/08/2018,6:15 PM

## 2018-10-22 NOTE — ED Notes (Signed)
ED TO INPATIENT HANDOFF REPORT  ED Nurse Name and Phone #: Metta Clines [phone]  S Name/Age/Gender [name] 85 y.o. female Room/Bed: ED12A/ED12A  Code Status   Code Status: Prior  Home/SNF/Other Home Patient oriented to: self, place, time and situation Is this baseline? Yes   Triage Complete: Triage complete  Chief Complaint Breathing Difficulties  Triage Note Ptin via EMS from home d/t SOB, inc WOB, desat to 73% RA since Sunday. EMS placed on 6L to get pt to 95%. 98.0 oral, 106 ST, 178/85, 176 BG, stroke hist 219, edema in legs new per EMS.    Allergies Allergies  Allergen Reactions  . Sulfa Antibiotics     Level of Care/Admitting Diagnosis ED Disposition    ED Disposition Condition Comment   Admit  Hospital Area: [facility] [[id number]]  Level of Care: Telemetry [5]  Diagnosis: NSTEMI (non-ST elevated myocardial infarction) (HCC) [358622]  Admitting Physician: PYREDDY, PAVAN [[id number]]  Attending Physician: PYREDDY, PAVAN [[id number]]  Estimated length of stay: past midnight tomorrow  Certification:: I certify this patient will need inpatient services for at least 2 midnights  PT Class (Do Not Modify): Inpatient [101]  PT Acc Code (Do Not Modify): Private [1]       B Medical/Surgery History Past Medical History:  Diagnosis Date  . Allergy   . Arthritis   . CKD (chronic kidney disease), stage III (HCC)   . GERD (gastroesophageal reflux disease)   . Hyperlipidemia   . Hypertension   . Insomnia   . Stroke (HCC)    Past Surgical History:  Procedure Laterality Date  . APPENDECTOMY    . BACK SURGERY    . CAROTID ENDARTERECTOMY Right   . CHOLECYSTECTOMY    . ENDARTERECTOMY Left 03/25/2018   Procedure: ENDARTERECTOMY CAROTID;  Surgeon: Dew, Jason S, MD;  Location: [facility] ORS;  Service: Vascular;  Laterality: Left;  . EYE SURGERY Bilateral   . FOOT SURGERY Left   . HERNIA REPAIR    . HIP SURGERY Left   . TONSILLECTOMY       A IV  Location/Drains/Wounds Patient Lines/Drains/Airways Status   Active Line/Drains/Airways    Name:   Placement date:   Placement time:   Site:   Days:   Peripheral IV 10/26/2018 Left Forearm   10/24/2018    1558    Forearm   less than 1   Incision (Closed) 03/25/18 Neck Left   03/25/18    1332     21 1          Intake/Output Last 24 hours No intake or output data in the 24 hours ending 10/31/2018 1853  Labs/Imaging Results for orders placed or performed during the hospital encounter of 10/27/2018 (from the past 48 hour(s))  CBC     Status: Abnormal   Collection Time: 10/21/2018  4:05 PM  Result Value Ref Range   WBC 13.0 (H) 4.0 - 10.5 K/uL   RBC 2.78 (L) 3.87 - 5.11 MIL/uL   Hemoglobin 9.1 (L) 12.0 - 15.0 g/dL   HCT 26.7 (L) 36.0 - 46.0 %   MCV 96.0 80.0 - 100.0 fL   MCH 32.7 26.0 - 34.0 pg   MCHC 34.1 30.0 - 36.0 g/dL   RDW 13.6 11.5 - 15.5 %   Platelets 329 150 - 400 K/uL   nRBC 0.2 0.0 - 0.2 %    Comment: Performed at [facility], [address] [place], [place] [address]  Brain natriuretic peptide  Status: Abnormal   Collection Time: 10/26/2018  4:05 PM  Result Value Ref Range   B Natriuretic Peptide 801.0 (H) 0.0 - 100.0 pg/mL    Comment: Performed at [facility], [address] [place], [place] [address]  Comprehensive metabolic panel     Status: Abnormal   Collection Time: 10/27/2018  5:10 PM  Result Value Ref Range   Sodium 126 (L) 135 - 145 mmol/L   Potassium 4.9 3.5 - 5.1 mmol/L   Chloride 95 (L) 98 - 111 mmol/L   CO2 20 (L) 22 - 32 mmol/L   Glucose, Bld 141 (H) 70 - 99 mg/dL   BUN 35 (H) 8 - 23 mg/dL   Creatinine, Ser 1.53 (H) 0.44 - 1.00 mg/dL   Calcium 8.8 (L) 8.9 - 10.3 mg/dL   Total Protein 6.9 6.5 - 8.1 g/dL   Albumin 3.3 (L) 3.5 - 5.0 g/dL   AST 52 (H) 15 - 41 U/L   ALT 42 0 - 44 U/L   Alkaline Phosphatase 86 38 - 126 U/L   Total Bilirubin 0.7 0.3 - 1.2 mg/dL   GFR calc non Af Amer 30 (L) >60 mL/min   GFR calc Af Amer 35 (L) >60 mL/min    Anion gap 11 5 - 15    Comment: Performed at [facility], [address] [place], [place] [address]  Troponin I -     Status: Abnormal   Collection Time: 11/08/2018  5:10 PM  Result Value Ref Range   Troponin I 0.73 (HH) <0.03 ng/mL    Comment: CRITICAL RESULT CALLED TO, READ BACK BY AND VERIFIED WITH: GEPRGIE Grasiela Jonsson @ 0814 ON 10/19/2018 BY JUW Performed at [facility], [address] [place], [place] [address]    Dg Chest 2 View  Result Date: 11/04/2018 CLINICAL DATA:  Initial evaluation for acute shortness of breath, hypoxia. EXAM: CHEST - 2 VIEW COMPARISON:  Prior radiograph from 03/17/2018. FINDINGS: Cardiomegaly, similar to previous. Mediastinal silhouette within normal limits. Aortic atherosclerosis. Lungs are hypoinflated. Moderate layering bilateral pleural effusions. Prominent perihilar vascular congestion with bilateral airspace opacities, most likely reflecting moderate diffuse pulmonary edema. Superimposed infection would be difficult to exclude. No pneumothorax. No acute osseous finding. Diffuse spurring noted within the thoracolumbar spine. Prominent degenerative changes noted about the shoulders. IMPRESSION: 1. Cardiomegaly with bilateral pleural effusions and extensive bilateral airspace opacities, likely pulmonary edema. Superimposed infection would be difficult to exclude, and could be considered in the correct clinical setting. 2. Aortic atherosclerosis Electronically Signed   By: Jeannine Boga M.D.   On: 11/08/2018 16:42    Pending Labs Unresulted Labs (From admission, onward)    Start     Ordered   10/23/18 0300  Heparin level (unfractionated)  Once-Timed,   STAT     10/31/2018 1821   10/27/2018 1822  APTT  ONCE - STAT,   STAT     10/21/2018 1821   11/09/2018 1822  Protime-INR  ONCE - STAT,   STAT     10/30/2018 1821   Signed and Held  Troponin I - Now Then Q6H  Now then every 6 hours,   STAT     Signed and Held   Signed and Held  Basic metabolic  panel  Tomorrow morning,   R     Signed and Held   Signed and Held  Lipid panel  Tomorrow morning,   R     Signed and Held   Signed and Held  CBC  Tomorrow morning,   R     Signed and Held          Vitals/Pain Today's Vitals   11/02/2018 1830 10/30/2018 1847 10/29/2018 1848 11/02/2018 1851  BP: (!) 89/78 (!) 122/59    Pulse: (!) 101   (!) 104  Resp: (!) 24  (!) 22   Temp:      TempSrc:      SpO2: 96%   95%  Weight:      Height:      PainSc:        Isolation Precautions No active isolations  Medications Medications  heparin ADULT infusion 100 units/mL (25000 units/212mL sodium chloride 0.45%) (800 Units/hr Intravenous New Bag/Given 10/21/2018 1842)  albuterol (PROVENTIL) (2.5 MG/3ML) 0.083% nebulizer solution 5 mg (5 mg Nebulization Given 10/25/2018 1702)  furosemide (LASIX) injection 60 mg (60 mg Intravenous Given 10/17/2018 1843)  heparin bolus via infusion 4,000 Units (4,000 Units Intravenous Bolus from Bag 10/30/2018 1842)    Mobility walks with device Moderate fall risk   Focused Assessments Pulmonary Assessment Handoff:  Lung sounds: Bilateral Breath Sounds: Coarse crackles          R Recommendations: See Admitting Provider Note  Report given to:   Additional Notes:  Heparin gtt 4000unit bolus, now running at 8; crackles in lower lobes bilat; A&Ox4; family at bedside; wants CPR/meds but NOT intubation; BP has been on-off soft but Hospitalist verb okay to give full 60mg  Lasix.

## 2018-10-22 NOTE — ED Notes (Signed)
Urine sample sent to lab

## 2018-10-22 NOTE — ED Notes (Signed)
Floor RN to call this RN back with any questions once handoff reviewed.

## 2018-10-22 NOTE — ED Notes (Signed)
Pt on HFNC at 6L now 94%.

## 2018-10-22 NOTE — ED Provider Notes (Signed)
[facility] Emergency Department Provider Note  Time seen: 4:48 PM  I have reviewed the triage vital signs and the nursing notes.   HISTORY  Chief Complaint Shortness of Breath   HPI [name] is a 85 y.o. female with a past medical history of arthritis, CKD, gastric reflux, hypertension, hyperlipidemia, CVA, presents to the emergency department for shortness of breath and cough.  According to the patient over the past 5 days she has been feeling progressively more short of breath with a cough.  Denies sputum production.  No measured fever at home.  Also states increased lower extremity edema over the past 2 to 3 days. No chest pain.    Past Medical History:  Diagnosis Date  . Allergy   . Arthritis   . CKD (chronic kidney disease), stage III ([place])   . GERD (gastroesophageal reflux disease)   . Hyperlipidemia   . Hypertension   . Insomnia   . Stroke [facility])     Patient Active Problem List   Diagnosis Date Noted  . Acute non-recurrent pansinusitis 08/27/2018  . Allergic conjunctivitis of both eyes 07/23/2018  . Primary insomnia 07/23/2018  . Encounter for general adult medical examination with abnormal findings 07/23/2018  . Carotid stenosis, left 03/25/2018  . Loss of consciousness ([place]) 03/17/2018  . Acute pain of left knee 12/05/2017  . Urinary tract infection with hematuria 12/05/2017  . Dysuria 12/05/2017  . Atopic dermatitis 12/05/2017  . Hypothyroidism, unspecified 08/21/2017  . Peripheral vascular disease, unspecified ([place]) 08/21/2017  . Generalized weakness 08/21/2017  . Type 2 diabetes mellitus without complications ([place]) 16/08/930  . Restless leg syndrome 08/21/2017  . Cardiac arrhythmia, unspecified 08/21/2017  . Primary generalized (osteo)arthritis 08/21/2017  . Vitamin B12 deficiency anemia, unspecified 08/21/2017  . Other obesity due to excess calories 08/21/2017  . Chronic kidney disease, unspecified 08/21/2017  .  Gastro-esophageal reflux disease without esophagitis 08/21/2017  . Hyperkalemia 08/21/2017  . Shortness of breath 08/21/2017  . Wheezing 08/21/2017  . Nausea 08/21/2017  . Allergic rhinitis due to pollen 08/21/2017  . Acute recurrent sinusitis, unspecified 08/21/2017  . Cerebrovascular disease, unspecified 08/21/2017  . Occlusion and stenosis of left carotid artery 08/21/2017  . Gout, unspecified 08/21/2017  . Essential hypertension, benign 01/02/2017  . Hyperlipidemia 06/20/2016  . Carotid stenosis 06/20/2016    Past Surgical History:  Procedure Laterality Date  . APPENDECTOMY    . BACK SURGERY    . CAROTID ENDARTERECTOMY Right   . CHOLECYSTECTOMY    . ENDARTERECTOMY Left 03/25/2018   Procedure: ENDARTERECTOMY CAROTID;  Surgeon: Algernon Huxley, MD;  Location: [facility] ORS;  Service: Vascular;  Laterality: Left;  . EYE SURGERY Bilateral   . FOOT SURGERY Left   . HERNIA REPAIR    . HIP SURGERY Left   . TONSILLECTOMY      Prior to Admission medications   Medication Sig Start Date End Date Taking? Authorizing Provider  allopurinol (ZYLOPRIM) 100 MG tablet take 2 tablets by mouth at bedtime for gout 05/28/18   Ronnell Freshwater, NP  ALPRAZolam Duanne Moron) 0.25 MG tablet Take 1 tablet (0.25 mg total) by mouth at bedtime as needed for anxiety. 07/23/18   Ronnell Freshwater, NP  amLODipine (NORVASC) 2.5 MG tablet take 1 tablet by mouth at lunch 05/07/18   Ronnell Freshwater, NP  amoxicillin (AMOXIL) 875 MG tablet Take 1 tablet (875 mg total) by mouth 2 (two) times daily. 08/27/18   Ronnell Freshwater, NP  aspirin EC  81 MG EC tablet Take 1 tablet (81 mg total) by mouth daily. 03/30/18   Algernon Huxley, MD  benazepril (LOTENSIN) 5 MG tablet Take 1 tablet (5 mg total) by mouth 2 (two) times daily. 05/28/18   Ronnell Freshwater, NP  Cholecalciferol (VITAMIN D3) 2000 units capsule Take 2,000 Units by mouth daily.     [provider]  cloNIDine (CATAPRES - DOSED IN MG/24 HR) 0.2 mg/24hr patch apply 1  patch every week 09/12/18   Ronnell Freshwater, NP  clopidogrel (PLAVIX) 75 MG tablet Take 1 tablet (75 mg total) by mouth daily with breakfast. 09/02/18   Ronnell Freshwater, NP  conjugated estrogens (PREMARIN) vaginal cream Place 1 Applicatorful vaginally once a week. At night.    [provider]  cyanocobalamin (,VITAMIN B-12,) 1000 MCG/ML injection INJECT 1 MILLILITER INTRAMUSCULARLY ONCE A MONTH 10/17/18   Ronnell Freshwater, NP  estrogens, conjugated, (PREMARIN) 0.625 MG tablet Take 0.625 mg by mouth daily. Take daily for 21 days then do not take for 7 days.    [provider]  FEROSUL 325 (65 Fe) MG tablet take 1 tablet by mouth twice a day 11/12/17   Ronnell Freshwater, NP  fluticasone (FLONASE) 50 MCG/ACT nasal spray Place 1 spray into both nostrils daily as needed for allergies.     [provider]  Multiple Vitamins-Minerals (CENTRUM SILVER 50+WOMEN) TABS Take 1 tablet by mouth daily.    [provider]  naproxen (NAPROSYN) 250 MG tablet Take by mouth 2 (two) times daily with a meal.    [provider]  Olopatadine HCl 0.2 % SOLN Apply 1 drop to eye daily. 07/23/18   Ronnell Freshwater, NP  omeprazole (PRILOSEC) 20 MG capsule take 1 capsule by mouth once daily FOR BELCHING AND STOMACH. 10/11/18   Ronnell Freshwater, NP  pravastatin (PRAVACHOL) 20 MG tablet Take 1 tablet (20 mg total) by mouth daily. 09/12/18   Ronnell Freshwater, NP  triamcinolone (KENALOG) 0.025 % cream Apply 1 application topically 2 (two) times daily. 11/09/17   Ronnell Freshwater, NP    Allergies  Allergen Reactions  . Sulfa Antibiotics     Family History  Problem Relation Age of Onset  . Congestive Heart Failure Mother   . Heart disease Mother   . Heart disease Father   . Diabetes Brother     Social History Social History   Tobacco Use  . Smoking status: Never Smoker  . Smokeless tobacco: Never Used  Substance Use Topics  . Alcohol use: No  . Drug use: No    Review of  Systems Constitutional: No known fever ENT: Negative for recent illness/congestion Cardiovascular: Negative for chest pain. Respiratory: Positive for shortness of breath.  Positive for cough. Gastrointestinal: Negative for abdominal pain Genitourinary: Negative for urinary compaints Musculoskeletal: Increased lower extreme edema x2 days. Skin: Negative for skin complaints  Neurological: Negative for headache All other ROS negative  ____________________________________________   PHYSICAL EXAM:  VITAL SIGNS: ED Triage Vitals  Enc Vitals Group     BP 11/04/2018 1559 (!) 158/57     Pulse Rate 11/09/2018 1559 (!) 101     Resp 10/16/2018 1600 18     Temp 10/25/2018 1559 98 F (36.7 C)     Temp Source 10/24/2018 1559 Oral     SpO2 11/01/2018 1607 97 %     Weight 11/11/2018 1601 165 lb (74.8 kg)     Height 11/08/2018 1601 5\' 2"  (1.575 m)  Head Circumference --      Peak Flow --      Pain Score 10/15/2018 1600 5     Pain Loc --      Pain Edu? --      Excl. in [place]? --    Constitutional: Alert and oriented. Well appearing and in no distress. Eyes: Normal exam ENT   Head: Normocephalic and atraumatic.   Mouth/Throat: Mucous membranes are moist. Cardiovascular: Normal rate, regular rhythm.  Respiratory: Normal respiratory effort without tachypnea nor retractions. Breath sounds are clear  Gastrointestinal: Soft and nontender. No distention.  Musculoskeletal: Nontender with normal range of motion in all extremities.  Neurologic:  Normal speech and language. No gross focal neurologic deficits  Skin:  Skin is warm, dry and intact.  Psychiatric: Mood and affect are normal.   ____________________________________________    EKG  EKG viewed and interpreted by myself shows what appears to be a sinus rhythm at 101 bpm with a narrow QRS, normal axis, normal intervals, besides possible prolonged PR interval versus U wave, no concerning ST changes.  ____________________________________________     RADIOLOGY  X-ray consistent with cardiomegaly with extensive airspace opacities likely pulmonary edema.  ____________________________________________   INITIAL IMPRESSION / ASSESSMENT AND PLAN / ED COURSE  Pertinent labs & imaging results that were available during my care of the patient were reviewed by me and considered in my medical decision making (see chart for details).  Patient presents to the emergency department for shortness of breath cough increased lower extremity edema.  Differential this time would include CHF, pneumonia, URI/viral infection, ACS.  We will check labs including troponin and BNP obtain a chest x-ray and continue to closely monitor.  Patient has no baseline O2 requirement currently requiring 5 L nasal cannula to maintain sats in the mid 90s.  X-ray most consistent with pulmonary edema.  Patient BNP elevated to 801.  Troponin 0.76 likely indicating NSTEMI.  We will start the patient on heparin infusion and admit to the hospitalist service.  We will begin treatment with Lasix as well.   CRITICAL CARE Performed by: Harvest Dark   Total critical care time: 30 minutes  Critical care time was exclusive of separately billable procedures and treating other patients.  Critical care was necessary to treat or prevent imminent or life-threatening deterioration.  Critical care was time spent personally by me on the following activities: development of treatment plan with patient and/or surrogate as well as nursing, discussions with consultants, evaluation of patient's response to treatment, examination of patient, obtaining history from patient or surrogate, ordering and performing treatments and interventions, ordering and review of laboratory studies, ordering and review of radiographic studies, pulse oximetry and re-evaluation of patient's condition.   ____________________________________________   FINAL CLINICAL IMPRESSION(S) / ED  DIAGNOSES  Hypoxia CHF Pulmonary edema NSTEMI   Harvest Dark, MD 10/26/2018 6629

## 2018-10-22 NOTE — H&P (Signed)
[place] at [place] NAME: [name]    MR#:  [id number]  DATE OF BIRTH:  [birth year]  DATE OF ADMISSION:  10/27/2018  PRIMARY CARE PHYSICIAN: Lavera Guise, MD   REQUESTING/REFERRING PHYSICIAN:   CHIEF COMPLAINT:   Chief Complaint  Patient presents with  . Shortness of Breath    HISTORY OF PRESENT ILLNESS: [name]  is a 85 y.o. female with a known history of CKD stage III, hyperlipidemia, hypertension, CVA in the past, arthritis presented to the emergency room for shortness of breath.  Patient also is has swelling in the lower extremities left leg more than right leg.  She feels more short of breath and is orthopneic.  No complaints of any chest pain per se.  Troponin is 0.7.  Appears to be in heart failure with demand ischemia versus non-STEMI.  Never seen any cardiologist in the past.  Patient was given IV Lasix 60 MG in the emergency room.  Hospitalist service was consulted.  PAST MEDICAL HISTORY:   Past Medical History:  Diagnosis Date  . Allergy   . Arthritis   . CKD (chronic kidney disease), stage III ([place])   . GERD (gastroesophageal reflux disease)   . Hyperlipidemia   . Hypertension   . Insomnia   . Stroke [facility])     PAST SURGICAL HISTORY:  Past Surgical History:  Procedure Laterality Date  . APPENDECTOMY    . BACK SURGERY    . CAROTID ENDARTERECTOMY Right   . CHOLECYSTECTOMY    . ENDARTERECTOMY Left 03/25/2018   Procedure: ENDARTERECTOMY CAROTID;  Surgeon: Algernon Huxley, MD;  Location: [facility] ORS;  Service: Vascular;  Laterality: Left;  . EYE SURGERY Bilateral   . FOOT SURGERY Left   . HERNIA REPAIR    . HIP SURGERY Left   . TONSILLECTOMY      SOCIAL HISTORY:  Social History   Tobacco Use  . Smoking status: Never Smoker  . Smokeless tobacco: Never Used  Substance Use Topics  . Alcohol use: No    FAMILY HISTORY:  Family History  Problem Relation Age of Onset  . Congestive Heart Failure Mother    . Heart disease Mother   . Heart disease Father   . Diabetes Brother     DRUG ALLERGIES:  Allergies  Allergen Reactions  . Sulfa Antibiotics     REVIEW OF SYSTEMS:   CONSTITUTIONAL: No fever, fatigue or weakness.  EYES: No blurred or double vision.  EARS, NOSE, AND THROAT: No tinnitus or ear pain.  RESPIRATORY: No cough,has shortness of breath, no wheezing or hemoptysis.  CARDIOVASCULAR: No chest pain, has orthopnea, edema.  GASTROINTESTINAL: No nausea, vomiting, diarrhea or abdominal pain.  GENITOURINARY: No dysuria, hematuria.  ENDOCRINE: No polyuria, nocturia,  HEMATOLOGY: No anemia, easy bruising or bleeding SKIN: No rash or lesion. MUSCULOSKELETAL: swelling in the legs NEUROLOGIC: No tingling, numbness, weakness.  PSYCHIATRY: No anxiety or depression.   MEDICATIONS AT HOME:  Prior to Admission medications   Medication Sig Start Date End Date Taking? Authorizing Provider  allopurinol (ZYLOPRIM) 100 MG tablet take 2 tablets by mouth at bedtime for gout 05/28/18  Yes Ronnell Freshwater, NP  ALPRAZolam (XANAX) 0.25 MG tablet Take 1 tablet (0.25 mg total) by mouth at bedtime as needed for anxiety. 07/23/18  Yes Ronnell Freshwater, NP  amLODipine (NORVASC) 2.5 MG tablet take 1 tablet by mouth at lunch 05/07/18  Yes Boscia, Greer Ee, NP  aspirin EC  81 MG EC tablet Take 1 tablet (81 mg total) by mouth daily. 03/30/18  Yes Dew, Erskine Squibb, MD  benazepril (LOTENSIN) 5 MG tablet Take 1 tablet (5 mg total) by mouth 2 (two) times daily. 05/28/18  Yes Ronnell Freshwater, NP  Cholecalciferol (VITAMIN D3) 2000 units capsule Take 2,000 Units by mouth daily.    Yes [provider]  cloNIDine (CATAPRES - DOSED IN MG/24 HR) 0.2 mg/24hr patch apply 1 patch every week 09/12/18  Yes Boscia, Greer Ee, NP  clopidogrel (PLAVIX) 75 MG tablet Take 1 tablet (75 mg total) by mouth daily with breakfast. 09/02/18  Yes Boscia, Heather E, NP  conjugated estrogens (PREMARIN) vaginal cream Place 1  Applicatorful vaginally once a week. At night.   Yes [provider]  cyanocobalamin (,VITAMIN B-12,) 1000 MCG/ML injection INJECT 1 MILLILITER INTRAMUSCULARLY ONCE A MONTH 10/17/18  Yes Boscia, Heather E, NP  diclofenac sodium (VOLTAREN) 1 % GEL Apply 2 g topically 2 (two) times daily. 10/21/18  Yes [provider]  estrogens, conjugated, (PREMARIN) 0.625 MG tablet Take 0.625 mg by mouth daily. Take daily for 21 days then do not take for 7 days.   Yes [provider]  FEROSUL 325 (65 Fe) MG tablet take 1 tablet by mouth twice a day Patient taking differently: Take 325 mg by mouth daily.  11/12/17  Yes Boscia, Greer Ee, NP  Multiple Vitamins-Minerals (CENTRUM SILVER 50+WOMEN) TABS Take 1 tablet by mouth daily.   Yes [provider]  omeprazole (PRILOSEC) 20 MG capsule take 1 capsule by mouth once daily FOR BELCHING AND STOMACH. 10/11/18  Yes Boscia, Heather E, NP  pravastatin (PRAVACHOL) 20 MG tablet Take 1 tablet (20 mg total) by mouth daily. 09/12/18  Yes Boscia, Heather E, NP  triamcinolone (KENALOG) 0.025 % cream Apply 1 application topically 2 (two) times daily. 11/09/17  Yes Boscia, Heather E, NP  Olopatadine HCl 0.2 % SOLN Apply 1 drop to eye daily. 07/23/18   Ronnell Freshwater, NP      PHYSICAL EXAMINATION:   VITAL SIGNS: Blood pressure (!) 122/59, pulse (!) 104, temperature 98 F (36.7 C), temperature source Oral, resp. rate (!) 22, height 5\' 2"  (1.575 m), weight 74.8 kg, SpO2 95 %.  GENERAL:  85 y.o.-year-old patient lying in the bed with no acute distress.  EYES: Pupils equal, round, reactive to light and accommodation. No scleral icterus. Extraocular muscles intact.  HEENT: Head atraumatic, normocephalic. Oropharynx and nasopharynx clear.  NECK:  Supple, no jugular venous distention. No thyroid enlargement, no tenderness.  LUNGS: Decreased breath sounds bilaterally,bibasilar crepitations heard. No use of accessory muscles of respiration.   CARDIOVASCULAR: S1, S2 normal. No murmurs, rubs, or gallops.  ABDOMEN: Soft, nontender, nondistended. Bowel sounds present. No organomegaly or mass.  EXTREMITIES: Has left leg edema greater than right lower extremity No cyanosis, or clubbing.  NEUROLOGIC: Cranial nerves II through XII are intact. Muscle strength 5/5 in all extremities. Sensation intact. Gait not checked.  PSYCHIATRIC: The patient is alert and oriented x 3.  SKIN: No obvious rash, lesion, or ulcer.   LABORATORY PANEL:   CBC Recent Labs  Lab 10/29/2018 1605  WBC 13.0*  HGB 9.1*  HCT 26.7*  PLT 329  MCV 96.0  MCH 32.7  MCHC 34.1  RDW 13.6   ------------------------------------------------------------------------------------------------------------------  Chemistries  Recent Labs  Lab 11/02/2018 1710  NA 126*  K 4.9  CL 95*  CO2 20*  GLUCOSE 141*  BUN 35*  CREATININE 1.53*  CALCIUM  8.8*  AST 52*  ALT 42  ALKPHOS 86  BILITOT 0.7   ------------------------------------------------------------------------------------------------------------------ estimated creatinine clearance is 23.6 mL/min (A) (by C-G formula based on SCr of 1.53 mg/dL (H)). ------------------------------------------------------------------------------------------------------------------ No results for input(s): TSH, T4TOTAL, T3FREE, THYROIDAB in the last 72 hours.  Invalid input(s): FREET3   Coagulation profile No results for input(s): INR, PROTIME in the last 168 hours. ------------------------------------------------------------------------------------------------------------------- No results for input(s): DDIMER in the last 72 hours. -------------------------------------------------------------------------------------------------------------------  Cardiac Enzymes Recent Labs  Lab 10/15/2018 1710  TROPONINI 0.73*    ------------------------------------------------------------------------------------------------------------------ Invalid input(s): POCBNP  ---------------------------------------------------------------------------------------------------------------  Urinalysis    Component Value Date/Time   COLORURINE YELLOW (A) 03/18/2018 1122   APPEARANCEUR Clear 08/27/2018 0843   LABSPEC 1.009 03/18/2018 1122   LABSPEC 1.011 04/05/2014 2033   PHURINE 6.0 03/18/2018 1122   GLUCOSEU Negative 08/27/2018 0843   GLUCOSEU Negative 04/05/2014 2033   HGBUR NEGATIVE 03/18/2018 1122   BILIRUBINUR Negative 08/27/2018 0843   BILIRUBINUR Negative 04/05/2014 2033   KETONESUR NEGATIVE 03/18/2018 1122   PROTEINUR 2+ (A) 08/27/2018 0843   PROTEINUR 30 (A) 03/18/2018 1122   UROBILINOGEN 0.2 07/23/2018 0954   NITRITE Negative 08/27/2018 0843   NITRITE NEGATIVE 03/18/2018 1122   LEUKOCYTESUR 1+ (A) 08/27/2018 0843   LEUKOCYTESUR Trace 04/05/2014 2033     RADIOLOGY: Dg Chest 2 View  Result Date: 11/03/2018 CLINICAL DATA:  Initial evaluation for acute shortness of breath, hypoxia. EXAM: CHEST - 2 VIEW COMPARISON:  Prior radiograph from 03/17/2018. FINDINGS: Cardiomegaly, similar to previous. Mediastinal silhouette within normal limits. Aortic atherosclerosis. Lungs are hypoinflated. Moderate layering bilateral pleural effusions. Prominent perihilar vascular congestion with bilateral airspace opacities, most likely reflecting moderate diffuse pulmonary edema. Superimposed infection would be difficult to exclude. No pneumothorax. No acute osseous finding. Diffuse spurring noted within the thoracolumbar spine. Prominent degenerative changes noted about the shoulders. IMPRESSION: 1. Cardiomegaly with bilateral pleural effusions and extensive bilateral airspace opacities, likely pulmonary edema. Superimposed infection would be difficult to exclude, and could be considered in the correct clinical setting. 2. Aortic  atherosclerosis Electronically Signed   By: Jeannine Boga M.D.   On: 10/23/2018 16:42    EKG: Orders placed or performed during the hospital encounter of 10/23/2018  . ED EKG  . ED EKG    IMPRESSION AND PLAN: 85 year old with a known history of CKD stage III, hyperlipidemia, hypertension, CVA in the past, arthritis presented to the emergency room for shortness of breath.   -Acute new onset congestive heart failure Diurese patient with IV Lasix Start patient on ACE inhibitor and beta-blocker Cardiology consult and echocardiogram Cycle troponin to assess for ischemia Telemetry monitoring Inpatient admission Input output chart and daily body weights  -Elevated troponin Non-STEMI versus demand ischemia from heart failure IV heparin drip for anticoagulation for now Reassess in a.m. Cardiology evaluation with [facility] MD Trend troponin Continue aspirin  -Hyponatremia Monitor sodium levels Currently on dialysis  -CKD stage III Monitor renal function  -Hyperlipidemia Statin medication on board  -DVT prophylaxis On anticoagulation with heparin drip  All the records are reviewed and case discussed with ED provider. Management plans discussed with the patient, family and they are in agreement.  CODE STATUS:Parial code Code Status History    Date Active Date Inactive Code Status Order ID Comments User Context   03/25/2018 1702 03/29/2018 2253 Full Code [id number]  Algernon Huxley, MD Inpatient   03/17/2018 1727 03/19/2018 1905 DNR [id number]  Vaughan Basta, MD Inpatient    Advance Directive Documentation  Most Recent Value  Type of Advance Directive  Healthcare Power of Attorney  Pre-existing out of facility DNR order (yellow form or pink MOST form)  -  "MOST" Form in Place?  -     TOTAL TIME TAKING CARE OF THIS PATIENT: 53 minutes.    Saundra Shelling M.D on 10/21/2018 at 7:15 PM  Between 7am to 6pm - Pager - [phone]  After 6pm go to [email or web address] - password  [email or web address]  [facility]  Office  [phone]  CC: Primary care physician; Lavera Guise, MD

## 2018-10-22 NOTE — ED Notes (Addendum)
Pt repositioned in bed. Denies pain. Pt remains on 6L HFNC.

## 2018-10-22 NOTE — ED Notes (Signed)
Attempted report to 2A. Staff says they're stat cleaning a room closer to the nurses station.

## 2018-10-22 NOTE — ED Triage Notes (Signed)
Ptin via EMS from home d/t SOB, inc WOB, desat to 73% RA since Sunday. EMS placed on 6L to get pt to 95%. 98.0 oral, 106 ST, 178/85, 176 BG, stroke hist 219, edema in legs new per EMS.

## 2018-10-22 NOTE — ED Notes (Addendum)
Pt receiving neb through aerosol mask. Family at bedside.

## 2018-10-22 NOTE — ED Notes (Signed)
Pt leaving for imaging now.

## 2018-10-22 NOTE — ED Notes (Signed)
EDP Paduchowski notified of Trop 0.73.

## 2018-10-22 NOTE — ED Notes (Signed)
Pt assisted to bedside toilet. Uses walker at home. Urinated 450. Peri care by pt. Bed pads changed.

## 2018-10-23 ENCOUNTER — Ambulatory Visit: Payer: Self-pay | Admitting: Nurse Practitioner

## 2018-10-23 ENCOUNTER — Other Ambulatory Visit: Payer: Medicare HMO

## 2018-10-23 ENCOUNTER — Inpatient Hospital Stay (HOSPITAL_COMMUNITY)
Admit: 2018-10-23 | Discharge: 2018-10-23 | Disposition: A | Discharge status: Home / Self Care | Payer: Medicare HMO | Attending: Cardiovascular Disease | Admitting: Cardiovascular Disease

## 2018-10-23 ENCOUNTER — Encounter: Payer: Self-pay | Admitting: Nurse Practitioner

## 2018-10-23 ENCOUNTER — Inpatient Hospital Stay: Payer: Medicare HMO

## 2018-10-23 DIAGNOSIS — R0902 Hypoxemia: Secondary | ICD-10-CM

## 2018-10-23 DIAGNOSIS — J209 Acute bronchitis, unspecified: Secondary | ICD-10-CM

## 2018-10-23 DIAGNOSIS — J810 Acute pulmonary edema: Secondary | ICD-10-CM

## 2018-10-23 DIAGNOSIS — I340 Nonrheumatic mitral (valve) insufficiency: Secondary | ICD-10-CM

## 2018-10-23 DIAGNOSIS — I214 Non-ST elevation (NSTEMI) myocardial infarction: Secondary | ICD-10-CM

## 2018-10-23 DIAGNOSIS — I361 Nonrheumatic tricuspid (valve) insufficiency: Secondary | ICD-10-CM

## 2018-10-23 DIAGNOSIS — E871 Hypo-osmolality and hyponatremia: Secondary | ICD-10-CM

## 2018-10-23 DIAGNOSIS — I739 Peripheral vascular disease, unspecified: Secondary | ICD-10-CM

## 2018-10-23 LAB — BASIC METABOLIC PANEL
Anion gap: 12 (ref 5–15)
BUN: 36 mg/dL — ABNORMAL HIGH (ref 8–23)
CHLORIDE: 96 mmol/L — AB (ref 98–111)
CO2: 22 mmol/L (ref 22–32)
CREATININE: 1.59 mg/dL — AB (ref 0.44–1.00)
Calcium: 9.1 mg/dL (ref 8.9–10.3)
GFR calc Af Amer: 33 mL/min — ABNORMAL LOW (ref >60)
GFR calc non Af Amer: 28 mL/min — ABNORMAL LOW (ref >60)
Glucose, Bld: 136 mg/dL — ABNORMAL HIGH (ref 70–99)
Potassium: 4.5 mmol/L (ref 3.5–5.1)
Sodium: 130 mmol/L — ABNORMAL LOW (ref 135–145)

## 2018-10-23 LAB — CBC
HCT: 24.8 % — ABNORMAL LOW (ref 36.0–46.0)
Hemoglobin: 8.4 g/dL — ABNORMAL LOW (ref 12.0–15.0)
MCH: 32.3 pg (ref 26.0–34.0)
MCHC: 33.9 g/dL (ref 30.0–36.0)
MCV: 95.4 fL (ref 80.0–100.0)
Platelets: 287 10*3/uL (ref 150–400)
RBC: 2.6 MIL/uL — ABNORMAL LOW (ref 3.87–5.11)
RDW: 13.5 % (ref 11.5–15.5)
WBC: 17 10*3/uL — ABNORMAL HIGH (ref 4.0–10.5)
nRBC: 0 % (ref 0.0–0.2)

## 2018-10-23 LAB — EXPECTORATED SPUTUM ASSESSMENT W GRAM STAIN, RFLX TO RESP C: Special Requests: NORMAL

## 2018-10-23 LAB — APTT
aPTT: 81 seconds — ABNORMAL HIGH (ref 24–36)
aPTT: 121 seconds — ABNORMAL HIGH (ref 24–36)

## 2018-10-23 LAB — PROTIME-INR
INR: 1.1 (ref 0.8–1.2)
INR: 1.1 (ref 0.8–1.2)
Prothrombin Time: 14.3 seconds (ref 11.4–15.2)
Prothrombin Time: 14.4 seconds (ref 11.4–15.2)

## 2018-10-23 LAB — TROPONIN I
Troponin I: 0.93 ng/mL (ref <0.03)
Troponin I: 1.14 ng/mL (ref <0.03)

## 2018-10-23 LAB — LIPID PANEL
Cholesterol: 131 mg/dL (ref 0–200)
HDL: 48 mg/dL (ref >40)
LDL Cholesterol: 74 mg/dL (ref 0–99)
Total CHOL/HDL Ratio: 2.7 RATIO
Triglycerides: 45 mg/dL (ref <150)
VLDL: 9 mg/dL (ref 0–40)

## 2018-10-23 LAB — ECHOCARDIOGRAM COMPLETE
Height: 62 in
Weight: 2768 oz

## 2018-10-23 LAB — HEPARIN LEVEL (UNFRACTIONATED)
Heparin Unfractionated: 0.33 IU/mL (ref 0.30–0.70)
Heparin Unfractionated: 0.23 IU/mL — ABNORMAL LOW (ref 0.30–0.70)
Heparin Unfractionated: 0.29 IU/mL — ABNORMAL LOW (ref 0.30–0.70)

## 2018-10-23 LAB — PROCALCITONIN: PROCALCITONIN: 0.18 ng/mL

## 2018-10-23 MED ORDER — NITROGLYCERIN 2 % TD OINT
0.5000 [in_us] | TOPICAL_OINTMENT | Freq: Four times a day (QID) | TRANSDERMAL | Status: DC
  Administered 2018-10-23 – 2018-10-26 (×6): 0.5 [in_us] via TOPICAL
  Filled 2018-10-23 (×7): qty 1

## 2018-10-23 MED ORDER — AZITHROMYCIN 250 MG PO TABS
500.0000 mg | ORAL_TABLET | Freq: Every day | ORAL | Status: AC
  Administered 2018-10-23 – 2018-10-25 (×3): 500 mg via ORAL
  Filled 2018-10-23 (×3): qty 2

## 2018-10-23 MED ORDER — ATORVASTATIN CALCIUM 20 MG PO TABS
40.0000 mg | ORAL_TABLET | Freq: Every day | ORAL | Status: DC
  Administered 2018-10-23 – 2018-10-26 (×4): 40 mg via ORAL
  Filled 2018-10-23 (×4): qty 2

## 2018-10-23 MED ORDER — ORAL CARE MOUTH RINSE
15.0000 mL | Freq: Two times a day (BID) | OROMUCOSAL | Status: DC
  Administered 2018-10-23 – 2018-10-27 (×3): 15 mL via OROMUCOSAL

## 2018-10-23 MED ORDER — SODIUM CHLORIDE 0.9 % IV SOLN
1.0000 g | Freq: Every day | INTRAVENOUS | Status: DC
  Administered 2018-10-23 – 2018-10-24 (×2): 1 g via INTRAVENOUS
  Filled 2018-10-23 (×2): qty 1

## 2018-10-23 MED ORDER — HEPARIN BOLUS VIA INFUSION
1000.0000 [IU] | Freq: Once | INTRAVENOUS | Status: AC
  Administered 2018-10-23: 1000 [IU] via INTRAVENOUS
  Filled 2018-10-23: qty 1000

## 2018-10-23 NOTE — Progress Notes (Signed)
[place] at [place] NAME: [name]    MR#:  [id number]  DATE OF BIRTH:  Jun 27, 1927  SUBJECTIVE:  CHIEF COMPLAINT:   Chief Complaint  Patient presents with  . Shortness of Breath   -Complains of shortness of breath for few weeks now. -Drinks plenty of fluids as outpatient.  Also complaining of productive cough. -Acutely on oxygen.  REVIEW OF SYSTEMS:  Review of Systems  Constitutional: Positive for chills and malaise/fatigue. Negative for fever.  HENT: Negative for congestion, ear discharge, hearing loss and nosebleeds.   Eyes: Negative for blurred vision and double vision.  Respiratory: Positive for cough and shortness of breath. Negative for wheezing.   Cardiovascular: Positive for orthopnea and leg swelling. Negative for chest pain and palpitations.  Gastrointestinal: Negative for abdominal pain, constipation, diarrhea, nausea and vomiting.  Genitourinary: Negative for dysuria.  Musculoskeletal: Negative for myalgias.  Neurological: Negative for dizziness, focal weakness, seizures, weakness and headaches.  Psychiatric/Behavioral: Negative for depression.    DRUG ALLERGIES:   Allergies  Allergen Reactions  . Sulfa Antibiotics     VITALS:  Blood pressure 127/68, pulse 86, temperature 98.1 F (36.7 C), temperature source Oral, resp. rate 20, height 5\' 2"  (1.575 m), weight 78.5 kg, SpO2 95 %.  PHYSICAL EXAMINATION:  Physical Exam   GENERAL:  85 y.o.-year-old elderly patient lying in the bed with no acute distress.  EYES: Pupils equal, round, reactive to light and accommodation. No scleral icterus. Extraocular muscles intact.  HEENT: Head atraumatic, normocephalic. Oropharynx and nasopharynx clear.  NECK:  Supple, no jugular venous distention. No thyroid enlargement, no tenderness.  LUNGS: coarse breath sounds bilaterally, has scattered rhonchi and rales at the bases, no wheezing or crepitation. No use of accessory  muscles of respiration.  CARDIOVASCULAR: S1, S2 normal. No  rubs, or gallops. 3/6 systolic murmur present ABDOMEN: Soft, nontender, nondistended. Bowel sounds present. No organomegaly or mass.  EXTREMITIES: No pedal edema, cyanosis, or clubbing.  NEUROLOGIC: Cranial nerves II through XII are intact. Muscle strength 5/5 in all extremities. Sensation intact. Gait not checked. Global weakness noted. PSYCHIATRIC: The patient is alert and oriented x 3.  SKIN: No obvious rash, lesion, or ulcer.    LABORATORY PANEL:   CBC Recent Labs  Lab 10/23/18 0321  WBC 17.0*  HGB 8.4*  HCT 24.8*  PLT 287   ------------------------------------------------------------------------------------------------------------------  Chemistries  Recent Labs  Lab 11/06/2018 1710  10/23/18 0321  NA 126*   < > 130*  K 4.9  --  4.5  CL 95*  --  96*  CO2 20*  --  22  GLUCOSE 141*  --  136*  BUN 35*  --  36*  CREATININE 1.53*  --  1.59*  CALCIUM 8.8*  --  9.1  AST 52*  --   --   ALT 42  --   --   ALKPHOS 86  --   --   BILITOT 0.7  --   --    < > = values in this interval not displayed.   ------------------------------------------------------------------------------------------------------------------  Cardiac Enzymes Recent Labs  Lab 10/23/18 0912  TROPONINI 1.14*   ------------------------------------------------------------------------------------------------------------------  RADIOLOGY:  Dg Chest 2 View  Result Date: 10/18/2018 CLINICAL DATA:  Initial evaluation for acute shortness of breath, hypoxia. EXAM: CHEST - 2 VIEW COMPARISON:  Prior radiograph from 03/17/2018. FINDINGS: Cardiomegaly, similar to previous. Mediastinal silhouette within normal limits. Aortic atherosclerosis. Lungs are hypoinflated. Moderate layering bilateral pleural effusions. Prominent perihilar vascular  congestion with bilateral airspace opacities, most likely reflecting moderate diffuse pulmonary edema. Superimposed  infection would be difficult to exclude. No pneumothorax. No acute osseous finding. Diffuse spurring noted within the thoracolumbar spine. Prominent degenerative changes noted about the shoulders. IMPRESSION: 1. Cardiomegaly with bilateral pleural effusions and extensive bilateral airspace opacities, likely pulmonary edema. Superimposed infection would be difficult to exclude, and could be considered in the correct clinical setting. 2. Aortic atherosclerosis Electronically Signed   By: Jeannine Boga M.D.   On: 10/20/2018 16:42   US Venous Img Lower Unilateral Left  Result Date: 10/23/2018 CLINICAL DATA:  85 year old female with left lower extremity swelling and shortness of breath EXAM: LEFT LOWER EXTREMITY VENOUS DOPPLER ULTRASOUND TECHNIQUE: Gray-scale sonography with graded compression, as well as color Doppler and duplex ultrasound were performed to evaluate the lower extremity deep venous systems from the level of the common femoral vein and including the common femoral, femoral, profunda femoral, popliteal and calf veins including the posterior tibial, peroneal and gastrocnemius veins when visible. The superficial great saphenous vein was also interrogated. Spectral Doppler was utilized to evaluate flow at rest and with distal augmentation maneuvers in the common femoral, femoral and popliteal veins. COMPARISON:  None. FINDINGS: Contralateral Common Femoral Vein: Respiratory phasicity is normal and symmetric with the symptomatic side. No evidence of thrombus. Normal compressibility. Common Femoral Vein: No evidence of thrombus. Normal compressibility, respiratory phasicity and response to augmentation. Saphenofemoral Junction: No evidence of thrombus. Normal compressibility and flow on color Doppler imaging. Profunda Femoral Vein: No evidence of thrombus. Normal compressibility and flow on color Doppler imaging. Femoral Vein: No evidence of thrombus. Normal compressibility, respiratory phasicity and  response to augmentation. Popliteal Vein: No evidence of thrombus. Normal compressibility, respiratory phasicity and response to augmentation. Calf Veins: No evidence of thrombus. Normal compressibility and flow on color Doppler imaging. Superficial Great Saphenous Vein: No evidence of thrombus. Normal compressibility. Venous Reflux:  None. Other Findings:  None. IMPRESSION: No evidence of deep venous thrombosis. Electronically Signed   By: Jacqulynn Cadet M.D.   On: 10/23/2018 10:07    EKG:   Orders placed or performed during the hospital encounter of 11/01/2018  . ED EKG  . ED EKG    ASSESSMENT AND PLAN:   85 year old female with PMH of CKD stage III, GERD, hypertension, history of stroke who lives with her daughter, came in for worsening shortness of breath.  1.  Acute new onset congestive heart failure-fluid restriction, daily weights -Strict input and output monitoring.  appreciate cardiology consult. -Echocardiogram -On IV Lasix-change to oral  Based on renal function  2.  Community-acquired pneumonia-follow-up blood cultures.  WBC is elevated.  Patient complaining of productive cough. -X-ray with pulmonary edema and may be underlying pneumonia. -We will add Rocephin and azithromycin  3.  NSTEMI-on heparin drip.  Denies any active chest pain now.  Troponin is elevated. -Might need cardiac cath this admission. -Continue aspirin, add beta-blocker and statin  4.  Hyponatremia-secondary to underlying CHF.  Monitor while on Lasix.  Sodium is improved  5.  CKD stage III-IV: Nephrology has been consulted.  Creatinine is stable around 1.5. -No prior outpatient follow-up. -Monitor while on diuretics.  Avoid nephrotoxins  6.  DVT prophylaxis-Heparin drip   Consider physical therapy once cardiac function is stable She ambulates with a walker at baseline.  Daughter updated at bedside    All the records are reviewed and case discussed with Care Management/Social Workerr. Management  plans discussed with the patient, family and  they are in agreement.  CODE STATUS: Partial Code  TOTAL TIME TAKING CARE OF THIS PATIENT: 38 minutes.   POSSIBLE D/C IN 2 DAYS, DEPENDING ON CLINICAL CONDITION.   Gladstone Lighter M.D on 10/23/2018 at 12:36 PM  Between 7am to 6pm - Pager - [phone]  After 6pm go to [email or web address] - password [email or web address] [place] [facility]  Office  [phone]  CC: Primary care physician; Lavera Guise, MD

## 2018-10-23 NOTE — TOC Initial Note (Signed)
Transition of Care [facility]) - Initial/Assessment Note    Patient Details  Name: [name] MRN: [id number] Date of Birth: [birth year]  Transition of Care [facility]) CM/SW Contact:    Elza Rafter, RN Phone Number: 10/23/2018, 12:54 PM  Clinical Narrative:     Patient is from home with daughter and her family.  Current with PCP.  Obtains medications without difficulty at [facility] on [facility]  Uses a walker at home.  Work up for new CHF.  Currently on 6L oxygen acute.  ECHO pending.  Has a functioning scale at home.  Open to [facility] for RN and aide.  Notified Joelene Millin with [facility] of patient hospitalization and will resume [place] orders and add PT at DC.                Expected Discharge Plan: [place]     Patient Goals and CMS Choice Patient states their goals for this hospitalization and ongoing recovery are:: Retrun home with [facility] through [facility] CMS Medicare.gov Compare Post Acute Care list provided to:: Patient Choice offered to / list presented to : Patient, Adult Children  Expected Discharge Plan and Services Expected Discharge Plan: [place] Discharge Planning Services: CM Consult, HF Clinic Post Acute Care Choice: [place] arrangements for the past 2 months: Single Family Home                     HH Arranged: RN, PT, Nurse's Aide [place] Agency: [facility] [place]  Prior Living Arrangements/Services Living arrangements for the past 2 months: Single Family Home Lives with:: Adult Children Patient language and need for interpreter reviewed:: No Do you feel safe going back to the place where you live?: Yes      Need for Family Participation in Patient Care: No (Comment) Care giver support system in place?: Yes (comment) Current home services: Home RN, Homehealth aide Criminal Activity/Legal Involvement Pertinent to Current Situation/Hospitalization: No - Comment as needed  Activities of Daily Living Home Assistive  Devices/Equipment: Bedside commode/3-in-1, Dentures (specify type) ADL Screening (condition at time of admission) Patient's cognitive ability adequate to safely complete daily activities?: Yes Is the patient deaf or have difficulty hearing?: No Does the patient have difficulty seeing, even when wearing glasses/contacts?: No Does the patient have difficulty concentrating, remembering, or making decisions?: No Patient able to express need for assistance with ADLs?: Yes Does the patient have difficulty dressing or bathing?: No Independently performs ADLs?: Yes (appropriate for developmental age) Does the patient have difficulty walking or climbing stairs?: Yes Weakness of Legs: Both Weakness of Arms/Hands: None  Permission Sought/Granted                  Emotional Assessment Appearance:: Appears stated age, Well-Groomed Attitude/Demeanor/Rapport: Self-Confident, Engaged Affect (typically observed): Accepting Orientation: : Oriented to Self, Oriented to Place, Oriented to  Time, Oriented to Situation Alcohol / Substance Use: Not Applicable    Admission diagnosis:  Hyponatremia [E87.1] Acute pulmonary edema (HCC) [J81.0] Hypoxia [R09.02] NSTEMI (non-ST elevated myocardial infarction) [facility]) [I21.4] Patient Active Problem List   Diagnosis Date Noted  . NSTEMI (non-ST elevated myocardial infarction) ([place]) 10/30/2018  . Acute non-recurrent pansinusitis 08/27/2018  . Allergic conjunctivitis of both eyes 07/23/2018  . Primary insomnia 07/23/2018  . Encounter for general adult medical examination with abnormal findings 07/23/2018  . Carotid stenosis, left 03/25/2018  . Loss of consciousness ([place]) 03/17/2018  . Acute pain of left knee 12/05/2017  .  Urinary tract infection with hematuria 12/05/2017  . Dysuria 12/05/2017  . Atopic dermatitis 12/05/2017  . Hypothyroidism, unspecified 08/21/2017  . Peripheral vascular disease, unspecified ([place]) 08/21/2017  . Generalized weakness  08/21/2017  . Type 2 diabetes mellitus without complications ([place]) 71/16/5790  . Restless leg syndrome 08/21/2017  . Cardiac arrhythmia, unspecified 08/21/2017  . Primary generalized (osteo)arthritis 08/21/2017  . Vitamin B12 deficiency anemia, unspecified 08/21/2017  . Other obesity due to excess calories 08/21/2017  . Chronic kidney disease, unspecified 08/21/2017  . Gastro-esophageal reflux disease without esophagitis 08/21/2017  . Hyperkalemia 08/21/2017  . Shortness of breath 08/21/2017  . Wheezing 08/21/2017  . Nausea 08/21/2017  . Allergic rhinitis due to pollen 08/21/2017  . Acute recurrent sinusitis, unspecified 08/21/2017  . Cerebrovascular disease, unspecified 08/21/2017  . Occlusion and stenosis of left carotid artery 08/21/2017  . Gout, unspecified 08/21/2017  . Essential hypertension, benign 01/02/2017  . Hyperlipidemia 06/20/2016  . Carotid stenosis 06/20/2016   PCP:  Lavera Guise, MD Pharmacy:   [facility] [place], [place] - [address] AT [address] [address] Phone: [phone] Fax: [phone]     Social Determinants of Health (SDOH) Interventions    Readmission Risk Interventions 30 Day Unplanned Readmission Risk Score     ED to Hosp-Admission (Current) from 10/13/2018 in [place] (2A)  30 Day Unplanned Readmission Risk Score (%)  16 Filed at 10/23/2018 1200     This score is the patient's risk of an unplanned readmission within 30 days of being discharged (0 -100%). The score is based on dignosis, age, lab data, medications, orders, and past utilization.   Low:  0-14.9   Medium: 15-21.9   High: 22-29.9   Extreme: 30 and above       No flowsheet data found.

## 2018-10-23 NOTE — Progress Notes (Signed)
Cardiovascular and Pulmonary Nurse Navigator Note:   85 year old female with PMH of CKD stage III, GERD, HTN, hx of stroke, who presented to ED with SOB.  Patient found to have acute new onset of CHF (echo pending), CAP, NSTEMI, hyponatremia - secondary to underlying CHF, CKD Stage III-IV.    CHF Education:?? Educational session with patient / two daughters completed. Daughter, [name], cell phone # [phone], informed this RN she is the [facility] and that patient lives with her.   Provided patient / family with "Living Better with Heart Failure" packet. Briefly reviewed definition of heart failure and signs and symptoms of an exacerbation.?Explained to patient/family  that HF is a chronic illness which requires self-assessment / self-management along with help from the cardiologist/PCP.?? ? *Reviewed importance of and reason behind checking weight daily in the AM, after using the bathroom, but before getting dressed. Patient has scale.  ? *Reviewed with patient the following information: *Discussed when to call the Dr= weight gain of >2-3lb overnight of 5lb in a week,  *Discussed yellow zone= call MD: weight gain of >2-3lb overnight of 5lb in a week, increased swelling, increased SOB when lying down, chest discomfort, dizziness, increased fatigue *Red Zone= call 911: struggle to breath, fainting or near fainting, significant chest pain  Heart Failure Magnet given and reviewed with patient and family.    *Diet - Dietitian Consultation ordered.  *Reviewed low sodium diet-provided handout of recommended and not recommended foods. Reviewed sodium restriction of 2000 mg per day and ordered fluid restriction of 1600 ml/day.  Demonstrated this amount using the bedside water pitcher to patient and family ? ? *Instructed patient to take medications as prescribed for heart failure. Explained briefly why pt is on the medications (either make you feel better, live longer or keep you out of the hospital)  and discussed monitoring and side effects.  ? *Activity - Patient ambulates with a walker at home.  Encouraged patient to remain as active as possible.   ? *Smoking Cessation- Patient is a NEVER smoker.? ? Again, the 5 Steps to Living Better with Heart Failure were reviewed with patient.  ? Patient thanked me for providing the above information. ?  Daughter, [name], followed me out of the room and asked this RN if her mother was going to survive this.  This RN recommended she talk with Dr. Tressia Miners about this.  Daughter, [name] stated her Mother has told her she is tired and weary.  This RN asked patient if she had heard of Palliative Care?  Daughter indicated she had not.  This RN explained the program and encouraged her talk with Dr. Tressia Miners about Palliative Care for goals of treatment.  This RN sent Dr. Tressia Miners a secure chat informing Dr. Tressia Miners patient's daughter, [name], cell [phone], would like for her to call her on her cell.   ? Roanna Epley, RN, BSN, [facility]? [facility]  Cardiovascular &?Pulmonary Nurse Navigator  Direct Line: [phone]  Department Phone #: [phone] Fax: [phone]? Email Address: [email or web address]

## 2018-10-23 NOTE — Plan of Care (Signed)
  Problem: Education: Goal: Knowledge of General Education information will improve Description Including pain rating scale, medication(s)/side effects and non-pharmacologic comfort measures Outcome: Progressing   Problem: Health Behavior/Discharge Planning: Goal: Ability to manage health-related needs will improve Outcome: Progressing   Problem: Clinical Measurements: Goal: Will remain free from infection Outcome: Progressing Note:  Remains afebrile Goal: Cardiovascular complication will be avoided Outcome: Progressing   Problem: Cardiac: Goal: Ability to achieve and maintain adequate cardiopulmonary perfusion will improve Outcome: Progressing   Problem: Clinical Measurements: Goal: Diagnostic test results will improve Outcome: Not Progressing Note:  Troponin trending up last was 1.14, BNP on admission was 804, WBC 14, BUN 36/1.59 Goal: Respiratory complications will improve Outcome: Not Progressing Note:  Remains on 6LO2 per , this acute

## 2018-10-23 NOTE — Consult Note (Signed)
[place] for heparin Indication: chest pain/ACS  Allergies  Allergen Reactions  . Sulfa Antibiotics     Patient Measurements: Height: 5\' 2"  (157.5 cm) Weight: 173 lb (78.5 kg) IBW/kg (Calculated) : 50.1 Heparin Dosing Weight: 70.22 kg  Vital Signs: Temp: 98.1 F (36.7 C) (03/11 0817) Temp Source: Oral (03/11 0817) BP: 127/68 (03/11 0817) Pulse Rate: 86 (03/11 0817)  Labs: Recent Labs    10/27/2018 1605  10/21/2018 1710 10/24/2018 2131 10/23/18 0321 10/23/18 0912 10/23/18 1119  HGB 9.1*  --   --   --  8.4*  --   --   HCT 26.7*  --   --   --  24.8*  --   --   PLT 329  --   --   --  287  --   --   APTT  --   --   --   --  81*  --   --   LABPROT  --   --   --   --  14.3  --   --   INR  --   --   --   --  1.1  --   --   HEPARINUNFRC  --   --   --   --  0.33  --  0.23*  CREATININE  --   --  1.53*  --  1.59*  --   --   TROPONINI  --    < > 0.73* 0.78* 0.93* 1.14*  --    < > = values in this interval not displayed.    Estimated Creatinine Clearance: 23.3 mL/min (A) (by C-G formula based on SCr of 1.59 mg/dL (H)).   Medical History: Past Medical History:  Diagnosis Date  . Allergy   . Arthritis   . Carotid arterial disease ([place])    a. 2008 R CEA; b. 03/2018 s/p L CEA; c. 08/2018 Carotid U/S: RICA 1-15%, LICA 72-62%. Bilat ECA < 50%.  . CKD (chronic kidney disease), stage III ([place])   . Diastolic dysfunction    a. 03/2018 Echo: EF 55-60%, Gr1 DD. mIld AI/AS. Mild MR. MIldly dil LA.  Marland Kitchen GERD (gastroesophageal reflux disease)   . Hyperlipidemia   . Hypertension   . Insomnia   . Stroke [facility])     Medications:  Medications Prior to Admission  Medication Sig Dispense Refill Last Dose  . allopurinol (ZYLOPRIM) 100 MG tablet take 2 tablets by mouth at bedtime for gout 60 tablet 6 10/21/2018 at 1800  . ALPRAZolam (XANAX) 0.25 MG tablet Take 1 tablet (0.25 mg total) by mouth at bedtime as needed for anxiety. 30 tablet 3 10/21/2018 at 2100  .  amLODipine (NORVASC) 2.5 MG tablet take 1 tablet by mouth at lunch 30 tablet 6 11/01/2018 at 1200  . aspirin EC 81 MG EC tablet Take 1 tablet (81 mg total) by mouth daily. 30 tablet 5 10/21/2018 at 1800  . benazepril (LOTENSIN) 5 MG tablet Take 1 tablet (5 mg total) by mouth 2 (two) times daily. 60 tablet 6 10/20/2018 at 0630  . Cholecalciferol (VITAMIN D3) 2000 units capsule Take 2,000 Units by mouth daily.    10/27/2018 at 0630  . cloNIDine (CATAPRES - DOSED IN MG/24 HR) 0.2 mg/24hr patch apply 1 patch every week 4 patch 5 Past Week at Unknown time  . clopidogrel (PLAVIX) 75 MG tablet Take 1 tablet (75 mg total) by mouth daily with breakfast. 30 tablet 3 11/10/2018 at 0630  .  conjugated estrogens (PREMARIN) vaginal cream Place 1 Applicatorful vaginally once a week. At night.   prn at prn  . cyanocobalamin (,VITAMIN B-12,) 1000 MCG/ML injection INJECT 1 MILLILITER INTRAMUSCULARLY ONCE A MONTH 1 mL 3 Past Month at Unknown time  . diclofenac sodium (VOLTAREN) 1 % GEL Apply 2 g topically 2 (two) times daily.   prn at prn  . estrogens, conjugated, (PREMARIN) 0.625 MG tablet Take 0.625 mg by mouth daily. Take daily for 21 days then do not take for 7 days.   prn at prn  . FEROSUL 325 (65 Fe) MG tablet take 1 tablet by mouth twice a day (Patient taking differently: Take 325 mg by mouth daily. ) 60 tablet 5 11/03/2018 at 0630  . Multiple Vitamins-Minerals (CENTRUM SILVER 50+WOMEN) TABS Take 1 tablet by mouth daily.   11/12/2018 at 0630  . omeprazole (PRILOSEC) 20 MG capsule take 1 capsule by mouth once daily FOR BELCHING AND STOMACH. 30 capsule 5 10/29/2018 at 0630  . pravastatin (PRAVACHOL) 20 MG tablet Take 1 tablet (20 mg total) by mouth daily. 30 tablet 6 10/21/2018 at 1800  . triamcinolone (KENALOG) 0.025 % cream Apply 1 application topically 2 (two) times daily. 80 g 2 prn at prn  . Olopatadine HCl 0.2 % SOLN Apply 1 drop to eye daily. 2.5 mL 5 prn at prn   Scheduled:  . allopurinol  200 mg Oral QHS  .  aspirin EC  81 mg Oral Daily  . atorvastatin  40 mg Oral q1800  . carvedilol  3.125 mg Oral BID WC  . cholecalciferol  2,000 Units Oral Daily  . clopidogrel  75 mg Oral Q breakfast  . furosemide  60 mg Intravenous BID  . mouth rinse  15 mL Mouth Rinse BID  . multivitamin with minerals  1 tablet Oral Daily  . nitroGLYCERIN  0.5 inch Topical Q6H  . olopatadine  1 drop Both Eyes BID  . pantoprazole  40 mg Oral Daily  . sodium chloride flush  3 mL Intravenous Q12H   Infusions:  . sodium chloride    . heparin 800 Units/hr (10/23/18 1106)   PRN:   Assessment: Pharmacy was consulted to start heparin for ACS. No DOAC PTA noted. Likely a NSTEMI picture.    03/11 @ 0320 HL 0.33 03/11 @ 1119 HL 0.23  Goal of Therapy:  Heparin level 0.3-0.7 units/ml Monitor platelets by anticoagulation protocol: Yes   Plan:  Current heparin level for patient is subtherapeutic. Will give IV Bolus dose of 1000 units and change rate to 900 units/hr and will recheck HL @ 2100, CBC trending down will continue to monitor.  Rito Ehrlich, PharmD Clinical Pharmacist 10/23/2018

## 2018-10-23 NOTE — Consult Note (Signed)
[place] for heparin Indication: chest pain/ACS  Allergies  Allergen Reactions  . Sulfa Antibiotics     Patient Measurements: Height: 5\' 2"  (157.5 cm) Weight: 173 lb (78.5 kg) IBW/kg (Calculated) : 50.1 Heparin Dosing Weight: 70.22 kg  Vital Signs: Temp: 98.1 F (36.7 C) (03/11 0324) Temp Source: Oral (03/11 0324) BP: 112/48 (03/11 0324) Pulse Rate: 75 (03/11 0324)  Labs: Recent Labs    11/03/2018 1605 11/11/2018 1710 10/19/2018 2131 10/23/18 0321  HGB 9.1*  --   --  8.4*  HCT 26.7*  --   --  24.8*  PLT 329  --   --  287  APTT  --   --   --  81*  LABPROT  --   --   --  14.3  INR  --   --   --  1.1  HEPARINUNFRC  --   --   --  0.33  CREATININE  --  1.53*  --  1.59*  TROPONINI  --  0.73* 0.78* 0.93*    Estimated Creatinine Clearance: 23.3 mL/min (A) (by C-G formula based on SCr of 1.59 mg/dL (H)).   Medical History: Past Medical History:  Diagnosis Date  . Allergy   . Arthritis   . CKD (chronic kidney disease), stage III ([place])   . GERD (gastroesophageal reflux disease)   . Hyperlipidemia   . Hypertension   . Insomnia   . Stroke [facility])     Medications:  Medications Prior to Admission  Medication Sig Dispense Refill Last Dose  . allopurinol (ZYLOPRIM) 100 MG tablet take 2 tablets by mouth at bedtime for gout 60 tablet 6 10/21/2018 at 1800  . ALPRAZolam (XANAX) 0.25 MG tablet Take 1 tablet (0.25 mg total) by mouth at bedtime as needed for anxiety. 30 tablet 3 10/21/2018 at 2100  . amLODipine (NORVASC) 2.5 MG tablet take 1 tablet by mouth at lunch 30 tablet 6 11/11/2018 at 1200  . aspirin EC 81 MG EC tablet Take 1 tablet (81 mg total) by mouth daily. 30 tablet 5 10/21/2018 at 1800  . benazepril (LOTENSIN) 5 MG tablet Take 1 tablet (5 mg total) by mouth 2 (two) times daily. 60 tablet 6 11/06/2018 at 0630  . Cholecalciferol (VITAMIN D3) 2000 units capsule Take 2,000 Units by mouth daily.    11/06/2018 at 0630  . cloNIDine (CATAPRES -  DOSED IN MG/24 HR) 0.2 mg/24hr patch apply 1 patch every week 4 patch 5 Past Week at Unknown time  . clopidogrel (PLAVIX) 75 MG tablet Take 1 tablet (75 mg total) by mouth daily with breakfast. 30 tablet 3 11/09/2018 at 0630  . conjugated estrogens (PREMARIN) vaginal cream Place 1 Applicatorful vaginally once a week. At night.   prn at prn  . cyanocobalamin (,VITAMIN B-12,) 1000 MCG/ML injection INJECT 1 MILLILITER INTRAMUSCULARLY ONCE A MONTH 1 mL 3 Past Month at Unknown time  . diclofenac sodium (VOLTAREN) 1 % GEL Apply 2 g topically 2 (two) times daily.   prn at prn  . estrogens, conjugated, (PREMARIN) 0.625 MG tablet Take 0.625 mg by mouth daily. Take daily for 21 days then do not take for 7 days.   prn at prn  . FEROSUL 325 (65 Fe) MG tablet take 1 tablet by mouth twice a day (Patient taking differently: Take 325 mg by mouth daily. ) 60 tablet 5 11/12/2018 at 0630  . Multiple Vitamins-Minerals (CENTRUM SILVER 50+WOMEN) TABS Take 1 tablet by mouth daily.   11/04/2018  at 0630  . omeprazole (PRILOSEC) 20 MG capsule take 1 capsule by mouth once daily FOR BELCHING AND STOMACH. 30 capsule 5 11/04/2018 at 0630  . pravastatin (PRAVACHOL) 20 MG tablet Take 1 tablet (20 mg total) by mouth daily. 30 tablet 6 10/21/2018 at 1800  . triamcinolone (KENALOG) 0.025 % cream Apply 1 application topically 2 (two) times daily. 80 g 2 prn at prn  . Olopatadine HCl 0.2 % SOLN Apply 1 drop to eye daily. 2.5 mL 5 prn at prn   Scheduled:  . allopurinol  200 mg Oral QHS  . aspirin  324 mg Oral NOW   Or  . aspirin  300 mg Rectal NOW  . aspirin EC  81 mg Oral Daily  . aspirin EC  81 mg Oral Daily  . benazepril  5 mg Oral BID  . carvedilol  3.125 mg Oral BID WC  . cholecalciferol  2,000 Units Oral Daily  . clopidogrel  75 mg Oral Q breakfast  . furosemide  60 mg Intravenous BID  . mouth rinse  15 mL Mouth Rinse BID  . multivitamin with minerals  1 tablet Oral Daily  . olopatadine  1 drop Both Eyes BID  . pantoprazole   40 mg Oral Daily  . pravastatin  20 mg Oral Daily  . sodium chloride flush  3 mL Intravenous Q12H   Infusions:  . sodium chloride    . heparin 800 Units/hr (10/23/2018 1842)   PRN:   Assessment: Pharmacy was consulted to start heparin for ACS. No DOAC PTA noted. Likely a NSTEMI picture.    Goal of Therapy:  Heparin level 0.3-0.7 units/ml Monitor platelets by anticoagulation protocol: Yes   Plan:  03/10 @ 0320 HL 0.33 therapeutic. Will continue current rate of 800 units/hr and will recheck HL @ 1100, CBC trending down will continue to monitor.  Tobie Lords, PharmD, BCPS Clinical Pharmacist 10/23/2018

## 2018-10-23 NOTE — Progress Notes (Signed)
*  PRELIMINARY RESULTS* Echocardiogram 2D Echocardiogram has been performed.  Sherrie Sport 10/23/2018, 1:54 PM
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
## 2018-10-23 NOTE — Plan of Care (Signed)

## 2018-10-23 NOTE — Progress Notes (Signed)
To cardiovascular testing for echo

## 2018-10-23 NOTE — Consult Note (Signed)
Date: 10/23/2018                  Patient Name:  [name]  MRN: [id number]  DOB: [birth year]  Age / Sex: 85 y.o., female         PCP: Lavera Guise, MD                 Service Requesting Consult: IM/ Gladstone Lighter, MD                 Reason for Consult: Hyponatremia            History of Present Illness: Patient is a 85 y.o. female with medical problems of chronic kidney disease, hyperlipidemia, hypertension, history of stroke, osteoarthritis, who was admitted to [facility] on 11/01/2018 for evaluation of shortness of breath and worsening lower extremity edema.  Troponins were mildly elevated.  Treated with IV diuretics and admitted for further evaluation.  Baseline creatinine appears to be 1.3/GFR 34/40 from August 2019 Presenting creatinine 1.5 today.  Sodium was low at 126 which is slowly improving to 130 this morning Troponin trends are increasing Previous urinalysis from August 27, 2018 shows 2+ protein. Chest x-ray shows cardiomegaly with bilateral pleural effusions and extensive bilateral air opacities likely pulmonary edema.  Possibility of superimposed infection Previous echo from August 2019 shows LVEF of 55 to 60% and grade 1 diastolic dysfunction  Medications: Outpatient medications: Medications Prior to Admission  Medication Sig Dispense Refill Last Dose  . allopurinol (ZYLOPRIM) 100 MG tablet take 2 tablets by mouth at bedtime for gout 60 tablet 6 10/21/2018 at 1800  . ALPRAZolam (XANAX) 0.25 MG tablet Take 1 tablet (0.25 mg total) by mouth at bedtime as needed for anxiety. 30 tablet 3 10/21/2018 at 2100  . amLODipine (NORVASC) 2.5 MG tablet take 1 tablet by mouth at lunch 30 tablet 6 10/27/2018 at 1200  . aspirin EC 81 MG EC tablet Take 1 tablet (81 mg total) by mouth daily. 30 tablet 5 10/21/2018 at 1800  . benazepril (LOTENSIN) 5 MG tablet Take 1 tablet (5 mg total) by mouth 2 (two) times daily. 60 tablet 6 10/27/2018 at 0630  . Cholecalciferol (VITAMIN D3) 2000 units  capsule Take 2,000 Units by mouth daily.    11/11/2018 at 0630  . cloNIDine (CATAPRES - DOSED IN MG/24 HR) 0.2 mg/24hr patch apply 1 patch every week 4 patch 5 Past Week at Unknown time  . clopidogrel (PLAVIX) 75 MG tablet Take 1 tablet (75 mg total) by mouth daily with breakfast. 30 tablet 3 11/02/2018 at 0630  . conjugated estrogens (PREMARIN) vaginal cream Place 1 Applicatorful vaginally once a week. At night.   prn at prn  . cyanocobalamin (,VITAMIN B-12,) 1000 MCG/ML injection INJECT 1 MILLILITER INTRAMUSCULARLY ONCE A MONTH 1 mL 3 Past Month at Unknown time  . diclofenac sodium (VOLTAREN) 1 % GEL Apply 2 g topically 2 (two) times daily.   prn at prn  . estrogens, conjugated, (PREMARIN) 0.625 MG tablet Take 0.625 mg by mouth daily. Take daily for 21 days then do not take for 7 days.   prn at prn  . FEROSUL 325 (65 Fe) MG tablet take 1 tablet by mouth twice a day (Patient taking differently: Take 325 mg by mouth daily. ) 60 tablet 5 10/14/2018 at 0630  . Multiple Vitamins-Minerals (CENTRUM SILVER 50+WOMEN) TABS Take 1 tablet by mouth daily.   10/31/2018 at 0630  . omeprazole (PRILOSEC) 20 MG capsule take 1 capsule  by mouth once daily FOR BELCHING AND STOMACH. 30 capsule 5 10/25/2018 at 0630  . pravastatin (PRAVACHOL) 20 MG tablet Take 1 tablet (20 mg total) by mouth daily. 30 tablet 6 10/21/2018 at 1800  . triamcinolone (KENALOG) 0.025 % cream Apply 1 application topically 2 (two) times daily. 80 g 2 prn at prn  . Olopatadine HCl 0.2 % SOLN Apply 1 drop to eye daily. 2.5 mL 5 prn at prn    Current medications: Current Facility-Administered Medications  Medication Dose Route Frequency Provider Last Rate Last Dose  . 0.9 %  sodium chloride infusion  250 mL Intravenous PRN Pyreddy, Reatha Harps, MD      . acetaminophen (TYLENOL) tablet 650 mg  650 mg Oral Q4H PRN Pyreddy, Reatha Harps, MD      . allopurinol (ZYLOPRIM) tablet 200 mg  200 mg Oral Corwin Levins, MD   200 mg at 10/23/2018 2350  . ALPRAZolam Duanne Moron)  tablet 0.25 mg  0.25 mg Oral QHS PRN Saundra Shelling, MD   0.25 mg at 10/16/2018 2335  . aspirin EC tablet 81 mg  81 mg Oral Daily Pyreddy, Pavan, MD      . atorvastatin (LIPITOR) tablet 40 mg  40 mg Oral q1800 Theora Gianotti, NP      . carvedilol (COREG) tablet 3.125 mg  3.125 mg Oral BID WC Pyreddy, Reatha Harps, MD   3.125 mg at 10/23/18 0826  . cholecalciferol (VITAMIN D) tablet 2,000 Units  2,000 Units Oral Daily Pyreddy, Pavan, MD      . clopidogrel (PLAVIX) tablet 75 mg  75 mg Oral Q breakfast Pyreddy, Pavan, MD   75 mg at 10/23/18 0826  . furosemide (LASIX) injection 60 mg  60 mg Intravenous BID Saundra Shelling, MD   60 mg at 10/23/18 0826  . heparin ADULT infusion 100 units/mL (25000 units/262mL sodium chloride 0.45%)  800 Units/hr Intravenous Continuous Harvest Dark, MD 8 mL/hr at 10/23/18 0700 800 Units/hr at 10/23/18 0700  . MEDLINE mouth rinse  15 mL Mouth Rinse BID Pyreddy, Reatha Harps, MD      . multivitamin with minerals tablet 1 tablet  1 tablet Oral Daily Pyreddy, Pavan, MD      . nitroGLYCERIN (NITROGLYN) 2 % ointment 0.5 inch  0.5 inch Topical Q6H Theora Gianotti, NP      . nitroGLYCERIN (NITROSTAT) SL tablet 0.4 mg  0.4 mg Sublingual Q5 Min x 3 PRN Saundra Shelling, MD   0.4 mg at 10/23/18 0411  . olopatadine (PATANOL) 0.1 % ophthalmic solution 1 drop  1 drop Both Eyes BID Pyreddy, Pavan, MD      . ondansetron (ZOFRAN) injection 4 mg  4 mg Intravenous Q6H PRN Pyreddy, Pavan, MD      . pantoprazole (PROTONIX) EC tablet 40 mg  40 mg Oral Daily Pyreddy, Pavan, MD      . sodium chloride flush (NS) 0.9 % injection 3 mL  3 mL Intravenous Q12H Pyreddy, Pavan, MD      . sodium chloride flush (NS) 0.9 % injection 3 mL  3 mL Intravenous PRN Saundra Shelling, MD          Allergies: Allergies  Allergen Reactions  . Sulfa Antibiotics       Past Medical History: Past Medical History:  Diagnosis Date  . Allergy   . Arthritis   . Carotid arterial disease ([place])    a. 2008 R  CEA; b. 03/2018 s/p L CEA; c. 08/2018 Carotid U/S: RICA 4-40%, LICA 10-27%. Bilat ECA <  50%.  . CKD (chronic kidney disease), stage III ([place])   . Diastolic dysfunction    a. 03/2018 Echo: EF 55-60%, Gr1 DD. mIld AI/AS. Mild MR. MIldly dil LA.  Marland Kitchen GERD (gastroesophageal reflux disease)   . Hyperlipidemia   . Hypertension   . Insomnia   . Stroke [facility])      Past Surgical History: Past Surgical History:  Procedure Laterality Date  . APPENDECTOMY    . BACK SURGERY    . CAROTID ENDARTERECTOMY Right   . CHOLECYSTECTOMY    . ENDARTERECTOMY Left 03/25/2018   Procedure: ENDARTERECTOMY CAROTID;  Surgeon: Algernon Huxley, MD;  Location: [facility] ORS;  Service: Vascular;  Laterality: Left;  . EYE SURGERY Bilateral   . FOOT SURGERY Left   . HERNIA REPAIR    . HIP SURGERY Left   . TONSILLECTOMY       Family History: Family History  Problem Relation Age of Onset  . Congestive Heart Failure Mother   . Heart disease Mother   . Heart disease Father   . Diabetes Brother      Social History: Social History   Socioeconomic History  . Marital status: Widowed    Spouse name: Not on file  . Number of children: Not on file  . Years of education: Not on file  . Highest education level: Not on file  Occupational History  . Not on file  Social Needs  . Financial resource strain: Not on file  . Food insecurity:    Worry: Not on file    Inability: Not on file  . Transportation needs:    Medical: Not on file    Non-medical: Not on file  Tobacco Use  . Smoking status: Never Smoker  . Smokeless tobacco: Never Used  Substance and Sexual Activity  . Alcohol use: No  . Drug use: No  . Sexual activity: Not on file  Lifestyle  . Physical activity:    Days per week: Not on file    Minutes per session: Not on file  . Stress: Not on file  Relationships  . Social connections:    Talks on phone: Not on file    Gets together: Not on file    Attends religious service: Not on file    Active member of  club or organization: Not on file    Attends meetings of clubs or organizations: Not on file    Relationship status: Not on file  . Intimate partner violence:    Fear of current or ex partner: Not on file    Emotionally abused: Not on file    Physically abused: Not on file    Forced sexual activity: Not on file  Other Topics Concern  . Not on file  Social History Narrative  . Not on file     Review of Systems: Gen: Subjective fevers at home HEENT: Denies any vision or hearing issues, had some blood-tinged sinus discharge  CV: Chest pain, non-STEMI Resp: Sputum production, no hemoptysis GI: Appetite has been fair.  Some nausea but no vomiting or diarrhea GU : Denies any problems with voiding MS: At baseline, uses walker to ambulate at home. Derm:  No complaints Psych: No complaints Heme: No complaints Neuro: No complaints Endocrine.  No complaints  Vital Signs: Blood pressure 127/68, pulse 86, temperature 98.1 F (36.7 C), temperature source Oral, resp. rate 20, height 5\' 2"  (1.575 m), weight 78.5 kg, SpO2 95 %.   Intake/Output Summary (Last 24 hours) at 10/23/2018  [place] filed at 10/23/2018 0700 Gross per 24 hour  Intake 131.62 ml  Output 400 ml  Net -268.38 ml    Weight trends: Filed Weights   10/24/2018 1601 11/07/2018 2115 10/23/18 0324  Weight: 74.8 kg 78.5 kg 78.5 kg    Physical Exam: General:  Frail, elderly woman, laying in the bed  HEENT  anicteric, moist oral mucous membranes  Neck:  Supple, no masses  Lungs:  Normal breathing effort, mild basilar crackles  Heart::  No rub  Abdomen:  Soft, nontender  Extremities:  No peripheral edema, SCDs in place  Neurologic:  Alert, able to answer questions appropriately  Skin:  No acute rashes    Lab results: Basic Metabolic Panel: Recent Labs  Lab 10/24/2018 1710 10/17/2018 2131 10/23/18 0321  NA 126* 128* 130*  K 4.9  --  4.5  CL 95*  --  96*  CO2 20*  --  22  GLUCOSE 141*  --  136*  BUN 35*  --   36*  CREATININE 1.53*  --  1.59*  CALCIUM 8.8*  --  9.1    Liver Function Tests: Recent Labs  Lab 11/01/2018 1710  AST 52*  ALT 42  ALKPHOS 86  BILITOT 0.7  PROT 6.9  ALBUMIN 3.3*   No results for input(s): LIPASE, AMYLASE in the last 168 hours. No results for input(s): AMMONIA in the last 168 hours.  CBC: Recent Labs  Lab 10/23/2018 1605 10/23/18 0321  WBC 13.0* 17.0*  HGB 9.1* 8.4*  HCT 26.7* 24.8*  MCV 96.0 95.4  PLT 329 287    Cardiac Enzymes: Recent Labs  Lab 10/23/18 0912  TROPONINI 1.14*    BNP: Invalid input(s): POCBNP  CBG: No results for input(s): GLUCAP in the last 168 hours.  Microbiology: No results found for this or any previous visit (from the past 720 hour(s)).   Coagulation Studies: Recent Labs    10/23/18 0321  LABPROT 14.3  INR 1.1    Urinalysis: No results for input(s): COLORURINE, LABSPEC, PHURINE, GLUCOSEU, HGBUR, BILIRUBINUR, KETONESUR, PROTEINUR, UROBILINOGEN, NITRITE, LEUKOCYTESUR in the last 72 hours.  Invalid input(s): APPERANCEUR      Imaging: Dg Chest 2 View  Result Date: 10/19/2018 CLINICAL DATA:  Initial evaluation for acute shortness of breath, hypoxia. EXAM: CHEST - 2 VIEW COMPARISON:  Prior radiograph from 03/17/2018. FINDINGS: Cardiomegaly, similar to previous. Mediastinal silhouette within normal limits. Aortic atherosclerosis. Lungs are hypoinflated. Moderate layering bilateral pleural effusions. Prominent perihilar vascular congestion with bilateral airspace opacities, most likely reflecting moderate diffuse pulmonary edema. Superimposed infection would be difficult to exclude. No pneumothorax. No acute osseous finding. Diffuse spurring noted within the thoracolumbar spine. Prominent degenerative changes noted about the shoulders. IMPRESSION: 1. Cardiomegaly with bilateral pleural effusions and extensive bilateral airspace opacities, likely pulmonary edema. Superimposed infection would be difficult to exclude, and  could be considered in the correct clinical setting. 2. Aortic atherosclerosis Electronically Signed   By: Jeannine Boga M.D.   On: 10/25/2018 16:42   US Venous Img Lower Unilateral Left  Result Date: 10/23/2018 CLINICAL DATA:  85 year old female with left lower extremity swelling and shortness of breath EXAM: LEFT LOWER EXTREMITY VENOUS DOPPLER ULTRASOUND TECHNIQUE: Gray-scale sonography with graded compression, as well as color Doppler and duplex ultrasound were performed to evaluate the lower extremity deep venous systems from the level of the common femoral vein and including the common femoral, femoral, profunda femoral, popliteal and calf veins including the posterior tibial, peroneal and gastrocnemius veins when  visible. The superficial great saphenous vein was also interrogated. Spectral Doppler was utilized to evaluate flow at rest and with distal augmentation maneuvers in the common femoral, femoral and popliteal veins. COMPARISON:  None. FINDINGS: Contralateral Common Femoral Vein: Respiratory phasicity is normal and symmetric with the symptomatic side. No evidence of thrombus. Normal compressibility. Common Femoral Vein: No evidence of thrombus. Normal compressibility, respiratory phasicity and response to augmentation. Saphenofemoral Junction: No evidence of thrombus. Normal compressibility and flow on color Doppler imaging. Profunda Femoral Vein: No evidence of thrombus. Normal compressibility and flow on color Doppler imaging. Femoral Vein: No evidence of thrombus. Normal compressibility, respiratory phasicity and response to augmentation. Popliteal Vein: No evidence of thrombus. Normal compressibility, respiratory phasicity and response to augmentation. Calf Veins: No evidence of thrombus. Normal compressibility and flow on color Doppler imaging. Superficial Great Saphenous Vein: No evidence of thrombus. Normal compressibility. Venous Reflux:  None. Other Findings:  None. IMPRESSION: No  evidence of deep venous thrombosis. Electronically Signed   By: Jacqulynn Cadet M.D.   On: 10/23/2018 10:07      Assessment & Plan: Pt is a 85 y.o.   female with hypertension, chronic kidney disease, history of stroke, carotid endarterectomy, was admitted on 11/10/2018 with shortness of breath and non-STEMI.   1.  Hyponatremia 2.  Acute kidney injury 3.  Chronic kidney disease stage III 4.  Proteinuria 5.  Hypertension with CKD 6.  Acute pulmonary edema 7.  Non-STEMI  Patient appears to have chronic kidney disease with baseline creatinine of 1.53/GFR 38 at the time of presentation.  Sodium was low at 126.  With IV diuresis, sodium has improved to 130.  Serum creatinine is higher at 1.59 which may be related to aggressive diuresis  I would discontinue further IV furosemide as volume status appears to have improved.  May consider adding oral torsemide starting tomorrow. Blood pressure is controlled at present Will await further plans from cardiologist regarding non-STEMI We will follow   LOS: [place] 3/11/202010:16 [place], [place]  Note: This note was prepared with Dragon dictation. Any transcription errors are unintentional

## 2018-10-24 DIAGNOSIS — Z515 Encounter for palliative care: Secondary | ICD-10-CM

## 2018-10-24 DIAGNOSIS — R0603 Acute respiratory distress: Secondary | ICD-10-CM

## 2018-10-24 DIAGNOSIS — I248 Other forms of acute ischemic heart disease: Secondary | ICD-10-CM

## 2018-10-24 DIAGNOSIS — Z7189 Other specified counseling: Secondary | ICD-10-CM

## 2018-10-24 DIAGNOSIS — D509 Iron deficiency anemia, unspecified: Secondary | ICD-10-CM

## 2018-10-24 DIAGNOSIS — D72829 Elevated white blood cell count, unspecified: Secondary | ICD-10-CM

## 2018-10-24 DIAGNOSIS — N179 Acute kidney failure, unspecified: Secondary | ICD-10-CM

## 2018-10-24 DIAGNOSIS — N183 Chronic kidney disease, stage 3 (moderate): Secondary | ICD-10-CM

## 2018-10-24 LAB — BASIC METABOLIC PANEL
Anion gap: 10 (ref 5–15)
BUN: 48 mg/dL — ABNORMAL HIGH (ref 8–23)
CHLORIDE: 94 mmol/L — AB (ref 98–111)
CO2: 24 mmol/L (ref 22–32)
Calcium: 8.4 mg/dL — ABNORMAL LOW (ref 8.9–10.3)
Creatinine, Ser: 2.02 mg/dL — ABNORMAL HIGH (ref 0.44–1.00)
GFR calc Af Amer: 25 mL/min — ABNORMAL LOW (ref >60)
GFR calc non Af Amer: 21 mL/min — ABNORMAL LOW (ref >60)
Glucose, Bld: 116 mg/dL — ABNORMAL HIGH (ref 70–99)
Potassium: 4.3 mmol/L (ref 3.5–5.1)
Sodium: 128 mmol/L — ABNORMAL LOW (ref 135–145)

## 2018-10-24 LAB — BLOOD CULTURE ID PANEL (REFLEXED)

## 2018-10-24 LAB — CBC
HEMATOCRIT: 22.4 % — AB (ref 36.0–46.0)
Hemoglobin: 7.4 g/dL — ABNORMAL LOW (ref 12.0–15.0)
MCH: 31.8 pg (ref 26.0–34.0)
MCHC: 33 g/dL (ref 30.0–36.0)
MCV: 96.1 fL (ref 80.0–100.0)
Platelets: 274 10*3/uL (ref 150–400)
RBC: 2.33 MIL/uL — ABNORMAL LOW (ref 3.87–5.11)
RDW: 13.4 % (ref 11.5–15.5)
WBC: 14.7 10*3/uL — AB (ref 4.0–10.5)
nRBC: 0 % (ref 0.0–0.2)

## 2018-10-24 LAB — PROTIME-INR
INR: 1.2 (ref 0.8–1.2)
INR: 0.9 (ref 0.8–1.2)
Prothrombin Time: 14.7 seconds (ref 11.4–15.2)
Prothrombin Time: 12.4 seconds (ref 11.4–15.2)

## 2018-10-24 LAB — APTT
APTT: 151 s — AB (ref 24–36)
aPTT: 41 seconds — ABNORMAL HIGH (ref 24–36)

## 2018-10-24 LAB — TROPONIN I: Troponin I: 0.66 ng/mL (ref <0.03)

## 2018-10-24 LAB — HEPARIN LEVEL (UNFRACTIONATED): Heparin Unfractionated: 0.38 IU/mL (ref 0.30–0.70)

## 2018-10-24 MED ORDER — SODIUM CHLORIDE 0.9 % IV SOLN
2.0000 g | Freq: Every day | INTRAVENOUS | Status: DC
  Administered 2018-10-25 – 2018-10-26 (×2): 2 g via INTRAVENOUS
  Filled 2018-10-24 (×2): qty 2

## 2018-10-24 MED ORDER — SODIUM CHLORIDE 0.9 % IV SOLN
1.0000 g | INTRAVENOUS | Status: DC
  Filled 2018-10-24: qty 10

## 2018-10-24 MED ORDER — HEPARIN SODIUM (PORCINE) 5000 UNIT/ML IJ SOLN
5000.0000 [IU] | Freq: Three times a day (TID) | INTRAMUSCULAR | Status: DC
  Administered 2018-10-24 – 2018-10-26 (×8): 5000 [IU] via SUBCUTANEOUS
  Filled 2018-10-24 (×8): qty 1

## 2018-10-24 NOTE — Progress Notes (Addendum)
[facility] [place] NAME: [name]    MR#:  [id number]  DATE OF BIRTH:  [birth year]  SUBJECTIVE:  CHIEF COMPLAINT:   Chief Complaint  Patient presents with   Shortness of Breath   -Still complains of significant shortness of breath and productive cough. -Hemoglobin low this morning.  Denies any chest pain.  On heparin drip  REVIEW OF SYSTEMS:  Review of Systems  Constitutional: Positive for chills and malaise/fatigue. Negative for fever.  HENT: Negative for congestion, ear discharge, hearing loss and nosebleeds.   Eyes: Negative for blurred vision and double vision.  Respiratory: Positive for cough and shortness of breath. Negative for wheezing.   Cardiovascular: Positive for orthopnea and leg swelling. Negative for chest pain and palpitations.  Gastrointestinal: Negative for abdominal pain, constipation, diarrhea, nausea and vomiting.  Genitourinary: Negative for dysuria.  Musculoskeletal: Negative for myalgias.  Neurological: Negative for dizziness, focal weakness, seizures, weakness and headaches.  Psychiatric/Behavioral: Negative for depression.    DRUG ALLERGIES:   Allergies  Allergen Reactions   Sulfa Antibiotics     VITALS:  Blood pressure (!) 102/47, pulse 71, temperature (!) 97.2 F (36.2 C), resp. rate 18, height 5\' 2"  (1.575 m), weight 76.7 kg, SpO2 97 %.  PHYSICAL EXAMINATION:  Physical Exam   GENERAL:  85 y.o.-year-old elderly patient lying in the bed with no acute distress.  EYES: Pupils equal, round, reactive to light and accommodation. No scleral icterus. Extraocular muscles intact.  HEENT: Head atraumatic, normocephalic. Oropharynx and nasopharynx clear.  NECK:  Supple, no jugular venous distention. No thyroid enlargement, no tenderness.  LUNGS: coarse breath sounds bilaterally, has scattered rhonchi and rales at the bases, no wheezing or crepitation. No use of accessory muscles of respiration.    CARDIOVASCULAR: S1, S2 normal. No  rubs, or gallops. 3/6 systolic murmur present ABDOMEN: Soft, nontender, nondistended. Bowel sounds present. No organomegaly or mass.  EXTREMITIES: No pedal edema, cyanosis, or clubbing.  NEUROLOGIC: Cranial nerves II through XII are intact. Muscle strength 5/5 in all extremities. Sensation intact. Gait not checked. Global weakness noted. PSYCHIATRIC: The patient is alert and oriented x 3.  SKIN: No obvious rash, lesion, or ulcer.    LABORATORY PANEL:   CBC Recent Labs  Lab 10/24/18 0510  WBC 14.7*  HGB 7.4*  HCT 22.4*  PLT 274   ------------------------------------------------------------------------------------------------------------------  Chemistries  Recent Labs  Lab 10/14/2018 1710  10/24/18 0510  NA 126*   < > 128*  K 4.9   < > 4.3  CL 95*   < > 94*  CO2 20*   < > 24  GLUCOSE 141*   < > 116*  BUN 35*   < > 48*  CREATININE 1.53*   < > 2.02*  CALCIUM 8.8*   < > 8.4*  AST 52*  --   --   ALT 42  --   --   ALKPHOS 86  --   --   BILITOT 0.7  --   --    < > = values in this interval not displayed.   ------------------------------------------------------------------------------------------------------------------  Cardiac Enzymes Recent Labs  Lab 10/24/18 1143  TROPONINI 0.66*   ------------------------------------------------------------------------------------------------------------------  RADIOLOGY:  Dg Chest 2 View  Result Date: 10/18/2018 CLINICAL DATA:  Initial evaluation for acute shortness of breath, hypoxia. EXAM: CHEST - 2 VIEW COMPARISON:  Prior radiograph from 03/17/2018. FINDINGS: Cardiomegaly, similar to previous. Mediastinal silhouette within normal limits. Aortic atherosclerosis. Lungs are hypoinflated. Moderate layering  bilateral pleural effusions. Prominent perihilar vascular congestion with bilateral airspace opacities, most likely reflecting moderate diffuse pulmonary edema. Superimposed infection would be  difficult to exclude. No pneumothorax. No acute osseous finding. Diffuse spurring noted within the thoracolumbar spine. Prominent degenerative changes noted about the shoulders. IMPRESSION: 1. Cardiomegaly with bilateral pleural effusions and extensive bilateral airspace opacities, likely pulmonary edema. Superimposed infection would be difficult to exclude, and could be considered in the correct clinical setting. 2. Aortic atherosclerosis Electronically Signed   By: Jeannine Boga M.D.   On: 10/27/2018 16:42   US Venous Img Lower Unilateral Left  Result Date: 10/23/2018 CLINICAL DATA:  85 year old female with left lower extremity swelling and shortness of breath EXAM: LEFT LOWER EXTREMITY VENOUS DOPPLER ULTRASOUND TECHNIQUE: Gray-scale sonography with graded compression, as well as color Doppler and duplex ultrasound were performed to evaluate the lower extremity deep venous systems from the level of the common femoral vein and including the common femoral, femoral, profunda femoral, popliteal and calf veins including the posterior tibial, peroneal and gastrocnemius veins when visible. The superficial great saphenous vein was also interrogated. Spectral Doppler was utilized to evaluate flow at rest and with distal augmentation maneuvers in the common femoral, femoral and popliteal veins. COMPARISON:  None. FINDINGS: Contralateral Common Femoral Vein: Respiratory phasicity is normal and symmetric with the symptomatic side. No evidence of thrombus. Normal compressibility. Common Femoral Vein: No evidence of thrombus. Normal compressibility, respiratory phasicity and response to augmentation. Saphenofemoral Junction: No evidence of thrombus. Normal compressibility and flow on color Doppler imaging. Profunda Femoral Vein: No evidence of thrombus. Normal compressibility and flow on color Doppler imaging. Femoral Vein: No evidence of thrombus. Normal compressibility, respiratory phasicity and response to  augmentation. Popliteal Vein: No evidence of thrombus. Normal compressibility, respiratory phasicity and response to augmentation. Calf Veins: No evidence of thrombus. Normal compressibility and flow on color Doppler imaging. Superficial Great Saphenous Vein: No evidence of thrombus. Normal compressibility. Venous Reflux:  None. Other Findings:  None. IMPRESSION: No evidence of deep venous thrombosis. Electronically Signed   By: Jacqulynn Cadet M.D.   On: 10/23/2018 10:07    EKG:   Orders placed or performed during the hospital encounter of 11/02/2018   ED EKG   ED EKG    ASSESSMENT AND PLAN:   85 year old female with PMH of CKD stage III, GERD, hypertension, history of stroke who lives with her daughter, came in for worsening shortness of breath.  1.  Acute new onset congestive heart failure-fluid restriction, daily weights -Strict input and output monitoring.  appreciate cardiology consult. -Echocardiogram with normal EF and diastolic CHF -Lasix on hold due to worsening renal function  2.  Community-acquired pneumonia-follow-up blood cultures.  WBC is elevated.  Patient complaining of productive cough. -X-ray with pulmonary edema and may be underlying pneumonia. -Continue Rocephin and azithromycin -Blood cultures.  3.  NSTEMI-could be demand ischemia. -On heparin drip-discontinue heparin drip given anemia and dropping troponins. - Denies any active chest pain now.   -No plans for cardiac cath at this time -Continue aspirin, add beta-blocker and statin -Not a candidate for cardiac rehab given overall poor functioning  4.  Hyponatremia-secondary to underlying CHF.  -Lasix on hold.  Continue to monitor  5.  Acute renal failure on CKD stage III-IV: Nephrology has been consulted.  Creatinine is stable around 1.5.  Worse at 2.0 today -No prior outpatient follow-up. - Avoid nephrotoxins -Continue to monitor at this time  6.  DVT prophylaxis-started on subcutaneous heparin  7.  Anemia of chronic disease-hemoglobin dropped to 7.4, on heparin drip.  Discontinued heparin -No active indication for transfusion at this time.  Continue to monitor    physical therapy   She ambulates with a walker at baseline.   Palliative care consult requested given overall clinical decline within the last 2 months per daughter Ms. [name].    All the records are reviewed and case discussed with Care Management/Social Workerr. Management plans discussed with the patient, family and they are in agreement.  CODE STATUS: Partial Code  TOTAL TIME TAKING CARE OF THIS PATIENT: 41 minutes.   POSSIBLE D/C IN 2 DAYS, DEPENDING ON CLINICAL CONDITION.   Gladstone Lighter M.D on 10/24/2018 at 1:16 PM  Between 7am to 6pm - Pager - [phone]  After 6pm go to [email or web address] - password [email or web address] [facility]  Office  [phone]  CC: Primary care physician; Lavera Guise, MD

## 2018-10-24 NOTE — Plan of Care (Signed)

## 2018-10-24 NOTE — Progress Notes (Signed)
PHARMACY - PHYSICIAN COMMUNICATION CRITICAL VALUE ALERT - BLOOD CULTURE IDENTIFICATION (BCID)  [name] is an 85 y.o. female who presented to [facility] on 11/09/2018 with a chief complaint of shortness of breath.  Assessment:  Received call from lab @0945  Gram-Positive Streptococci (include suspected source if known)  Name of physician (or Provider) Contacted: Dr. Tressia Miners  Current antibiotics:  Azithromycin 500mg  PO daily Ceftriaxone 1g IV daily  Changes to prescribed antibiotics recommended:  Continue Azithromycin 500mg  PO daily x 3 days total Increase Ceftriaxone to 2g IV daily Recommendations accepted by provider  Results for orders placed or performed during the hospital encounter of 10/25/2018  Blood Culture ID Panel (Reflexed) (Collected: 10/23/2018  1:07 PM)  Result Value Ref Range   Enterococcus species NOT DETECTED NOT DETECTED   Listeria monocytogenes NOT DETECTED NOT DETECTED   Staphylococcus species NOT DETECTED NOT DETECTED   Staphylococcus aureus (BCID) NOT DETECTED NOT DETECTED   Streptococcus species DETECTED (A) NOT DETECTED   Streptococcus agalactiae NOT DETECTED NOT DETECTED   Streptococcus pneumoniae NOT DETECTED NOT DETECTED   Streptococcus pyogenes NOT DETECTED NOT DETECTED   Acinetobacter baumannii NOT DETECTED NOT DETECTED   Enterobacteriaceae species NOT DETECTED NOT DETECTED   Enterobacter cloacae complex NOT DETECTED NOT DETECTED   Escherichia coli NOT DETECTED NOT DETECTED   Klebsiella oxytoca NOT DETECTED NOT DETECTED   Klebsiella pneumoniae NOT DETECTED NOT DETECTED   Proteus species NOT DETECTED NOT DETECTED   Serratia marcescens NOT DETECTED NOT DETECTED   Haemophilus influenzae NOT DETECTED NOT DETECTED   Neisseria meningitidis NOT DETECTED NOT DETECTED   Pseudomonas aeruginosa NOT DETECTED NOT DETECTED   Candida albicans NOT DETECTED NOT DETECTED   Candida glabrata NOT DETECTED NOT DETECTED   Candida krusei NOT DETECTED NOT DETECTED    Candida parapsilosis NOT DETECTED NOT DETECTED   Candida tropicalis NOT DETECTED NOT DETECTED    [name] 10/24/2018  10:06 AM

## 2018-10-24 NOTE — Consult Note (Signed)
[place] for heparin Indication: chest pain/ACS  Allergies  Allergen Reactions  . Sulfa Antibiotics     Patient Measurements: Height: 5\' 2"  (157.5 cm) Weight: 173 lb (78.5 kg) IBW/kg (Calculated) : 50.1 Heparin Dosing Weight: 70.22 kg  Vital Signs: Temp: 98.8 F (37.1 C) (03/11 1944) Temp Source: Oral (03/11 1944) BP: 93/61 (03/11 1944) Pulse Rate: 67 (03/11 1944)  Labs: Recent Labs    10/29/2018 1605  10/18/2018 1710 11/03/2018 2131 10/23/18 0321 10/23/18 0912 10/23/18 1119 10/23/18 1640 10/23/18 2317  HGB 9.1*  --   --   --  8.4*  --   --   --   --   HCT 26.7*  --   --   --  24.8*  --   --   --   --   PLT 329  --   --   --  287  --   --   --   --   APTT  --   --   --   --  81*  --   --  121*  --   LABPROT  --   --   --   --  14.3  --   --  14.4  --   INR  --   --   --   --  1.1  --   --  1.1  --   HEPARINUNFRC  --   --   --   --  0.33  --  0.23*  --  0.29*  CREATININE  --   --  1.53*  --  1.59*  --   --   --   --   TROPONINI  --    < > 0.73* 0.78* 0.93* 1.14*  --   --   --    < > = values in this interval not displayed.    Estimated Creatinine Clearance: 23.3 mL/min (A) (by C-G formula based on SCr of 1.59 mg/dL (H)).   Medical History: Past Medical History:  Diagnosis Date  . Allergy   . Arthritis   . Carotid arterial disease ([place])    a. 2008 R CEA; b. 03/2018 s/p L CEA; c. 08/2018 Carotid U/S: RICA 6-29%, LICA 52-84%. Bilat ECA < 50%.  . CKD (chronic kidney disease), stage III ([place])   . Diastolic dysfunction    a. 03/2018 Echo: EF 55-60%, Gr1 DD. mIld AI/AS. Mild MR. MIldly dil LA.  Marland Kitchen GERD (gastroesophageal reflux disease)   . Hyperlipidemia   . Hypertension   . Insomnia   . Stroke [facility])     Medications:  Medications Prior to Admission  Medication Sig Dispense Refill Last Dose  . allopurinol (ZYLOPRIM) 100 MG tablet take 2 tablets by mouth at bedtime for gout 60 tablet 6 10/21/2018 at 1800  . ALPRAZolam (XANAX) 0.25  MG tablet Take 1 tablet (0.25 mg total) by mouth at bedtime as needed for anxiety. 30 tablet 3 10/21/2018 at 2100  . amLODipine (NORVASC) 2.5 MG tablet take 1 tablet by mouth at lunch 30 tablet 6 11/12/2018 at 1200  . aspirin EC 81 MG EC tablet Take 1 tablet (81 mg total) by mouth daily. 30 tablet 5 10/21/2018 at 1800  . benazepril (LOTENSIN) 5 MG tablet Take 1 tablet (5 mg total) by mouth 2 (two) times daily. 60 tablet 6 10/19/2018 at 0630  . Cholecalciferol (VITAMIN D3) 2000 units capsule Take 2,000 Units by mouth daily.    11/04/2018 at  0630  . cloNIDine (CATAPRES - DOSED IN MG/24 HR) 0.2 mg/24hr patch apply 1 patch every week 4 patch 5 Past Week at Unknown time  . clopidogrel (PLAVIX) 75 MG tablet Take 1 tablet (75 mg total) by mouth daily with breakfast. 30 tablet 3 11/02/2018 at 0630  . conjugated estrogens (PREMARIN) vaginal cream Place 1 Applicatorful vaginally once a week. At night.   prn at prn  . cyanocobalamin (,VITAMIN B-12,) 1000 MCG/ML injection INJECT 1 MILLILITER INTRAMUSCULARLY ONCE A MONTH 1 mL 3 Past Month at Unknown time  . diclofenac sodium (VOLTAREN) 1 % GEL Apply 2 g topically 2 (two) times daily.   prn at prn  . estrogens, conjugated, (PREMARIN) 0.625 MG tablet Take 0.625 mg by mouth daily. Take daily for 21 days then do not take for 7 days.   prn at prn  . FEROSUL 325 (65 Fe) MG tablet take 1 tablet by mouth twice a day (Patient taking differently: Take 325 mg by mouth daily. ) 60 tablet 5 10/15/2018 at 0630  . Multiple Vitamins-Minerals (CENTRUM SILVER 50+WOMEN) TABS Take 1 tablet by mouth daily.   11/02/2018 at 0630  . omeprazole (PRILOSEC) 20 MG capsule take 1 capsule by mouth once daily FOR BELCHING AND STOMACH. 30 capsule 5 11/12/2018 at 0630  . pravastatin (PRAVACHOL) 20 MG tablet Take 1 tablet (20 mg total) by mouth daily. 30 tablet 6 10/21/2018 at 1800  . triamcinolone (KENALOG) 0.025 % cream Apply 1 application topically 2 (two) times daily. 80 g 2 prn at prn  . Olopatadine HCl  0.2 % SOLN Apply 1 drop to eye daily. 2.5 mL 5 prn at prn   Scheduled:  . allopurinol  200 mg Oral QHS  . aspirin EC  81 mg Oral Daily  . atorvastatin  40 mg Oral q1800  . azithromycin  500 mg Oral Daily  . carvedilol  3.125 mg Oral BID WC  . cholecalciferol  2,000 Units Oral Daily  . clopidogrel  75 mg Oral Q breakfast  . mouth rinse  15 mL Mouth Rinse BID  . multivitamin with minerals  1 tablet Oral Daily  . nitroGLYCERIN  0.5 inch Topical Q6H  . olopatadine  1 drop Both Eyes BID  . pantoprazole  40 mg Oral Daily  . sodium chloride flush  3 mL Intravenous Q12H   Infusions:  . sodium chloride    . cefTRIAXone (ROCEPHIN)  IV Stopped (10/23/18 1436)  . heparin 900 Units/hr (10/23/18 1926)   PRN:   Assessment: Pharmacy was consulted to start heparin for ACS. No DOAC PTA noted. Likely a NSTEMI picture.    03/11 @ 0320 HL 0.33 03/11 @ 1119 HL 0.23  Goal of Therapy:  Heparin level 0.3-0.7 units/ml Monitor platelets by anticoagulation protocol: Yes   Plan:  03/12 @ 0000 HL 0.29 slightly subtherapeutic. Will increase rate to 1000 units/hr and will recheck HL @ 0800.  Tobie Lords, PharmD, BCPS Clinical Pharmacist 10/24/2018

## 2018-10-24 NOTE — TOC Progression Note (Signed)
Transition of Care [facility]) - Progression Note    Patient Details  Name: [name] MRN: [id number] Date of Birth: 10/13/1926  [facility] of [facility] CM/SW Contact  Elza Rafter, RN Phone Number: 10/24/2018, 4:04 PM  Clinical Narrative:   Out patient Palliative referral received.  Notified Flo Shanks, RN.  Patient will resume home health services through [facility].      Expected Discharge Plan: [place]    Expected Discharge Plan and Services Expected Discharge Plan: [place] Discharge Planning Services: CM Consult, HF Clinic Post Acute Care Choice: [place] arrangements for the past 2 months: Single Family Home                     HH Arranged: RN, PT, Nurse's Aide [place] Agency: [facility]   Social Determinants of Health (SDOH) Interventions    Readmission Risk Interventions 30 Day Unplanned Readmission Risk Score     ED to Hosp-Admission (Current) from 10/27/2018 in [place] (2A)  30 Day Unplanned Readmission Risk Score (%)  20 Filed at 10/24/2018 1600     This score is the patient's risk of an unplanned readmission within 30 days of being discharged (0 -100%). The score is based on dignosis, age, lab data, medications, orders, and past utilization.   Low:  0-14.9   Medium: 15-21.9   High: 22-29.9   Extreme: 30 and above       No flowsheet data found.

## 2018-10-24 NOTE — Consult Note (Signed)
[place] for heparin Indication: chest pain/ACS  Allergies  Allergen Reactions  . Sulfa Antibiotics     Patient Measurements: Height: 5\' 2"  (157.5 cm) Weight: 169 lb 3.2 oz (76.7 kg) IBW/kg (Calculated) : 50.1 Heparin Dosing Weight: 70.22 kg  Vital Signs: Temp: 97.5 F (36.4 C) (03/12 0344) Temp Source: Oral (03/12 0344) BP: 99/79 (03/12 0612) Pulse Rate: 63 (03/12 0612)  Labs: Recent Labs    10/31/2018 1605  11/01/2018 1710 10/15/2018 2131  10/23/18 0321 10/23/18 0912 10/23/18 1119 10/23/18 1640 10/23/18 2317 10/24/18 0510 10/24/18 0808  HGB 9.1*  --   --   --   --  8.4*  --   --   --   --  7.4*  --   HCT 26.7*  --   --   --   --  24.8*  --   --   --   --  22.4*  --   PLT 329  --   --   --   --  287  --   --   --   --  274  --   APTT  --   --   --   --   --  81*  --   --  121*  --  151*  --   LABPROT  --   --   --   --   --  14.3  --   --  14.4  --  14.7  --   INR  --   --   --   --   --  1.1  --   --  1.1  --  1.2  --   HEPARINUNFRC  --   --   --   --    < > 0.33  --  0.23*  --  0.29*  --  0.38  CREATININE  --   --  1.53*  --   --  1.59*  --   --   --   --  2.02*  --   TROPONINI  --    < > 0.73* 0.78*  --  0.93* 1.14*  --   --   --   --   --    < > = values in this interval not displayed.    Estimated Creatinine Clearance: 18.1 mL/min (A) (by C-G formula based on SCr of 2.02 mg/dL (H)).   Medical History: Past Medical History:  Diagnosis Date  . Allergy   . Arthritis   . Carotid arterial disease ([place])    a. 2008 R CEA; b. 03/2018 s/p L CEA; c. 08/2018 Carotid U/S: RICA 1-61%, LICA 09-60%. Bilat ECA < 50%.  . CKD (chronic kidney disease), stage III ([place])   . Diastolic dysfunction    a. 03/2018 Echo: EF 55-60%, Gr1 DD. mIld AI/AS. Mild MR. MIldly dil LA.  Marland Kitchen GERD (gastroesophageal reflux disease)   . Hyperlipidemia   . Hypertension   . Insomnia   . Stroke [facility])     Medications:  Medications Prior to Admission   Medication Sig Dispense Refill Last Dose  . allopurinol (ZYLOPRIM) 100 MG tablet take 2 tablets by mouth at bedtime for gout 60 tablet 6 10/21/2018 at 1800  . ALPRAZolam (XANAX) 0.25 MG tablet Take 1 tablet (0.25 mg total) by mouth at bedtime as needed for anxiety. 30 tablet 3 10/21/2018 at 2100  . amLODipine (NORVASC) 2.5 MG tablet take 1 tablet by mouth at lunch  30 tablet 6 10/25/2018 at 1200  . aspirin EC 81 MG EC tablet Take 1 tablet (81 mg total) by mouth daily. 30 tablet 5 10/21/2018 at 1800  . benazepril (LOTENSIN) 5 MG tablet Take 1 tablet (5 mg total) by mouth 2 (two) times daily. 60 tablet 6 11/02/2018 at 0630  . Cholecalciferol (VITAMIN D3) 2000 units capsule Take 2,000 Units by mouth daily.    10/21/2018 at 0630  . cloNIDine (CATAPRES - DOSED IN MG/24 HR) 0.2 mg/24hr patch apply 1 patch every week 4 patch 5 Past Week at Unknown time  . clopidogrel (PLAVIX) 75 MG tablet Take 1 tablet (75 mg total) by mouth daily with breakfast. 30 tablet 3 11/07/2018 at 0630  . conjugated estrogens (PREMARIN) vaginal cream Place 1 Applicatorful vaginally once a week. At night.   prn at prn  . cyanocobalamin (,VITAMIN B-12,) 1000 MCG/ML injection INJECT 1 MILLILITER INTRAMUSCULARLY ONCE A MONTH 1 mL 3 Past Month at Unknown time  . diclofenac sodium (VOLTAREN) 1 % GEL Apply 2 g topically 2 (two) times daily.   prn at prn  . estrogens, conjugated, (PREMARIN) 0.625 MG tablet Take 0.625 mg by mouth daily. Take daily for 21 days then do not take for 7 days.   prn at prn  . FEROSUL 325 (65 Fe) MG tablet take 1 tablet by mouth twice a day (Patient taking differently: Take 325 mg by mouth daily. ) 60 tablet 5 11/12/2018 at 0630  . Multiple Vitamins-Minerals (CENTRUM SILVER 50+WOMEN) TABS Take 1 tablet by mouth daily.   10/30/2018 at 0630  . omeprazole (PRILOSEC) 20 MG capsule take 1 capsule by mouth once daily FOR BELCHING AND STOMACH. 30 capsule 5 10/17/2018 at 0630  . pravastatin (PRAVACHOL) 20 MG tablet Take 1 tablet (20  mg total) by mouth daily. 30 tablet 6 10/21/2018 at 1800  . triamcinolone (KENALOG) 0.025 % cream Apply 1 application topically 2 (two) times daily. 80 g 2 prn at prn  . Olopatadine HCl 0.2 % SOLN Apply 1 drop to eye daily. 2.5 mL 5 prn at prn   Scheduled:  . allopurinol  200 mg Oral QHS  . aspirin EC  81 mg Oral Daily  . atorvastatin  40 mg Oral q1800  . azithromycin  500 mg Oral Daily  . carvedilol  3.125 mg Oral BID WC  . cholecalciferol  2,000 Units Oral Daily  . clopidogrel  75 mg Oral Q breakfast  . mouth rinse  15 mL Mouth Rinse BID  . multivitamin with minerals  1 tablet Oral Daily  . nitroGLYCERIN  0.5 inch Topical Q6H  . olopatadine  1 drop Both Eyes BID  . pantoprazole  40 mg Oral Daily  . sodium chloride flush  3 mL Intravenous Q12H   Infusions:  . sodium chloride    . cefTRIAXone (ROCEPHIN)  IV Stopped (10/23/18 1436)  . heparin 1,000 Units/hr (10/24/18 0018)   PRN:   Assessment: Pharmacy was consulted to start heparin for ACS. No DOAC PTA noted. Likely a NSTEMI picture.    03/11 @ 0320 HL 0.33 03/11 @ 1119 HL 0.23 03/12 @ 0813 HL 0.38  Goal of Therapy:  Heparin level 0.3-0.7 units/ml Monitor platelets by anticoagulation protocol: Yes   Plan:  The heparin level is currently therapeutic. Will continue rate of 1000 units/hr and will recheck HL @ 1700.  Rito Ehrlich, PharmD Clinical Pharmacist 10/24/2018

## 2018-10-24 NOTE — Progress Notes (Signed)
Cardiovascular and Pulmonary Nurse Navigator Note:    Follow-up to education provided on 10/23/2018. Patient's daughter, [name], is asking if patient will need to watch her salt and be on a low sodium diet until the end of her life.  This RN explained to daughter that CHF is a chronic illness which requires these steps to control the illness and prevent fluid build-up. Reminded daughter the fluid restriction is a part of management as well as taking cardiac medications.  Reviewed the 5 steps to Living Better with Heart Failure with patient and daughter.  Daughter thanked me for answering her questions.    Roanna Epley, RN, BSN, [facility]  Cardiovascular & Pulmonary Nurse Navigator  Direct Line: [phone]  Department Phone #: [phone] Fax: [phone]  Email Address: [email or web address]

## 2018-10-24 NOTE — Consult Note (Addendum)
Progress Note  Patient Name: [name] Date of Encounter: 10/24/2018  Primary Cardiologist: Ida Rogue, MD  Subjective   [name], slept better overnight without any chest discomfort until this morning.  This morning she describes unchanged exertional symptoms of mid-sternal chest "tightness" as if she had a "bra on too tight" that lasted about 5-10 minutes with associated dyspnea.  These symptoms occurred with transfer to the bedside commode.  She is still coughing up yellow thick sputum and reports feeling weak, but was able to tolerate sitting up on the side of the bed for breakfast.  She denied dizziness, syncope, pnd, n, v, orthopnea, and early satiety.  Inpatient Medications    Scheduled Meds:  allopurinol  200 mg Oral QHS   aspirin EC  81 mg Oral Daily   atorvastatin  40 mg Oral q1800   azithromycin  500 mg Oral Daily   carvedilol  3.125 mg Oral BID WC   cholecalciferol  2,000 Units Oral Daily   clopidogrel  75 mg Oral Q breakfast   mouth rinse  15 mL Mouth Rinse BID   multivitamin with minerals  1 tablet Oral Daily   nitroGLYCERIN  0.5 inch Topical Q6H   olopatadine  1 drop Both Eyes BID   pantoprazole  40 mg Oral Daily   sodium chloride flush  3 mL Intravenous Q12H   Continuous Infusions:  sodium chloride     cefTRIAXone (ROCEPHIN)  IV     [START ON 10/25/2018] cefTRIAXone (ROCEPHIN)  IV     heparin 1,000 Units/hr (10/24/18 0018)   PRN Meds: sodium chloride, acetaminophen, ALPRAZolam, nitroGLYCERIN, ondansetron (ZOFRAN) IV, sodium chloride flush   Vital Signs    Vitals:   10/24/18 0104 10/24/18 0344 10/24/18 0612 10/24/18 0952  BP: (!) 104/49 (!) 100/46 99/79 (!) 102/47  Pulse: 68 67 63 71  Resp: 18 18  18   Temp:  (!) 97.5 F (36.4 C)  (!) 97.2 F (36.2 C)  TempSrc:  Oral    SpO2: 94% 97%  97%  Weight:  76.7 kg    Height:        Intake/Output Summary (Last 24 hours) at 10/24/2018 1258 Last data filed at 10/24/2018  1052 Gross per 24 hour  Intake 588.28 ml  Output 1000 ml  Net -411.72 ml   Filed Weights   10/25/2018 2115 10/23/18 0324 10/24/18 0344  Weight: 78.5 kg 78.5 kg 76.7 kg    Physical Exam   GEN: Well nourished, well developed, in no acute distress.  HEENT: Grossly normal.  Neck: Supple, no JVD, carotid bruits, or masses. Cardiac: RRR, no murmurs, rubs, or gallops. No clubbing, cyanosis, edema.  Radials/PT 2+ and equal bilaterally.  Respiratory:  +productive cough, Left sided wheezes auscultated throughout, RUL clear with diminished breath sounds in the bases bilaterally.  Respirations regular & unlabored. GI: Soft, nontender, nondistended, BS + x 4. MS: +generalized weakness. no deformity or atrophy. Skin: warm and dry, no rash. Neuro:  Strength and sensation are intact. Psych: AAOx3.  Normal affect.  Labs    Chemistry Recent Labs  Lab 10/23/2018 1710 10/15/2018 2131 10/23/18 0321 10/24/18 0510  NA 126* 128* 130* 128*  K 4.9  --  4.5 4.3  CL 95*  --  96* 94*  CO2 20*  --  22 24  GLUCOSE 141*  --  136* 116*  BUN 35*  --  36* 48*  CREATININE 1.53*  --  1.59* 2.02*  CALCIUM 8.8*  --  9.1  8.4*  PROT 6.9  --   --   --   ALBUMIN 3.3*  --   --   --   AST 52*  --   --   --   ALT 42  --   --   --   ALKPHOS 86  --   --   --   BILITOT 0.7  --   --   --   GFRNONAA 30*  --  28* 21*  GFRAA 35*  --  33* 25*  ANIONGAP 11  --  12 10     Hematology Recent Labs  Lab 11/09/2018 1605 10/23/18 0321 10/24/18 0510  WBC 13.0* 17.0* 14.7*  RBC 2.78* 2.60* 2.33*  HGB 9.1* 8.4* 7.4*  HCT 26.7* 24.8* 22.4*  MCV 96.0 95.4 96.1  MCH 32.7 32.3 31.8  MCHC 34.1 33.9 33.0  RDW 13.6 13.5 13.4  PLT 329 287 274    Cardiac Enzymes Recent Labs  Lab 11/04/2018 2131 10/23/18 0321 10/23/18 0912 10/24/18 1143  TROPONINI 0.78* 0.93* 1.14* 0.66*     BNP Recent Labs  Lab 10/27/2018 1605  BNP 801.0*      Radiology    Dg Chest 2 View  Result Date: 10/13/2018 CLINICAL DATA:  Initial  evaluation for acute shortness of breath, hypoxia. EXAM: CHEST - 2 VIEW COMPARISON:  Prior radiograph from 03/17/2018. FINDINGS: Cardiomegaly, similar to previous. Mediastinal silhouette within normal limits. Aortic atherosclerosis. Lungs are hypoinflated. Moderate layering bilateral pleural effusions. Prominent perihilar vascular congestion with bilateral airspace opacities, most likely reflecting moderate diffuse pulmonary edema. Superimposed infection would be difficult to exclude. No pneumothorax. No acute osseous finding. Diffuse spurring noted within the thoracolumbar spine. Prominent degenerative changes noted about the shoulders. IMPRESSION: 1. Cardiomegaly with bilateral pleural effusions and extensive bilateral airspace opacities, likely pulmonary edema. Superimposed infection would be difficult to exclude, and could be considered in the correct clinical setting. 2. Aortic atherosclerosis Electronically Signed   By: Jeannine Boga M.D.   On: 11/01/2018 16:42   US Venous Img Lower Unilateral Left  Result Date: 10/23/2018 CLINICAL DATA:  85 year old female with left lower extremity swelling and shortness of breath EXAM: LEFT LOWER EXTREMITY VENOUS DOPPLER ULTRASOUND TECHNIQUE: Gray-scale sonography with graded compression, as well as color Doppler and duplex ultrasound were performed to evaluate the lower extremity deep venous systems from the level of the common femoral vein and including the common femoral, femoral, profunda femoral, popliteal and calf veins including the posterior tibial, peroneal and gastrocnemius veins when visible. The superficial great saphenous vein was also interrogated. Spectral Doppler was utilized to evaluate flow at rest and with distal augmentation maneuvers in the common femoral, femoral and popliteal veins. COMPARISON:  None. FINDINGS: Contralateral Common Femoral Vein: Respiratory phasicity is normal and symmetric with the symptomatic side. No evidence of thrombus.  Normal compressibility. Common Femoral Vein: No evidence of thrombus. Normal compressibility, respiratory phasicity and response to augmentation. Saphenofemoral Junction: No evidence of thrombus. Normal compressibility and flow on color Doppler imaging. Profunda Femoral Vein: No evidence of thrombus. Normal compressibility and flow on color Doppler imaging. Femoral Vein: No evidence of thrombus. Normal compressibility, respiratory phasicity and response to augmentation. Popliteal Vein: No evidence of thrombus. Normal compressibility, respiratory phasicity and response to augmentation. Calf Veins: No evidence of thrombus. Normal compressibility and flow on color Doppler imaging. Superficial Great Saphenous Vein: No evidence of thrombus. Normal compressibility. Venous Reflux:  None. Other Findings:  None. IMPRESSION: No evidence of deep venous thrombosis. Electronically Signed  By: Jacqulynn Cadet M.D.   On: 10/23/2018 10:07    Telemetry    NSR, 65, with wide 1st degree HB, no acute events overnight - Personally Reviewed  ECG    Sinus Tachycardia, 101, with a wide 1st degree HB (PR 280 msec), rightward axis, septal infarct (new), inflat ST depression w/ T flattening (new) - Personally Reviewed  Cardiac Studies   Echocardiogram 10/23/2018  - LV EF 50-55% - mild LVH, LA mildly dilated - RA/RV normal  - Right ventricular systolic pressure mildly elevated @ 31.5 mmHg - Aortic valve: moderate calcification, mild AR - Pulmonic valve not well visualized - no other valve abnormalities noted  Patient Profile     85 y.o. female with a history of HLD, HTN, carotid arterial disease s/p bilateral CEA, CVA, & CKD stage III admitted and being evaluated with NSTEMI at the request of Dr. Estanislado Pandy.  Assessment & Plan    1. NSTEMI: She has been experiencing intermittent exertional chest tightness with dyspnea since December 2019 that lasts 10-15 minutes and resolves with rest.  Previous Echo in August 2019  showed nl LV function, follow up echo 10/23/2018 again showed nl LV function with no wall abnormality.   Reassured that EF is normal, ECG on admission showed new septal infarct and inflat ischemic changes.  Trop now downtrending: 0.73-0.78-0.93-1.14  0.66. Continue heparin drip & nitro paste (for angina symptoms), as well as home doses of carvedilol/clopidogrel/aspirin.  No c/o chest pain overnight but did admit to continued exertional chest discomfort this morning with associated dyspnea that lasted about 5-10 min.  Family discussing ischemic work up options presented yesterday that might potentially include a heart catheterization.  Any ischemic work up will wait until respiratory and renal statuses improved.  Cont heparin today w/ plan to d/c in AM. Can likely look to transition ntp to imdur.  2. Acute HFpEF: Previous echo in August 2019 showed nl EF with Gr1DD, echo done 10/23/2018 again showed preserved EF of 50-55% with no wall motion abnormality.  CXR on admission showed edema, effusions & pna.  She appears to have had significant weight gain of 6 kg since December and reports BLE swelling.  She received IV Lasix and lost 2 kg over last 24 hours, appears euvolemic on exam.  Exam challenging due to infectious respiratory process and body habitus.  Discontinued lasix this AM due to rise in Cr: 2.02 this AM from 1.59.  Will need follow up CXR to re-evaluate effusions post antibiotics & diuresis as she may require thoracentesis.  Continue carvedilol.  Benazepril stopped in setting of worsening renal fxn.  3. Acute Respiratory Failure/CAP: Productive cough w/ yellow sputum since 08/07/2018 s/p course of amoxicillin in January 2020 for presumed sinusitis with slight improvement.  Pt reports history of fevers, chills, nausea worsening over 2 weeks prior to admission.  Hypoxic on arrival to ED 10/17/2018, CXR abnormal with edema, bilateral effusions and possible pneumonia.  Antibiotic management by IM, cultures  ordered and pending.  Lung sounds not as diminished this AM, with wheezing on L side, pt still requiring oxygen.  Currently afebrile, WBC this AM 14.7 down from 17k.  Clinically feeling better.  4. Hypertension:  BP remains well controlled after discontinuation of benazepril, continue carvedilol & nitroglycerin paste (added to control angina symptoms).   5. Carotid arterial disease:  Continue on clopidogrel & aspirin.  Followed by Dr. Lucky Cowboy outpatient.  6. CVA:  Continue aspirin/clopidogrel/atorvastatin regimen.  BP remains well controlled after discontinuation of benazepril.  7. Chronic Kidney Disease Stage III:  Cr elevated this morning from 1.59 to 2.02 - prerenal following diuresis.  Lasix discontinued, no noticeable swelling on physical exam, 2 kg loss of weight in 24 hours.  Avoid nephrotoxic agents, follow renal function daily.  Nephrology following.  7. Hyperlipidemia: Current LDL this admission 74, changed to atorvastatin 40 mg QD, recommend f/u lipid panel in 6 weeks.  8. Hyponatremia: Persists after diuresis with no noticeable swelling on PE and 2 kg weight loss in 24 hours.  Defer to IM and Nephrology for management.  Signed, Domingo Pulse [place], Student - NP  10/24/2018, 12:58 PM    For questions or updates, please contact   Please consult [email or web address] for contact info under Cardiology/STEMI.

## 2018-10-24 NOTE — Progress Notes (Signed)
New referral for out patient palliative to follow at home received from [facility]. Patient information faxed to referral. Plan is for discharge with [facility] home health. Flo Shanks BSN, RN, [facility] Liaison [facility] ([facility] [place]) [phone]

## 2018-10-24 NOTE — Consult Note (Addendum)
Consultation Note Date: 10/24/2018   Patient Name: [name]  DOB: 25-Dec-1926  MRN: [id number]  Age / Sex: 85 y.o., female  PCP: Lavera Guise, MD Referring Physician: Gladstone Lighter, MD  Reason for Consultation: Establishing goals of care  HPI/Patient Profile: [name]  is a 85 y.o. female with a known history of CKD stage III, hyperlipidemia, hypertension, CVA in the past, arthritis presented to the emergency room for shortness of breath.  Patient also is has swelling in the lower extremities left leg more than right leg.  She feels more short of breath and is orthopneic. New diagnosis of CHF.  Clinical Assessment and Goals of Care: Patient is resting in bed. Her daughters are at bedside. [name] has been married 3 times. She has 3 sons from her first marriage who she has not had a relationship with since childhood. She has 3 daughters from her second marriage, one of which she does not see frequently. She lives with her daughter [name] who is her POA.   She has lived with [name] for 6 years since having a stroke. She was unable to drive from that point. She spends her time now reading her Bible and watching t.v. She uses a walker. She only leaves the house to go to hair appointments and the doctor. She states she does not eat well, however she has not lost weight.    We discussed her diagnoses at length as CHF is a new daignosis, prognosis, GOC, EOL wishes disposition and options. We discussed her CHF, CKD with AKI, and pulmonary issues.  She enjoys drinking several very large cooler style cups of water per day. Her daughters express concern with her new fluid restrictions 2/2 CHF. [name] states she does not cook with a lot of salt, but adds her mother enjoys country ham, sausage, bacon, fried chicken, and other salty foods/ We explored what this may mean to her quality of life.   She states  at times she has issues with swallowing. She states she would not want to alter the consistencies of her food or liquids and would not want a feeding tube.   A detailed discussion was had today regarding advanced directives.  Concepts specific to code status, artifical feeding and hydration, IV antibiotics and rehospitalization were discussed.  The difference between an aggressive medical intervention path and a comfort care path was discussed.  Values and goals of care important to patient and family were attempted to be elicited.   She states she would never want to go to a nursing home, [name] agrees she would never want nursing home placement either.    Discussed limitations of medical interventions to prolong quality of life for this patient at this time in this situation and discussed the concept of human mortality.  Concepts of palliative care and hospice care were discussed.  She states she wants to return to the hospital if needed. She wants any care indicated, that she does not want "to give up". She discusses her 3rd husband  and his care, as he had cancer and they were able to talk about their wishes at that time. Her daughter [name] states she does not want her mother to suffer, and will honor whatever wishes her mother has. She states she would not want to be placed on a ventilator for respiratory distress such as with PNA or heart failure. She would not want chest compressions, shocks, or a breathing tube for cardio/pulmonary arrest stating "I want to serve the Lord until he calls me home. When he's ready to call me home, let me go." She states that if she becomes unable to make decisions, she would want to focus on comfort.    [name] is her POA.   I completed a MOST form today and the signed original was placed in the chart. A photocopy was placed in the chart to be scanned into EMR. The patient outlined their wishes for the following treatment decisions:  Cardiopulmonary  Resuscitation: Do Not Attempt Resuscitation (DNR/No CPR)  Medical Interventions: Limited Additional Interventions: Use medical treatment, IV fluids and cardiac monitoring as indicated, DO NOT USE intubation or mechanical ventilation. May consider use of less invasive airway support such as BiPAP or CPAP. Also provide comfort measures. Transfer to the hospital if indicated. Avoid intensive care.   Antibiotics: Antibiotics if indicated  IV Fluids: IV fluids if indicated  Feeding Tube: No feeding tube      SUMMARY OF RECOMMENDATIONS   Palliative to follow outpatient.   Code Status/Advance Care Planning:  DNR  Prognosis:   Poor overall  Discharge Planning: Home with Palliative Services      Primary Diagnoses: Present on Admission: . NSTEMI (non-ST elevated myocardial infarction) ([place])   I have reviewed the medical record, interviewed the patient and family, and examined the patient. The following aspects are pertinent.  Past Medical History:  Diagnosis Date  . Allergy   . Arthritis   . Carotid arterial disease ([place])    a. 2008 R CEA; b. 03/2018 s/p L CEA; c. 08/2018 Carotid U/S: RICA 1-61%, LICA 09-60%. Bilat ECA < 50%.  . CKD (chronic kidney disease), stage III ([place])   . Diastolic dysfunction    a. 03/2018 Echo: EF 55-60%, Gr1 DD. mIld AI/AS. Mild MR. MIldly dil LA.  Marland Kitchen GERD (gastroesophageal reflux disease)   . Hyperlipidemia   . Hypertension   . Insomnia   . Stroke [facility])    Social History   Socioeconomic History  . Marital status: Widowed    Spouse name: Not on file  . Number of children: Not on file  . Years of education: Not on file  . Highest education level: Not on file  Occupational History  . Not on file  Social Needs  . Financial resource strain: Not on file  . Food insecurity:    Worry: Not on file    Inability: Not on file  . Transportation needs:    Medical: Not on file    Non-medical: Not on file  Tobacco Use  . Smoking status: Never Smoker  .  Smokeless tobacco: Never Used  Substance and Sexual Activity  . Alcohol use: No  . Drug use: No  . Sexual activity: Not on file  Lifestyle  . Physical activity:    Days per week: Not on file    Minutes per session: Not on file  . Stress: Not on file  Relationships  . Social connections:    Talks on phone: Not on file    Gets together:  Not on file    Attends religious service: Not on file    Active member of club or organization: Not on file    Attends meetings of clubs or organizations: Not on file    Relationship status: Not on file  Other Topics Concern  . Not on file  Social History Narrative  . Not on file   Family History  Problem Relation Age of Onset  . Congestive Heart Failure Mother   . Heart disease Mother   . Heart disease Father   . Diabetes Brother    Scheduled Meds: . allopurinol  200 mg Oral QHS  . aspirin EC  81 mg Oral Daily  . atorvastatin  40 mg Oral q1800  . azithromycin  500 mg Oral Daily  . carvedilol  3.125 mg Oral BID WC  . cholecalciferol  2,000 Units Oral Daily  . clopidogrel  75 mg Oral Q breakfast  . heparin injection (subcutaneous)  5,000 Units Subcutaneous Q8H  . mouth rinse  15 mL Mouth Rinse BID  . multivitamin with minerals  1 tablet Oral Daily  . nitroGLYCERIN  0.5 inch Topical Q6H  . olopatadine  1 drop Both Eyes BID  . pantoprazole  40 mg Oral Daily  . sodium chloride flush  3 mL Intravenous Q12H   Continuous Infusions: . sodium chloride    . cefTRIAXone (ROCEPHIN)  IV    . [START ON 10/25/2018] cefTRIAXone (ROCEPHIN)  IV     PRN Meds:.sodium chloride, acetaminophen, ALPRAZolam, nitroGLYCERIN, ondansetron (ZOFRAN) IV, sodium chloride flush Medications Prior to Admission:  Prior to Admission medications   Medication Sig Start Date End Date Taking? Authorizing Provider  allopurinol (ZYLOPRIM) 100 MG tablet take 2 tablets by mouth at bedtime for gout 05/28/18  Yes Ronnell Freshwater, NP  ALPRAZolam (XANAX) 0.25 MG tablet Take 1  tablet (0.25 mg total) by mouth at bedtime as needed for anxiety. 07/23/18  Yes Ronnell Freshwater, NP  amLODipine (NORVASC) 2.5 MG tablet take 1 tablet by mouth at lunch 05/07/18  Yes Boscia, Heather E, NP  aspirin EC 81 MG EC tablet Take 1 tablet (81 mg total) by mouth daily. 03/30/18  Yes Dew, Erskine Squibb, MD  benazepril (LOTENSIN) 5 MG tablet Take 1 tablet (5 mg total) by mouth 2 (two) times daily. 05/28/18  Yes Ronnell Freshwater, NP  Cholecalciferol (VITAMIN D3) 2000 units capsule Take 2,000 Units by mouth daily.    Yes [provider]  cloNIDine (CATAPRES - DOSED IN MG/24 HR) 0.2 mg/24hr patch apply 1 patch every week 09/12/18  Yes Boscia, Greer Ee, NP  clopidogrel (PLAVIX) 75 MG tablet Take 1 tablet (75 mg total) by mouth daily with breakfast. 09/02/18  Yes Boscia, Heather E, NP  conjugated estrogens (PREMARIN) vaginal cream Place 1 Applicatorful vaginally once a week. At night.   Yes [provider]  cyanocobalamin (,VITAMIN B-12,) 1000 MCG/ML injection INJECT 1 MILLILITER INTRAMUSCULARLY ONCE A MONTH 10/17/18  Yes Boscia, Heather E, NP  diclofenac sodium (VOLTAREN) 1 % GEL Apply 2 g topically 2 (two) times daily. 10/21/18  Yes [provider]  estrogens, conjugated, (PREMARIN) 0.625 MG tablet Take 0.625 mg by mouth daily. Take daily for 21 days then do not take for 7 days.   Yes [provider]  FEROSUL 325 (65 Fe) MG tablet take 1 tablet by mouth twice a day Patient taking differently: Take 325 mg by mouth daily.  11/12/17  Yes Ronnell Freshwater, NP  Multiple Vitamins-Minerals (CENTRUM  SILVER 50+WOMEN) TABS Take 1 tablet by mouth daily.   Yes [provider]  omeprazole (PRILOSEC) 20 MG capsule take 1 capsule by mouth once daily FOR BELCHING AND STOMACH. 10/11/18  Yes Boscia, Heather E, NP  pravastatin (PRAVACHOL) 20 MG tablet Take 1 tablet (20 mg total) by mouth daily. 09/12/18  Yes Boscia, Heather E, NP  triamcinolone (KENALOG) 0.025 % cream Apply 1  application topically 2 (two) times daily. 11/09/17  Yes Boscia, Heather E, NP  Olopatadine HCl 0.2 % SOLN Apply 1 drop to eye daily. 07/23/18   Ronnell Freshwater, NP   Allergies  Allergen Reactions  . Sulfa Antibiotics    Review of Systems  All other systems reviewed and are negative.   Physical Exam Neurological:     Mental Status: She is alert.     Comments: Oriented.      Vital Signs: BP (!) 102/47   Pulse 71   Temp (!) 97.2 F (36.2 C)   Resp 18   Ht 5\' 2"  (1.575 m)   Wt 76.7 kg   SpO2 97%   BMI 30.95 kg/m  Pain Scale: 0-10   Pain Score: 0-No pain   SpO2: SpO2: 97 % O2 Device:SpO2: 97 % O2 Flow Rate: .O2 Flow Rate (L/min): 6 L/min  IO: Intake/output summary:   Intake/Output Summary (Last 24 hours) at 10/24/2018 1555 Last data filed at 10/24/2018 1052 Gross per 24 hour  Intake 445.19 ml  Output 500 ml  Net -54.81 ml    LBM: Last BM Date: 10/21/18 Baseline Weight: Weight: 74.8 kg Most recent weight: Weight: 76.7 kg     Palliative Assessment/Data:     Time In: 1:00 Time Out: 4:15 Time Total: 3 hours 15 minutes Greater than 50%  of this time was spent counseling and coordinating care related to the above assessment and plan.  Signed by: Asencion Gowda, NP   Please contact Palliative Medicine Team phone at [phone] for questions and concerns.  For individual provider: See Shea Evans

## 2018-10-24 NOTE — Progress Notes (Signed)
PT Cancellation Note  Patient Details Name: ADELYN ROSCHER MRN: [id number] DOB: 01-16-1927   Cancelled Treatment:    Reason Eval/Treat Not Completed: Other (comment): Attempted to see pt for PT evaluation x 2 this date with pt in meetings with palliative care and then receiving education from CHF nurse.  Will attempt to see pt at a future date/time as appropriate.     Linus Salmons PT, DPT 10/24/18, 4:05 PM

## 2018-10-24 NOTE — Plan of Care (Signed)
  Problem: Activity: Goal: Risk for activity intolerance will decrease Outcome: Progressing Note:  Up to [facility] with 1 assist, tolerating well   Problem: Nutrition: Goal: Adequate nutrition will be maintained Outcome: Progressing   Problem: Coping: Goal: Level of anxiety will decrease Outcome: Progressing Note:  Xanax given once at bedtime    Problem: Elimination: Goal: Will not experience complications related to urinary retention Outcome: Progressing   Problem: Pain Managment: Goal: General experience of comfort will improve Outcome: Progressing Note:  No complaints of pain this shift   Problem: Safety: Goal: Ability to remain free from injury will improve Outcome: Progressing

## 2018-10-25 ENCOUNTER — Inpatient Hospital Stay: Payer: Medicare HMO

## 2018-10-25 DIAGNOSIS — N170 Acute kidney failure with tubular necrosis: Secondary | ICD-10-CM

## 2018-10-25 LAB — FOLATE: Folate: 32 ng/mL (ref >5.9)

## 2018-10-25 LAB — BASIC METABOLIC PANEL
Anion gap: 10 (ref 5–15)
BUN: 64 mg/dL — ABNORMAL HIGH (ref 8–23)
CO2: 24 mmol/L (ref 22–32)
Calcium: 8.3 mg/dL — ABNORMAL LOW (ref 8.9–10.3)
Chloride: 93 mmol/L — ABNORMAL LOW (ref 98–111)
Creatinine, Ser: 2.42 mg/dL — ABNORMAL HIGH (ref 0.44–1.00)
GFR calc non Af Amer: 17 mL/min — ABNORMAL LOW (ref >60)
GFR, EST AFRICAN AMERICAN: 20 mL/min — AB (ref >60)
Glucose, Bld: 125 mg/dL — ABNORMAL HIGH (ref 70–99)
Potassium: 4.8 mmol/L (ref 3.5–5.1)
Sodium: 127 mmol/L — ABNORMAL LOW (ref 135–145)

## 2018-10-25 LAB — CBC WITH DIFFERENTIAL/PLATELET
ABS IMMATURE GRANULOCYTES: 0.14 10*3/uL — AB (ref 0.00–0.07)
Basophils Absolute: 0 10*3/uL (ref 0.0–0.1)
Basophils Relative: 0 %
EOS ABS: 0 10*3/uL (ref 0.0–0.5)
Eosinophils Relative: 0 %
HCT: 24.8 % — ABNORMAL LOW (ref 36.0–46.0)
Hemoglobin: 8.2 g/dL — ABNORMAL LOW (ref 12.0–15.0)
Immature Granulocytes: 1 %
LYMPHS ABS: 1.8 10*3/uL (ref 0.7–4.0)
Lymphocytes Relative: 12 %
MCH: 32.4 pg (ref 26.0–34.0)
MCHC: 33.1 g/dL (ref 30.0–36.0)
MCV: 98 fL (ref 80.0–100.0)
Monocytes Absolute: 1.1 10*3/uL — ABNORMAL HIGH (ref 0.1–1.0)
Monocytes Relative: 7 %
Neutro Abs: 12.5 10*3/uL — ABNORMAL HIGH (ref 1.7–7.7)
Neutrophils Relative %: 80 %
Platelets: 314 10*3/uL (ref 150–400)
RBC: 2.53 MIL/uL — ABNORMAL LOW (ref 3.87–5.11)
RDW: 13.5 % (ref 11.5–15.5)
WBC: 15.5 10*3/uL — ABNORMAL HIGH (ref 4.0–10.5)
nRBC: 0 % (ref 0.0–0.2)

## 2018-10-25 LAB — IRON AND TIBC
Iron: 15 ug/dL — ABNORMAL LOW (ref 28–170)
Saturation Ratios: 8 % — ABNORMAL LOW (ref 10.4–31.8)
TIBC: 183 ug/dL — ABNORMAL LOW (ref 250–450)
UIBC: 168 ug/dL

## 2018-10-25 LAB — RETICULOCYTES
Immature Retic Fract: 28.6 % — ABNORMAL HIGH (ref 2.3–15.9)
RBC.: 2.57 MIL/uL — ABNORMAL LOW (ref 3.87–5.11)
Retic Count, Absolute: 54.7 10*3/uL (ref 19.0–186.0)
Retic Ct Pct: 2.1 % (ref 0.4–3.1)

## 2018-10-25 LAB — PROCALCITONIN: Procalcitonin: 0.28 ng/mL

## 2018-10-25 LAB — CBC
HCT: 24.6 % — ABNORMAL LOW (ref 36.0–46.0)
Hemoglobin: 8.2 g/dL — ABNORMAL LOW (ref 12.0–15.0)
MCH: 32.4 pg (ref 26.0–34.0)
MCHC: 33.3 g/dL (ref 30.0–36.0)
MCV: 97.2 fL (ref 80.0–100.0)
Platelets: 304 10*3/uL (ref 150–400)
RBC: 2.53 MIL/uL — ABNORMAL LOW (ref 3.87–5.11)
RDW: 13.3 % (ref 11.5–15.5)
WBC: 15.7 10*3/uL — AB (ref 4.0–10.5)
nRBC: 0 % (ref 0.0–0.2)

## 2018-10-25 LAB — TSH: TSH: 3.888 u[IU]/mL (ref 0.350–4.500)

## 2018-10-25 LAB — PROTIME-INR
INR: 1 (ref 0.8–1.2)
Prothrombin Time: 13.4 seconds (ref 11.4–15.2)

## 2018-10-25 LAB — FERRITIN: Ferritin: 354 ng/mL — ABNORMAL HIGH (ref 11–307)

## 2018-10-25 LAB — APTT: aPTT: 43 seconds — ABNORMAL HIGH (ref 24–36)

## 2018-10-25 LAB — OSMOLALITY: Osmolality: 284 mOsm/kg (ref 275–295)

## 2018-10-25 LAB — VITAMIN B12: Vitamin B-12: 1951 pg/mL — ABNORMAL HIGH (ref 180–914)

## 2018-10-25 MED ORDER — SODIUM CHLORIDE 0.9 % IV SOLN
200.0000 mg | INTRAVENOUS | Status: AC
  Administered 2018-10-25 – 2018-10-26 (×2): 200 mg via INTRAVENOUS
  Filled 2018-10-25 (×3): qty 10

## 2018-10-25 MED ORDER — ENSURE ENLIVE PO LIQD
237.0000 mL | Freq: Two times a day (BID) | ORAL | Status: DC
  Administered 2018-10-26 (×2): 237 mL via ORAL

## 2018-10-25 MED ORDER — BOOST / RESOURCE BREEZE PO LIQD CUSTOM
1.0000 | Freq: Two times a day (BID) | ORAL | Status: DC
  Administered 2018-10-25: 237 mL via ORAL

## 2018-10-25 MED ORDER — POLYETHYLENE GLYCOL 3350 17 G PO PACK
17.0000 g | PACK | Freq: Every day | ORAL | Status: AC
  Administered 2018-10-25: 17 g via ORAL
  Filled 2018-10-25 (×2): qty 1

## 2018-10-25 MED ORDER — TRAMADOL HCL 50 MG PO TABS
50.0000 mg | ORAL_TABLET | Freq: Four times a day (QID) | ORAL | Status: DC | PRN
  Administered 2018-10-26: 50 mg via ORAL
  Filled 2018-10-25: qty 1

## 2018-10-25 MED ORDER — METHYLPREDNISOLONE SODIUM SUCC 125 MG IJ SOLR
60.0000 mg | INTRAMUSCULAR | Status: DC
  Administered 2018-10-25: 60 mg via INTRAVENOUS
  Filled 2018-10-25: qty 2

## 2018-10-25 MED ORDER — SODIUM CHLORIDE 0.9 % IV SOLN
INTRAVENOUS | Status: DC
  Administered 2018-10-25 – 2018-10-26 (×2): via INTRAVENOUS

## 2018-10-25 NOTE — Progress Notes (Addendum)
error 

## 2018-10-25 NOTE — Progress Notes (Signed)
Progress Note  Patient Name: [name] Date of Encounter: 10/25/2018  Primary Cardiologist:  [facility]  Subjective   Family at the bedside Patient reports she is feeling better, still with significant bronchospastic cough Slight improvement in her sputum Tired  Long discussion with family concerning recent hospital course Daughter from [place]  Inpatient Medications    Scheduled Meds: . allopurinol  200 mg Oral QHS  . aspirin EC  81 mg Oral Daily  . atorvastatin  40 mg Oral q1800  . carvedilol  3.125 mg Oral BID WC  . cholecalciferol  2,000 Units Oral Daily  . clopidogrel  75 mg Oral Q breakfast  . feeding supplement (ENSURE ENLIVE)  237 mL Oral BID BM  . heparin injection (subcutaneous)  5,000 Units Subcutaneous Q8H  . mouth rinse  15 mL Mouth Rinse BID  . methylPREDNISolone (SOLU-MEDROL) injection  60 mg Intravenous Q24H  . multivitamin with minerals  1 tablet Oral Daily  . nitroGLYCERIN  0.5 inch Topical Q6H  . olopatadine  1 drop Both Eyes BID  . pantoprazole  40 mg Oral Daily  . polyethylene glycol  17 g Oral Daily  . sodium chloride flush  3 mL Intravenous Q12H   Continuous Infusions: . sodium chloride    . sodium chloride 50 mL/hr at 10/25/18 1013  . cefTRIAXone (ROCEPHIN)  IV 2 g (10/25/18 1007)  . iron sucrose     PRN Meds: sodium chloride, acetaminophen, ALPRAZolam, nitroGLYCERIN, ondansetron (ZOFRAN) IV, sodium chloride flush, traMADol   Vital Signs    Vitals:   10/25/18 0019 10/25/18 0527 10/25/18 0528 10/25/18 0754  BP: (!) 101/39  (!) 108/43 (!) 112/47  Pulse: 70  70 72  Resp:   20 (!) 24  Temp:   98.3 F (36.8 C) (!) 97.3 F (36.3 C)  TempSrc:    Oral  SpO2: 100%  93% 96%  Weight:  74.8 kg    Height:        Intake/Output Summary (Last 24 hours) at 10/25/2018 1529 Last data filed at 10/25/2018 0900 Gross per 24 hour  Intake 120 ml  Output 300 ml  Net -180 ml   Last 3 Weights 10/25/2018 10/24/2018 10/23/2018  Weight  (lbs) 164 lb 14.4 oz 169 lb 3.2 oz 173 lb  Weight (kg) 74.798 kg 76.749 kg 78.472 kg      Telemetry    Normal sinus rhythm- Personally Reviewed  ECG     - Personally Reviewed  Physical Exam   GEN: No acute distress.  Significant cough Neck: No JVD Cardiac: RRR, no murmurs, rubs, or gallops.  Respiratory:  Poor airflow bilaterally, wheezing GI: Soft, nontender, non-distended  MS: No edema; No deformity. Neuro:  Nonfocal  Psych: Normal affect   Labs    Chemistry Recent Labs  Lab 10/27/2018 1710  10/23/18 0321 10/24/18 0510 10/25/18 0516  NA 126*   < > 130* 128* 127*  K 4.9  --  4.5 4.3 4.8  CL 95*  --  96* 94* 93*  CO2 20*  --  22 24 24   GLUCOSE 141*  --  136* 116* 125*  BUN 35*  --  36* 48* 64*  CREATININE 1.53*  --  1.59* 2.02* 2.42*  CALCIUM 8.8*  --  9.1 8.4* 8.3*  PROT 6.9  --   --   --   --   ALBUMIN 3.3*  --   --   --   --   AST 52*  --   --   --   --  ALT 42  --   --   --   --   ALKPHOS 86  --   --   --   --   BILITOT 0.7  --   --   --   --   GFRNONAA 30*  --  28* 21* 17*  GFRAA 35*  --  33* 25* 20*  ANIONGAP 11  --  12 10 10    < > = values in this interval not displayed.     Hematology Recent Labs  Lab 10/23/18 0321 10/24/18 0510 10/25/18 0516  WBC 17.0* 14.7* 15.5*  15.7*  RBC 2.60* 2.33* 2.53*  2.53*  2.57*  HGB 8.4* 7.4* 8.2*  8.2*  HCT 24.8* 22.4* 24.8*  24.6*  MCV 95.4 96.1 98.0  97.2  MCH 32.3 31.8 32.4  32.4  MCHC 33.9 33.0 33.1  33.3  RDW 13.5 13.4 13.5  13.3  PLT 287 274 314  304    Cardiac Enzymes Recent Labs  Lab 11/08/2018 2131 10/23/18 0321 10/23/18 0912 10/24/18 1143  TROPONINI 0.78* 0.93* 1.14* 0.66*   No results for input(s): TROPIPOC in the last 168 hours.   BNP Recent Labs  Lab 10/26/2018 1605  BNP 801.0*     DDimer No results for input(s): DDIMER in the last 168 hours.   Radiology    Dg Chest 2 View  Result Date: 10/25/2018 CLINICAL DATA:  Shortness of breath. EXAM: CHEST - 2 VIEW COMPARISON:   11/04/2018. FINDINGS: Cardiomegaly. Interim improvement of bilateral pulmonary infiltrates suggesting improving pulmonary edema. Bibasilar atelectasis. Bilateral pleural effusions, left side greater than right. No pneumothorax. IMPRESSION: Cardiomegaly. Interim improvement of bilateral pulmonary infiltrates suggesting improving pulmonary edema. Bibasilar atelectasis. Bilateral pleural effusions. Electronically Signed   By: Marcello Moores  Register   On: 10/25/2018 09:23    Cardiac Studies     Patient Profile     85 y.o. female with a history of HLD, HTN, carotid arterial disease s/p bilateral CEA, CVA, & CKD stage III admitted and being evaluated with elevated troponin  Assessment & Plan    A/P: Demand ischemia in the setting of severe bronchitis, hypoxia/respiratory distress and renal failure, tachycardia and hypertension on arrival High likelihood of underlying coronary disease given advanced peripheral arterial disease, carotid stenosis chest tightness likely secondary to underlying bronchitis, much improved  Normal ejection fraction -----In current state not a good candidate for ischemic work-up including stress testing or catheterization.  Severe renal dysfunction and unable to lay flat secondary to bronchitis cough ----Aspirin, beta-blocker, statin ----After bronchitis resolves would consider outpatient to discuss whether ischemic work-up needed and whether she would like to undergo further testing.  She has already indicated she prefers no catheterization  Acute bronchitis Symptoms starting December 2019 treated with amoxicillin or penicillin based antibiotic Symptoms resolved for a very short period of time then recurred and have not been treated  past 2 months -Chest x-ray concerning for patchy airspace disease, coughing up green sputum, clinical exam consistent with bronchitis -WBC 17, 14.7 --She is on broad-spectrum antibiotics --Consider steroids as poor air movement and wheezing   Pulmonary edema This admission was not secondary to CHF or pulmonary edema Was given Lasix on arrival out of concern for pulmonary edema, now with acute renal failure  Anemia Chronic anemia hemoglobin 8 up to 10 for the past year Outpatient work-up  PAD Bilateral carotid stenosis, had carotid endarterectomy Aspirin, Lipitor  Acute on chronic renal failure Worse after Lasix Nephrology following   Total encounter time more  than 25 minutes  Greater than 50% was spent in counseling and coordination of care with the patient    [facility] will sign off.   Medication Recommendations: No changes Other recommendations (labs, testing, etc): As outpatient consider ischemic work-up Follow up as an outpatient:  [facility]  For questions or updates, please contact [place] Please consult [email or web address] for contact info under        Signed, Ida Rogue, MD  10/25/2018, 3:29 PM

## 2018-10-25 NOTE — Plan of Care (Signed)
  Problem: Nutrition: Goal: Adequate nutrition will be maintained Outcome: Progressing   Problem: Elimination: Goal: Will not experience complications related to urinary retention Outcome: Progressing   Problem: Pain Managment: Goal: General experience of comfort will improve Outcome: Progressing Note:  No complaints of pain this shift   Problem: Safety: Goal: Ability to remain free from injury will improve Outcome: Progressing   Problem: Skin Integrity: Goal: Risk for impaired skin integrity will decrease Outcome: Progressing

## 2018-10-25 NOTE — Progress Notes (Addendum)
[place] [facility] [place] NAME: [name]    MR#:  [id number]  DATE OF BIRTH:  1927/06/01  SUBJECTIVE:  CHIEF COMPLAINT:   Chief Complaint  Patient presents with  . Shortness of Breath   -Renal function worsening today.  Off heparin drip.  Denies any chest pain, still has significant shortness of breath -Remains on 6 L oxygen which is all acute  REVIEW OF SYSTEMS:  Review of Systems  Constitutional: Positive for malaise/fatigue. Negative for chills and fever.  HENT: Negative for congestion, ear discharge, hearing loss and nosebleeds.   Eyes: Negative for blurred vision and double vision.  Respiratory: Positive for cough and shortness of breath. Negative for wheezing.   Cardiovascular: Positive for orthopnea and leg swelling. Negative for chest pain and palpitations.  Gastrointestinal: Negative for abdominal pain, constipation, diarrhea, nausea and vomiting.  Genitourinary: Negative for dysuria.  Musculoskeletal: Negative for myalgias.  Neurological: Negative for dizziness, focal weakness, seizures, weakness and headaches.  Psychiatric/Behavioral: Negative for depression.    DRUG ALLERGIES:   Allergies  Allergen Reactions  . Sulfa Antibiotics     VITALS:  Blood pressure (!) 112/47, pulse 72, temperature (!) 97.3 F (36.3 C), temperature source Oral, resp. rate (!) 24, height 5\' 2"  (1.575 m), weight 74.8 kg, SpO2 96 %.  PHYSICAL EXAMINATION:  Physical Exam   GENERAL:  85 y.o.-year-old elderly patient lying in the bed with no acute distress.  EYES: Pupils equal, round, reactive to light and accommodation. No scleral icterus. Extraocular muscles intact.  HEENT: Head atraumatic, normocephalic. Oropharynx and nasopharynx clear.  NECK:  Supple, no jugular venous distention. No thyroid enlargement, no tenderness.  LUNGS: coarse breath sounds bilaterally, has scattered rhonchi and wheezing.  rales at the bases, no crepitation. No use  of accessory muscles of respiration.  CARDIOVASCULAR: S1, S2 normal. No  rubs, or gallops. 3/6 systolic murmur present ABDOMEN: Soft, nontender, nondistended. Bowel sounds present. No organomegaly or mass.  EXTREMITIES: No pedal edema, cyanosis, or clubbing.  NEUROLOGIC: Cranial nerves II through XII are intact. Muscle strength 5/5 in all extremities. Sensation intact. Gait not checked. Global weakness noted. PSYCHIATRIC: The patient is alert and oriented x 3.  SKIN: No obvious rash, lesion, or ulcer.    LABORATORY PANEL:   CBC Recent Labs  Lab 10/25/18 0516  WBC 15.5*  15.7*  HGB 8.2*  8.2*  HCT 24.8*  24.6*  PLT 314  304   ------------------------------------------------------------------------------------------------------------------  Chemistries  Recent Labs  Lab 10/27/2018 1710  10/25/18 0516  NA 126*   < > 127*  K 4.9   < > 4.8  CL 95*   < > 93*  CO2 20*   < > 24  GLUCOSE 141*   < > 125*  BUN 35*   < > 64*  CREATININE 1.53*   < > 2.42*  CALCIUM 8.8*   < > 8.3*  AST 52*  --   --   ALT 42  --   --   ALKPHOS 86  --   --   BILITOT 0.7  --   --    < > = values in this interval not displayed.   ------------------------------------------------------------------------------------------------------------------  Cardiac Enzymes Recent Labs  Lab 10/24/18 1143  TROPONINI 0.66*   ------------------------------------------------------------------------------------------------------------------  RADIOLOGY:  Dg Chest 2 View  Result Date: 10/25/2018 CLINICAL DATA:  Shortness of breath. EXAM: CHEST - 2 VIEW COMPARISON:  10/20/2018. FINDINGS: Cardiomegaly. Interim improvement of bilateral pulmonary infiltrates suggesting  improving pulmonary edema. Bibasilar atelectasis. Bilateral pleural effusions, left side greater than right. No pneumothorax. IMPRESSION: Cardiomegaly. Interim improvement of bilateral pulmonary infiltrates suggesting improving pulmonary edema. Bibasilar  atelectasis. Bilateral pleural effusions. Electronically Signed   By: Marcello Moores  Register   On: 10/25/2018 09:23    EKG:   Orders placed or performed during the hospital encounter of 10/26/2018  . ED EKG  . ED EKG    ASSESSMENT AND PLAN:   85 year old female with PMH of CKD stage III, GERD, hypertension, history of stroke who lives with her daughter, came in for worsening shortness of breath.  1.  Acute new onset congestive heart failure-fluid restriction, daily weights -Strict input and output monitoring.  appreciate cardiology consult. -Echocardiogram with normal EF and diastolic CHF -Lasix on hold due to worsening renal function  2.  Community-acquired pneumonia- Negative blood cultures.  - WBC is elevated.  Still on 6 L oxygen though x-ray is improving. -X-ray with pulmonary edema on admission, may be underlying pneumonia. -Continue Rocephin and azithromycin -Patient will need a CT chest once her breathing is improved and she can lay flat -Some reactive airway disease with wheezing today.  Added steroids -Check procalcitonin level  3.  NSTEMI-could be demand ischemia. -Off heparin drip as troponins are plateaued. - Denies any active chest pain now.   -No plans for cardiac cath at this time -Continue aspirin, added beta-blocker and statin -Not a candidate for cardiac rehab given overall poor functioning  4.  Hyponatremia-secondary to underlying CHF.  -Lasix on hold.  Continue to monitor  5.  Acute renal failure on CKD stage III-IV: Nephrology has been consulted.   -Creatinine was 1.5 on admission, worsened up to 2.4 today.  Lasix has been held 2 days ago. -Gentle hydration today and closely monitor her sodium and respiratory status.-No prior outpatient follow-up. - Avoid nephrotoxins -Continue to monitor at this time  6.  DVT prophylaxis-on subcutaneous heparin  7.  Anemia of chronic disease-hemoglobin at 8.2.  Monitor closely. -Iron studies ordered, showing significantly  low iron levels.  Ferritin is elevated as it is an acute phase reactant. -IV iron for 2 doses -No active indication for transfusion at this time.  Continue to monitor   Physical therapy   She ambulates with a walker at baseline.   Palliative care consulted-we will need home health and palliative care at discharge  Please update daughter [name] who is healthcare power of attorney daily Updated today    All the records are reviewed and case discussed with Care Management/Social Workerr. Management plans discussed with the patient, family and they are in agreement.  CODE STATUS: DNR  TOTAL TIME TAKING CARE OF THIS PATIENT: 38 minutes.   POSSIBLE D/C IN 3 DAYS, DEPENDING ON CLINICAL CONDITION.   Gladstone Lighter M.D on 10/25/2018 at 1:11 PM  Between 7am to 6pm - Pager - [phone]  After 6pm go to [email or web address] - password [email or web address] [facility]  Office  [phone]  CC: Primary care physician; Lavera Guise, MD

## 2018-10-25 NOTE — Care Management Important Message (Signed)
Important Message  Patient Details  Name: [name] MRN: [id number] Date of Birth: 1927-08-01   Medicare Important Message Given:  Yes    Dannette Barbara 10/25/2018, 10:45 AM

## 2018-10-25 NOTE — Plan of Care (Signed)
  Problem: Education: Goal: Knowledge of General Education information will improve Description Including pain rating scale, medication(s)/side effects and non-pharmacologic comfort measures Outcome: Progressing Note:  Discussed difference between CHF and bronchitis   Problem: Health Behavior/Discharge Planning: Goal: Ability to manage health-related needs will improve Outcome: Progressing   Problem: Clinical Measurements: Goal: Ability to maintain clinical measurements within normal limits will improve Outcome: Progressing Goal: Will remain free from infection Outcome: Progressing Goal: Diagnostic test results will improve Outcome: Progressing Goal: Respiratory complications will improve Outcome: Progressing Goal: Cardiovascular complication will be avoided Outcome: Progressing   Problem: Activity: Goal: Risk for activity intolerance will decrease Outcome: Progressing   Problem: Nutrition: Goal: Adequate nutrition will be maintained Outcome: Progressing   Problem: Coping: Goal: Level of anxiety will decrease Outcome: Progressing   Problem: Elimination: Goal: Will not experience complications related to bowel motility Outcome: Progressing Goal: Will not experience complications related to urinary retention Outcome: Progressing   Problem: Pain Managment: Goal: General experience of comfort will improve Outcome: Progressing   Problem: Safety: Goal: Ability to remain free from injury will improve Outcome: Progressing   Problem: Skin Integrity: Goal: Risk for impaired skin integrity will decrease Outcome: Progressing   Problem: Education: Goal: Ability to demonstrate management of disease process will improve Outcome: Progressing Goal: Ability to verbalize understanding of medication therapies will improve Outcome: Progressing Goal: Individualized Educational Video(s) Outcome: Progressing   Problem: Activity: Goal: Capacity to carry out activities will  improve Outcome: Progressing   Problem: Cardiac: Goal: Ability to achieve and maintain adequate cardiopulmonary perfusion will improve Outcome: Progressing

## 2018-10-25 NOTE — Progress Notes (Signed)
Initial Nutrition Assessment  DOCUMENTATION CODES:   Not applicable  INTERVENTION:   Ensure Enlive po BID, each supplement provides 350 kcal and 20 grams of protein  MVI daily   Dysphagia 3 diet   NUTRITION DIAGNOSIS:   Inadequate oral intake related to acute illness as evidenced by meal completion < 25%.  GOAL:   Patient will meet greater than or equal to 90% of their needs  MONITOR:   PO intake, Supplement acceptance, Labs, Weight trends, Skin, I & O's  REASON FOR ASSESSMENT:   Consult Assessment of nutrition requirement/status, Diet education  ASSESSMENT:   85 year old female with PMH of CKD stage III, GERD, HTN, hx of stroke, who presented to ED with SOB.  Patient found to have acute new onset of CHF (echo pending), CAP, NSTEMI, hyponatremia - secondary to underlying CHF, CKD Stage III-IV.     Met with pt and family in room today. Pt is weak today and unable to provided much nutrition related history. Pt does report that her appetite has been poor for several days. Pt eating only bites of meals today. Pt did eat some yogurt for breakfast and a few bites of a breakfast burrito. Pt is willing to drink vanilla Ensure. Pt requesting mechanical soft diet as she reports weakness and difficulty chewing. Per chart, pt is weight stable pta. RD discussed briefly about low sodium diet and quality of life. Pt not appropriate for full diet education at this time.   Medications reviewed and include: allopurinol, aspirin, vitamin D, plavix, heparin, solu-medrol, MVI, protonix, miralax, ceftriaxone, iron sucrose   Labs reviewed: Na 127(L), Cl 93(L), BUN 64(H), creat 2.42(H) Iron 15(L), TIBC 183(L), ferritin 354(H) Wbc- 15.5(H), Hgb 8.2(L), Hct 24.8(L)  NUTRITION - FOCUSED PHYSICAL EXAM:    Most Recent Value  Orbital Region  No depletion  Upper Arm Region  No depletion  Thoracic and Lumbar Region  No depletion  Buccal Region  No depletion  Temple Region  No depletion  Clavicle  Bone Region  No depletion  Clavicle and Acromion Bone Region  No depletion  Scapular Bone Region  No depletion  Dorsal Hand  No depletion  Patellar Region  No depletion  Anterior Thigh Region  No depletion  Posterior Calf Region  No depletion  Edema (RD Assessment)  Mild  Hair  Reviewed  Eyes  Reviewed  Mouth  Reviewed  Skin  Reviewed  Nails  Reviewed     Diet Order:   Diet Order            Diet Heart Room service appropriate? Yes; Fluid consistency: Thin  Diet effective now             EDUCATION NEEDS:   Education needs have been addressed  Skin:  Skin Assessment: Reviewed RN Assessment(ecchymosis, incision )  Last BM:  3/9  Height:   Ht Readings from Last 1 Encounters:  11/09/2018 5' 2" (1.575 m)    Weight:   Wt Readings from Last 1 Encounters:  10/25/18 74.8 kg    Ideal Body Weight:  50 kg  BMI:  Body mass index is 30.16 kg/m.  Estimated Nutritional Needs:   Kcal:  1400-1600kcal/day   Protein:  75-83g/day   Fluid:  1.3L/day   Koleen Distance MS, RD, LDN Pager #- [phone] Office#- [phone] After Hours Pager: [phone]

## 2018-10-25 NOTE — Progress Notes (Signed)
[facility], [place] 10/25/18  Subjective:   Continues to have coughing Na is lower at 127 Creatinine is worse Patient states appetite is poor  Objective:  Vital signs in last 24 hours:  Temp:  [97.3 F (36.3 C)-98.3 F (36.8 C)] 97.3 F (36.3 C) (03/13 0754) Pulse Rate:  [62-72] 72 (03/13 0754) Resp:  [16-24] 24 (03/13 0754) BP: (100-112)/(39-49) 112/47 (03/13 0754) SpO2:  [93 %-100 %] 96 % (03/13 0754) Weight:  [74.8 kg] 74.8 kg (03/13 0527)  Weight change: -1.95 kg Filed Weights   10/23/18 0324 10/24/18 0344 10/25/18 0527  Weight: 78.5 kg 76.7 kg 74.8 kg    Intake/Output:    Intake/Output Summary (Last 24 hours) at 10/25/2018 1043 Last data filed at 10/25/2018 0737 Gross per 24 hour  Intake 360 ml  Output 300 ml  Net 60 ml    Physical Exam: General:  Frail, elderly woman, laying in the bed  HEENT  anicteric, moist oral mucous membranes  Neck:  Supple, no masses  Lungs:  Normal breathing effort, mild basilar crackles  Heart::  No rub  Abdomen:  Soft, nontender  Extremities:  No peripheral edema, SCDs in place  Neurologic:  Alert, able to answer questions appropriately  Skin:  No acute rashes     Basic Metabolic Panel:  Recent Labs  Lab 11/10/2018 1710 10/15/2018 2131 10/23/18 0321 10/24/18 0510 10/25/18 0516  NA 126* 128* 130* 128* 127*  K 4.9  --  4.5 4.3 4.8  CL 95*  --  96* 94* 93*  CO2 20*  --  22 24 24   GLUCOSE 141*  --  136* 116* 125*  BUN 35*  --  36* 48* 64*  CREATININE 1.53*  --  1.59* 2.02* 2.42*  CALCIUM 8.8*  --  9.1 8.4* 8.3*     CBC: Recent Labs  Lab 10/15/2018 1605 10/23/18 0321 10/24/18 0510 10/25/18 0516  WBC 13.0* 17.0* 14.7* 15.5*  15.7*  NEUTROABS  --   --   --  12.5*  HGB 9.1* 8.4* 7.4* 8.2*  8.2*  HCT 26.7* 24.8* 22.4* 24.8*  24.6*  MCV 96.0 95.4 96.1 98.0  97.2  PLT 329 287 274 314  304     No results found for: HEPBSAG, HEPBSAB, HEPBIGM    Microbiology:  Recent Results (from  the past 240 hour(s))  CULTURE, BLOOD (ROUTINE X 2) w Reflex to ID Panel     Status: None (Preliminary result)   Collection Time: 10/23/18  1:07 PM  Result Value Ref Range Status   Specimen Description   Final    BLOOD BLOOD RIGHT WRIST Performed at [facility], [address], [place], [place] [address]    Special Requests   Final    BOTTLES DRAWN AEROBIC AND ANAEROBIC Blood Culture adequate volume Performed at [facility], [address] [place], [place] [address]    Culture  Setup Time   Final    Organism ID to follow [place] TO, READ BACK BY AND VERIFIED WITH: C/T KAREN HAYES AT 6270 10/24/18 [facility] Performed at [facility], [address], [place], [place] [address]    Culture   Final    Lonell Grandchild POSITIVE COCCI CULTURE REINCUBATED FOR BETTER GROWTH Performed at [facility], [address], [place], [place] [address]    Report Status PENDING  Incomplete  Blood Culture ID Panel (Reflexed)     Status: Abnormal  Collection Time: 10/23/18  1:07 PM  Result Value Ref Range Status   Enterococcus species NOT DETECTED NOT DETECTED Final   Listeria monocytogenes NOT DETECTED NOT DETECTED Final   Staphylococcus species NOT DETECTED NOT DETECTED Final   Staphylococcus aureus (BCID) NOT DETECTED NOT DETECTED Final   Streptococcus species DETECTED (A) NOT DETECTED Final    Comment: Not Enterococcus species, Streptococcus agalactiae, Streptococcus pyogenes, or Streptococcus pneumoniae. CRITICAL RESULT CALLED TO, READ BACK BY AND VERIFIED WITH: C/T KAREN HAYES AT 5366 10/24/18 [facility]    Streptococcus agalactiae NOT DETECTED NOT DETECTED Final   Streptococcus pneumoniae NOT DETECTED NOT DETECTED Final   Streptococcus pyogenes NOT DETECTED NOT DETECTED Final   Acinetobacter baumannii NOT DETECTED NOT DETECTED Final   Enterobacteriaceae species NOT DETECTED NOT DETECTED Final   Enterobacter cloacae  complex NOT DETECTED NOT DETECTED Final   Escherichia coli NOT DETECTED NOT DETECTED Final   Klebsiella oxytoca NOT DETECTED NOT DETECTED Final   Klebsiella pneumoniae NOT DETECTED NOT DETECTED Final   Proteus species NOT DETECTED NOT DETECTED Final   Serratia marcescens NOT DETECTED NOT DETECTED Final   Haemophilus influenzae NOT DETECTED NOT DETECTED Final   Neisseria meningitidis NOT DETECTED NOT DETECTED Final   Pseudomonas aeruginosa NOT DETECTED NOT DETECTED Final   Candida albicans NOT DETECTED NOT DETECTED Final   Candida glabrata NOT DETECTED NOT DETECTED Final   Candida krusei NOT DETECTED NOT DETECTED Final   Candida parapsilosis NOT DETECTED NOT DETECTED Final   Candida tropicalis NOT DETECTED NOT DETECTED Final    Comment: Performed at [facility], [address] [place], [place] [address]  CULTURE, BLOOD (ROUTINE X 2) w Reflex to ID Panel     Status: None (Preliminary result)   Collection Time: 10/23/18  1:16 PM  Result Value Ref Range Status   Specimen Description BLOOD BLOOD LEFT WRIST  Final   Special Requests   Final    BOTTLES DRAWN AEROBIC AND ANAEROBIC Blood Culture adequate volume   Culture   Final    NO GROWTH 2 DAYS Performed at [facility], [address] [place], [place] [address]    Report Status PENDING  Incomplete  Expectorated sputum assessment w rflx to resp cult     Status: None   Collection Time: 10/23/18  9:31 PM  Result Value Ref Range Status   Specimen Description EXPECTORATED SPUTUM  Final   Special Requests Normal  Final   Sputum evaluation   Final    THIS SPECIMEN IS ACCEPTABLE FOR SPUTUM CULTURE Performed at [facility], [address] [place], [place] [address]    Report Status 10/23/2018 FINAL  Final  Culture, respiratory     Status: None (Preliminary result)   Collection Time: 10/23/18  9:31 PM  Result Value Ref Range Status   Specimen Description   Final    EXPECTORATED SPUTUM Performed at  [facility], [address], [place], [place] [address]    Special Requests   Final    Normal Reflexed from [phone] Performed at [facility], [place], [place] [address]    Gram Stain   Final    RARE WBC PRESENT, PREDOMINANTLY PMN RARE SQUAMOUS EPITHELIAL CELLS PRESENT MODERATE GRAM POSITIVE COCCI RARE GRAM POSITIVE RODS Performed at [facility], [address], [place], [place] [address]    Culture PENDING  Incomplete   Report Status PENDING  Incomplete    Coagulation Studies: Recent Labs    10/23/18  0321 10/23/18 1640 10/24/18 0510 10/24/18 1650 10/25/18 0516  LABPROT 14.3 14.4 14.7 12.4 13.4  INR 1.1 1.1 1.2 0.9 1.0    Urinalysis: No results for input(s): COLORURINE, LABSPEC, PHURINE, GLUCOSEU, HGBUR, BILIRUBINUR, KETONESUR, PROTEINUR, UROBILINOGEN, NITRITE, LEUKOCYTESUR in the last 72 hours.  Invalid input(s): APPERANCEUR    Imaging: Dg Chest 2 View  Result Date: 10/25/2018 CLINICAL DATA:  Shortness of breath. EXAM: CHEST - 2 VIEW COMPARISON:  11/12/2018. FINDINGS: Cardiomegaly. Interim improvement of bilateral pulmonary infiltrates suggesting improving pulmonary edema. Bibasilar atelectasis. Bilateral pleural effusions, left side greater than right. No pneumothorax. IMPRESSION: Cardiomegaly. Interim improvement of bilateral pulmonary infiltrates suggesting improving pulmonary edema. Bibasilar atelectasis. Bilateral pleural effusions. Electronically Signed   By: Marcello Moores  Register   On: 10/25/2018 09:23     Medications:   . sodium chloride    . sodium chloride 50 mL/hr at 10/25/18 1013  . cefTRIAXone (ROCEPHIN)  IV 2 g (10/25/18 1007)   . allopurinol  200 mg Oral QHS  . aspirin EC  81 mg Oral Daily  . atorvastatin  40 mg Oral q1800  . carvedilol  3.125 mg Oral BID WC  . cholecalciferol  2,000 Units Oral Daily  . clopidogrel  75 mg Oral Q breakfast  . feeding supplement  1 Container Oral BID BM  . heparin injection  (subcutaneous)  5,000 Units Subcutaneous Q8H  . mouth rinse  15 mL Mouth Rinse BID  . multivitamin with minerals  1 tablet Oral Daily  . nitroGLYCERIN  0.5 inch Topical Q6H  . olopatadine  1 drop Both Eyes BID  . pantoprazole  40 mg Oral Daily  . sodium chloride flush  3 mL Intravenous Q12H   sodium chloride, acetaminophen, ALPRAZolam, nitroGLYCERIN, ondansetron (ZOFRAN) IV, sodium chloride flush  Assessment/ Plan:  85 y.o.caucasian female with hypertension, chronic kidney disease, history of stroke, carotid endarterectomy, was admitted on 10/24/2018 with shortness of breath and non-STEMI.   1.  Hyponatremia 2.  Acute kidney injury 3.  Chronic kidney disease stage III 4.  Proteinuria 5.  Hypertension with CKD 6.  Acute pulmonary edema 7.  Non-STEMI 8.  Pneumonia, community acquired  Patient appears to have chronic kidney disease with baseline creatinine of 1.53/GFR 38 at the time of presentation.  Sodium was low at 126.  With IV diuresis, sodium has improved to 130.  Serum creatinine is higher which may be related to aggressive diuresis  Currently getting low dose NS. Encourage oral calorie intake.  Urine studies We will follow   LOS: [place] 3/13/202010:43 AM  [place], [place]  Note: This note was prepared with Dragon dictation. Any transcription errors are unintentional

## 2018-10-25 NOTE — Evaluation (Signed)
Physical Therapy Evaluation Patient Details Name: [name] MRN: [id number] DOB: 1927-07-10 Today's Date: 10/25/2018   History of Present Illness  From cardiologist progress note: Pt is an 85 y.o. female with a history of HLD, HTN, carotid arterial disease s/p bilateral CEA, CVA, & CKD stage III admitted and being evaluated with elevated troponin.  Assessment includes: Demand ischemia, severe acute bronchitis, hypoxia/respiratory distress, renal failure, hypertension, anemia, PAD with bilateral carotid stenosis, and pulmonary edema.     Clinical Impression  Pt presents with deficits in strength, transfers, mobility, gait, balance, and activity tolerance.  Pt was lethargic at onset of session requiring significant time to wake up enough to participate with session.  Pt required extensive assistance with bed mobility tasks stating "I'm just so weak today".  Pt required min A with sit to/from stand transfers and once in standing was only able to take several very small shuffling steps with Min A for stability before requiring to return to sitting.  Pt's SpO2 and HR WNL on 6LO2/min.  Per chart review pt lives with her daughter with 3 stairs to enter the home and is ambulatory with a walker at baseline.  Based on the pt's current deficits in functional mobility she would not be safe to return to her prior living situation at this time.  Pt will benefit from PT services in a SNF setting upon discharge to safely address above deficits for decreased caregiver assistance and eventual return to PLOF.      Follow Up Recommendations SNF;Supervision for mobility/OOB    Equipment Recommendations  None recommended by PT    Recommendations for Other Services       Precautions / Restrictions Precautions Precautions: Fall Restrictions Weight Bearing Restrictions: No      Mobility  Bed Mobility Overal bed mobility: Needs Assistance Bed Mobility: Supine to Sit;Sit to Supine     Supine to sit:  Max assist Sit to supine: Max assist   General bed mobility comments: Max A required for BLE and trunk control  Transfers Overall transfer level: Needs assistance Equipment used: Rolling walker (2 wheeled) Transfers: Sit to/from Stand Sit to Stand: From elevated surface;Min assist         General transfer comment: Min A and verbal cues for sequencing required during sit to/from stand transfers   Ambulation/Gait Ambulation/Gait assistance: Min assist Gait Distance (Feet): 1 Feet Assistive device: Rolling walker (2 wheeled) Gait Pattern/deviations: Step-to pattern;Trunk flexed;Shuffle Gait velocity: decreased   General Gait Details: Pt only able to take 1-2 very small shuffle steps at the EOB with heavy lean on the RW before requiring to return to sitting, SpO2 and HR WNL on 6LO2/min  Stairs            Wheelchair Mobility    Modified Rankin (Stroke Patients Only)       Balance Overall balance assessment: Needs assistance   Sitting balance-Leahy Scale: Fair     Standing balance support: Bilateral upper extremity supported Standing balance-Leahy Scale: Poor Standing balance comment: Min A for stability and heavy lean on the RW in standing                             Pertinent Vitals/Pain Pain Assessment: No/denies pain    Home Living Family/patient expects to be discharged to:: Private residence(Pt unable to provide history, unable to reach daughter (POA) by phone; below history from chart review and previous admission from Aug, 2019) Living Arrangements: Children  Available Help at Discharge: Family;Personal care attendant Type of Home: House Home Access: Stairs to enter Entrance Stairs-Rails: Chemical engineer of Steps: 3 Home Layout: One level Home Equipment: Walker - 2 wheels;Bedside commode;Cane - quad;Cane - single point      Prior Function Level of Independence: Needs assistance   Gait / Transfers Assistance Needed:  Patient ambulates household distances with RW   ADL's / Homemaking Assistance Needed: 1x a week an aide assists with bathing, patient able to dress independently, family does cooking/cleaning, patient does attend church every week, weather dependent.         Hand Dominance   Dominant Hand: Right    Extremity/Trunk Assessment   Upper Extremity Assessment Upper Extremity Assessment: Generalized weakness    Lower Extremity Assessment Lower Extremity Assessment: Generalized weakness       Communication   Communication: No difficulties  Cognition Arousal/Alertness: Lethargic Behavior During Therapy: Flat affect Overall Cognitive Status: No family/caregiver present to determine baseline cognitive functioning                                 General Comments: Pt able to follow simple commands with extra time and cuing required on occasion      General Comments      Exercises Total Joint Exercises Ankle Circles/Pumps: AROM;Strengthening;Both;10 reps Quad Sets: Strengthening;Both;10 reps Gluteal Sets: Strengthening;Both;10 reps Short Arc Quad: Strengthening;Both;10 reps Heel Slides: Both;10 reps;AAROM Hip ABduction/ADduction: AAROM;Both;10 reps Straight Leg Raises: AAROM;Both;10 reps Long Arc Quad: AROM;Both;10 reps Knee Flexion: AROM;10 reps Marching in Standing: AROM;Both;5 reps;Seated   Assessment/Plan    PT Assessment Patient needs continued PT services  PT Problem List Decreased strength;Decreased activity tolerance;Decreased balance;Decreased mobility;Decreased knowledge of use of DME       PT Treatment Interventions DME instruction;Gait training;Stair training;Functional mobility training;Therapeutic activities;Therapeutic exercise;Balance training;Patient/family education    PT Goals (Current goals can be found in the Care Plan section)  Acute Rehab PT Goals PT Goal Formulation: Patient unable to participate in goal setting Time For Goal  Achievement: 11/07/18 Potential to Achieve Goals: Fair    Frequency Min 2X/week   Barriers to discharge Inaccessible home environment      Co-evaluation               AM-PAC PT "6 Clicks" Mobility  Outcome Measure Help needed turning from your back to your side while in a flat bed without using bedrails?: A Lot Help needed moving from lying on your back to sitting on the side of a flat bed without using bedrails?: A Lot Help needed moving to and from a bed to a chair (including a wheelchair)?: A Lot Help needed standing up from a chair using your arms (e.g., wheelchair or bedside chair)?: A Little Help needed to walk in hospital room?: Total Help needed climbing 3-5 steps with a railing? : Total 6 Click Score: 11    End of Session Equipment Utilized During Treatment: Gait belt;Oxygen Activity Tolerance: Patient tolerated treatment well Patient left: in bed;with call bell/phone within reach;with bed alarm set;with SCD's reapplied Nurse Communication: Mobility status PT Visit Diagnosis: Unsteadiness on feet (R26.81);Muscle weakness (generalized) (M62.81);Difficulty in walking, not elsewhere classified (R26.2)    Time: 2119-4174 PT Time Calculation (min) (ACUTE ONLY): 29 min   Charges:   PT Evaluation $PT Eval Low Complexity: 1 Low PT Treatments $Therapeutic Exercise: 8-22 mins        D. Royetta Asal PT, DPT 10/25/18,  4:05 PM

## 2018-10-26 ENCOUNTER — Encounter: Payer: Self-pay | Admitting: Pulmonary Disease

## 2018-10-26 LAB — CBC
HCT: 25 % — ABNORMAL LOW (ref 36.0–46.0)
Hemoglobin: 8.2 g/dL — ABNORMAL LOW (ref 12.0–15.0)
MCH: 32.7 pg (ref 26.0–34.0)
MCHC: 32.8 g/dL (ref 30.0–36.0)
MCV: 99.6 fL (ref 80.0–100.0)
Platelets: 340 10*3/uL (ref 150–400)
RBC: 2.51 MIL/uL — ABNORMAL LOW (ref 3.87–5.11)
RDW: 13.3 % (ref 11.5–15.5)
WBC: 12.7 10*3/uL — ABNORMAL HIGH (ref 4.0–10.5)
nRBC: 0.2 % (ref 0.0–0.2)

## 2018-10-26 LAB — BASIC METABOLIC PANEL
Anion gap: 12 (ref 5–15)
BUN: 74 mg/dL — ABNORMAL HIGH (ref 8–23)
CO2: 18 mmol/L — ABNORMAL LOW (ref 22–32)
Calcium: 7.9 mg/dL — ABNORMAL LOW (ref 8.9–10.3)
Chloride: 91 mmol/L — ABNORMAL LOW (ref 98–111)
Creatinine, Ser: 2.99 mg/dL — ABNORMAL HIGH (ref 0.44–1.00)
GFR calc Af Amer: 15 mL/min — ABNORMAL LOW (ref >60)
GFR calc non Af Amer: 13 mL/min — ABNORMAL LOW (ref >60)
Glucose, Bld: 166 mg/dL — ABNORMAL HIGH (ref 70–99)
Potassium: 5.5 mmol/L — ABNORMAL HIGH (ref 3.5–5.1)
SODIUM: 121 mmol/L — AB (ref 135–145)

## 2018-10-26 LAB — CULTURE, RESPIRATORY W GRAM STAIN: Culture: NORMAL

## 2018-10-26 LAB — CULTURE, RESPIRATORY: SPECIAL REQUESTS: NORMAL

## 2018-10-26 LAB — CULTURE, BLOOD (ROUTINE X 2): Special Requests: ADEQUATE

## 2018-10-26 MED ORDER — ALBUTEROL SULFATE (2.5 MG/3ML) 0.083% IN NEBU: 2.5000 mg | INHALATION_SOLUTION | Freq: Four times a day (QID) | RESPIRATORY_TRACT | Status: DC

## 2018-10-26 MED ORDER — SODIUM CHLORIDE 0.9 % IV SOLN
100.0000 mg | Freq: Two times a day (BID) | INTRAVENOUS | Status: DC
  Administered 2018-10-26: 100 mg via INTRAVENOUS
  Filled 2018-10-26 (×3): qty 100

## 2018-10-26 MED ORDER — SODIUM POLYSTYRENE SULFONATE 15 GM/60ML PO SUSP
15.0000 g | Freq: Every day | ORAL | Status: DC
  Administered 2018-10-26: 15 g via ORAL
  Filled 2018-10-26: qty 60

## 2018-10-26 MED ORDER — MORPHINE SULFATE (PF) 2 MG/ML IV SOLN: 2.0000 mg | INTRAVENOUS | Status: DC | PRN

## 2018-10-26 MED ORDER — BISACODYL 5 MG PO TBEC: 5.0000 mg | DELAYED_RELEASE_TABLET | Freq: Every day | ORAL | Status: DC | PRN

## 2018-10-26 MED ORDER — FUROSEMIDE 10 MG/ML IJ SOLN
6.0000 mg/h | INTRAVENOUS | Status: DC
  Administered 2018-10-26: 6 mg/h via INTRAVENOUS
  Filled 2018-10-26: qty 25

## 2018-10-26 MED ORDER — ALBUMIN HUMAN 25 % IV SOLN
12.5000 g | Freq: Once | INTRAVENOUS | Status: AC
  Administered 2018-10-26: 12.5 g via INTRAVENOUS
  Filled 2018-10-26: qty 50

## 2018-10-26 NOTE — Plan of Care (Signed)
  Problem: Activity: Goal: Risk for activity intolerance will decrease Outcome: Progressing Note:  Up to [facility] with 1 assist   Problem: Coping: Goal: Level of anxiety will decrease Outcome: Progressing Note:  Pt very anxious this shift, xanax given once at bedtime   Problem: Elimination: Goal: Will not experience complications related to urinary retention Outcome: Progressing   Problem: Pain Managment: Goal: General experience of comfort will improve Outcome: Progressing Note:  No complaints of pain this shift   Problem: Skin Integrity: Goal: Risk for impaired skin integrity will decrease Outcome: Progressing

## 2018-10-26 NOTE — Progress Notes (Signed)
[name] [place] NAME: [name]    MR#:  [id number]  DATE OF BIRTH:  [birth year]  SUBJECTIVE:  CHIEF COMPLAINT:   Chief Complaint  Patient presents with  . Shortness of Breath   At this time patient's family on board with the plan to see if she qualifies for home with hospice. Will see if she is appropriate and transition to comfort measures only if appropriate. Patient's daughter present in the room. Provided support. Patient  "tired. I want to rest". Poor appetite. No energy. Diffuse weakness.  Discontinued nitropaste. Discontinued respiratory treatments at family request.  REVIEW OF SYSTEMS:  Review of Systems  Constitutional: Positive for malaise/fatigue. Negative for chills and fever.  HENT: Negative for congestion, ear discharge, hearing loss and nosebleeds.   Eyes: Negative for blurred vision and double vision.  Respiratory: Positive for cough and shortness of breath. Negative for wheezing.   Cardiovascular: Positive for orthopnea and leg swelling. Negative for chest pain and palpitations.  Gastrointestinal: Negative for abdominal pain, constipation, diarrhea, nausea and vomiting.  Genitourinary: Negative for dysuria.  Musculoskeletal: Negative for myalgias.  Neurological: Positive for weakness. Negative for dizziness, focal weakness, seizures, and headaches.  Psychiatric/Behavioral: Negative for depression.  DRUG ALLERGIES:   Allergies  Allergen Reactions  . Sulfa Antibiotics    VITALS:  Blood pressure (!) 111/43, pulse 69, temperature (!) 97.5 F (36.4 C), temperature source Oral, resp. rate 20, height 5\' 2"  (1.575 m), weight 74.9 kg, SpO2 96 %. PHYSICAL EXAMINATION:  GENERAL:  85 y.o.-year-old elderly patient lying in the bed with no acute distress. Appears ill, lethargic.  EYES: Pupils equal, round, reactive to light and accommodation. No scleral icterus. Extraocular muscles intact.  HEENT: Head atraumatic,  normocephalic. Oropharynx and nasopharynx clear.  NECK:  Supple, no jugular venous distention. No thyroid enlargement, no tenderness.  LUNGS: coarse breath sounds bilaterally, has scattered rhonchi and wheezing.  rales at the bases, no crepitation. No use of accessory muscles of respiration.  CARDIOVASCULAR: S1, S2 normal. No  rubs, or gallops. 3/6 systolic murmur present. ABDOMEN: Soft, nontender, nondistended. Bowel sounds present. No organomegaly or mass.  EXTREMITIES: + pedal edema, no cyanosis, or clubbing.  NEUROLOGIC: Cranial nerves II through XII are intact. Sensation intact. Gait not checked. Global weakness noted. PSYCHIATRIC: The patient is alert and oriented x 3.  SKIN: Diffuse ecchymoses upper extremities.  LABORATORY PANEL:  Female CBC Recent Labs  Lab 10/26/18 0539  WBC 12.7*  HGB 8.2*  HCT 25.0*  PLT 340   ------------------------------------------------------------------------------------------------------------------ Chemistries  Recent Labs  Lab 10/13/2018 1710  10/26/18 0539  NA 126*   < > 121*  K 4.9   < > 5.5*  CL 95*   < > 91*  CO2 20*   < > 18*  GLUCOSE 141*   < > 166*  BUN 35*   < > 74*  CREATININE 1.53*   < > 2.99*  CALCIUM 8.8*   < > 7.9*  AST 52*  --   --   ALT 42  --   --   ALKPHOS 86  --   --   BILITOT 0.7  --   --    < > = values in this interval not displayed.   RADIOLOGY:  No results found. ASSESSMENT AND PLAN:  85 year old female with PMH of CKD stage III, GERD, hypertension, history of stroke who lives with her daughter, came in for worsening shortness of breath.  Today feels  worse, Na lower, Creatinine worsening, potassium elevated. Nephrology following who feels conversations about goals of care are indicated. Plan for home hospice if she qualifies, CM Josh following. Will transition to comfort measures only if this is applicable.   1.  Acute new onset congestive heart failure-fluid restriction, daily weights -Strict input and output  monitoring.  appreciate cardiology consult. -Echocardiogram with normal EF and diastolic CHF -Lasix on hold due to worsening renal function  2.  Community-acquired pneumonia- Negative blood cultures.  - WBC is elevated.  Still on 5 L oxygen though x-ray is improving. -X-ray with pulmonary edema on admission, may be underlying pneumonia. - seen by Pulmonology: ordered respiratory treatments, PT/OT daily albumin, changed to vibramycin for better gram + coverage pending sensitivity panel -Was on Rocephin and azithromycin, discontinued -Patient will need a CT chest once her breathing is improved and she can lay flat -Some reactive airway disease with wheezing yesterday.  Added steroids. Today d/c for elevated WBC.  -Checked procalcitonin level - 0.28 elevated  3.  NSTEMI-could be demand ischemia. -Off heparin drip as troponins are plateaued. - Denies any active chest pain now.   -No plans for cardiac cath at this time -Continue aspirin, added beta-blocker and statin -Not a candidate for cardiac rehab given overall poor functioning  4.  Hyponatremia-secondary to underlying CHF.  -Lasix on hold. Continue to monitor -Nephrology following -trial of Tolvaptan at Dr. Keturah Barre discretion, pend as she is awaiting hospice qualification  5.  Acute renal failure on CKD stage III-IV: Nephrology has been consulted.   -Creatinine was 1.5 on admission, worsened up to 2.99 today.  Lasix has been held 2 days ago. -Gentle hydration today and closely monitor her sodium and respiratory status.-No prior outpatient follow-up. - Avoid nephrotoxins -Continue to monitor at this time  6.  DVT prophylaxis-on subcutaneous heparin  7.  Anemia of chronic disease-hemoglobin at 8.2.  Monitor closely. -Iron studies ordered, showing significantly low iron levels.  Ferritin is elevated as it is an acute phase reactant. -IV iron for 2 doses -No active indication for transfusion at this time. Continue to monitor   Physical therapy   She ambulates with a walker at baseline.   Palliative care consulted - we will need home health and palliative care at discharge Home with hospice if she qualifies.   Please update daughter [name] who is healthcare power of attorney daily Updated today, at bedside   All the records are reviewed and case is discussed with Care Management/Social Worker. Management plans discussed with the patient and/or family and they are in agreement.  CODE STATUS: DNR  TOTAL TIME TAKING CARE OF THIS PATIENT: 30 minutes.   More than 50% of the time was spent in counseling/coordination of care: YES  POSSIBLE D/C IN ? DAYS, DEPENDING ON CLINICAL CONDITION, PLACEMENT.    Cartier Washko PA-C on 10/26/2018 at 2:57 PM  Between 7am to 6pm - Pager - [phone]  After 6 pm go to [email or web address] - password [email or web address] [facility]  [employer] [place] [facility]  Office  [phone]  CC: Primary care physician; Lavera Guise, MD  Note: This dictation was prepared with Dragon dictation along with smaller phrase technology. Any transcriptional errors that result from this process are unintentional.

## 2018-10-26 NOTE — Progress Notes (Signed)
Family Meeting Note  Advance Directive:yes  Today a meeting took place with the Patient and Daughter at bedside and other (brenda) over phone.  The following clinical team members were present during this meeting:MD  The following were discussed:Patient's diagnosis: 85 y.o.femalewith a history of HLD, HTN, carotid arterial disease s/p bilateral CEA, CVA, & CKD stage III admitted for elevated troponin and may be acute bronchitis   Past Medical History:  Diagnosis Date  . Allergy   . Arthritis   . Carotid arterial disease ([place])    a. 2008 R CEA; b. 03/2018 s/p L CEA; c. 08/2018 Carotid U/S: RICA 7-09%, LICA 29-57%. Bilat ECA < 50%.  . CKD (chronic kidney disease), stage III ([place])   . Diastolic dysfunction    a. 03/2018 Echo: EF 55-60%, Gr1 DD. mIld AI/AS. Mild MR. MIldly dil LA.  Marland Kitchen GERD (gastroesophageal reflux disease)   . Hyperlipidemia   . Hypertension   . Insomnia   . Stroke ([place])       , Patient's progosis: < 6 months and Goals for treatment: DNR  Additional follow-up to be provided: Palliative care eval pending. Comfort care. Family is agreeable with [facility]. Will c/s CM  Time spent during discussion:16 mins  Max Sane, MD

## 2018-10-26 NOTE — Consult Note (Signed)
Pulmonary Medicine          Date: 10/26/2018,   MRN# [id number] [name] 11/16/1926     AdmissionWeight: 74.8 kg                 CurrentWeight: 74.9 kg      CHIEF COMPLAINT:   Dyspnea, malaise and chronic cough   HISTORY OF PRESENT ILLNESS   This is a pleasant 85 year old female with a history of dyslipidemia essential hypertension, hypothyroidism peripheral vascular disease generalized weakness chronically, diabetes type 2, GERD, history of allergic rhinitis and wheezing as well as chronic kidney disease.  Most of the history is provided by daughter Fraser Din at the bedside as patient is somewhat weak and is unable to provide detailed history.  As per daughter patient has been complaining of cough for last 2 months which has been productive with phlegm on expectoration.  She has not had sick contacts or febrile throughout this time however does report worsening dyspnea.  Patient is a lifelong non-smoker, does not have a personal history of asthma however has had wheezing and atopic history including allergic rhinitis, conjunctivitis and seasonal allergies.  She has been in the hospital since Tuesday, and on admission had mild troponin elevation to 0.7 as well as mild peripheral pitting edema in the lower extremities which responded well to a dose of Lasix.  Her creatinine has been trending up over the past week and she has been evaluated by nephrology.  I was asked by Dr. Max Sane to evaluate her due to worsening respiratory status.  Daughter Fraser Din who is patient's next of kin states that after lengthy discussion with patient she is choosing not to undergo any aggressive therapy and there has been some discussion regarding palliative care and hospice for patient.  PAST MEDICAL HISTORY   Past Medical History:  Diagnosis Date   Allergy    Arthritis    Carotid arterial disease ([place])    a. 2008 R CEA; b. 03/2018 s/p L CEA; c. 08/2018 Carotid U/S: RICA 8-25%, LICA 05-39%.  Bilat ECA < 50%.   CKD (chronic kidney disease), stage III ([facility])    Diastolic dysfunction    a. 03/2018 Echo: EF 55-60%, Gr1 DD. mIld AI/AS. Mild MR. MIldly dil LA.   GERD (gastroesophageal reflux disease)    Hyperlipidemia    Hypertension    Insomnia    Stroke [facility])      SURGICAL HISTORY   Past Surgical History:  Procedure Laterality Date   APPENDECTOMY     BACK SURGERY     CAROTID ENDARTERECTOMY Right    CHOLECYSTECTOMY     ENDARTERECTOMY Left 03/25/2018   Procedure: ENDARTERECTOMY CAROTID;  Surgeon: Algernon Huxley, MD;  Location: [facility] ORS;  Service: Vascular;  Laterality: Left;   EYE SURGERY Bilateral    FOOT SURGERY Left    HERNIA REPAIR     HIP SURGERY Left    TONSILLECTOMY       FAMILY HISTORY   Family History  Problem Relation Age of Onset   Congestive Heart Failure Mother    Heart disease Mother    Heart disease Father    Diabetes Brother      SOCIAL HISTORY   Social History   Tobacco Use   Smoking status: Never Smoker   Smokeless tobacco: Never Used  Substance Use Topics   Alcohol use: No   Drug use: No     MEDICATIONS    Home Medication:  Current Medication:  Current Facility-Administered Medications:    0.9 %  sodium chloride infusion, 250 mL, Intravenous, PRN, Pyreddy, Pavan, MD   acetaminophen (TYLENOL) tablet 650 mg, 650 mg, Oral, Q4H PRN, Pyreddy, Pavan, MD   allopurinol (ZYLOPRIM) tablet 200 mg, 200 mg, Oral, QHS, Lance Coon, MD, 200 mg at 10/25/18 2157   ALPRAZolam Duanne Moron) tablet 0.25 mg, 0.25 mg, Oral, QHS PRN, Pyreddy, Reatha Harps, MD, 0.25 mg at 10/25/18 2157   aspirin EC tablet 81 mg, 81 mg, Oral, Daily, Pyreddy, Pavan, MD, 81 mg at 10/26/18 0934   atorvastatin (LIPITOR) tablet 40 mg, 40 mg, Oral, q1800, Theora Gianotti, NP, 40 mg at 10/25/18 1706   bisacodyl (DULCOLAX) EC tablet 5 mg, 5 mg, Oral, Daily PRN, Mansy, Jan A, MD   carvedilol (COREG) tablet 3.125 mg, 3.125 mg, Oral, BID WC,  Pyreddy, Pavan, MD, 3.125 mg at 10/26/18 0934   cefTRIAXone (ROCEPHIN) 2 g in sodium chloride 0.9 % 100 mL IVPB, 2 g, Intravenous, Daily, Gladstone Lighter, MD, Last Rate: 200 mL/hr at 10/26/18 0940, 2 g at 10/26/18 0940   cholecalciferol (VITAMIN D) tablet 2,000 Units, 2,000 Units, Oral, Daily, Pyreddy, Reatha Harps, MD, 2,000 Units at 10/26/18 0934   clopidogrel (PLAVIX) tablet 75 mg, 75 mg, Oral, Q breakfast, Pyreddy, Pavan, MD, 75 mg at 10/26/18 0934   feeding supplement (ENSURE ENLIVE) (ENSURE ENLIVE) liquid 237 mL, 237 mL, Oral, BID BM, Gladstone Lighter, MD, 237 mL at 10/26/18 0946   furosemide (LASIX) 250 mg in dextrose 5 % 250 mL (1 mg/mL) infusion, 6 mg/hr, Intravenous, Continuous, Candiss Norse, Harmeet, MD   heparin injection 5,000 Units, 5,000 Units, Subcutaneous, Q8H, Gladstone Lighter, MD, 5,000 Units at 10/26/18 8527   iron sucrose (VENOFER) 200 mg in sodium chloride 0.9 % 150 mL IVPB, 200 mg, Intravenous, Q24H, Gladstone Lighter, MD, Stopped at 10/25/18 1906   MEDLINE mouth rinse, 15 mL, Mouth Rinse, BID, Pyreddy, Pavan, MD, 15 mL at 10/24/18 1327   methylPREDNISolone sodium succinate (SOLU-MEDROL) 125 mg/2 mL injection 60 mg, 60 mg, Intravenous, Q24H, Gladstone Lighter, MD, 60 mg at 10/25/18 1344   multivitamin with minerals tablet 1 tablet, 1 tablet, Oral, Daily, Pyreddy, Pavan, MD, 1 tablet at 10/26/18 0935   nitroGLYCERIN (NITROGLYN) 2 % ointment 0.5 inch, 0.5 inch, Topical, Q6H, Theora Gianotti, NP, 0.5 inch at 10/26/18 7824   nitroGLYCERIN (NITROSTAT) SL tablet 0.4 mg, 0.4 mg, Sublingual, Q5 Min x 3 PRN, Pyreddy, Pavan, MD, 0.4 mg at 10/23/18 0411   olopatadine (PATANOL) 0.1 % ophthalmic solution 1 drop, 1 drop, Both Eyes, BID, Pyreddy, Pavan, MD, 1 drop at 10/26/18 0935   ondansetron (ZOFRAN) injection 4 mg, 4 mg, Intravenous, Q6H PRN, Pyreddy, Pavan, MD   pantoprazole (PROTONIX) EC tablet 40 mg, 40 mg, Oral, Daily, Pyreddy, Pavan, MD, 40 mg at 10/26/18 0934    sodium chloride flush (NS) 0.9 % injection 3 mL, 3 mL, Intravenous, Q12H, Pyreddy, Pavan, MD, 3 mL at 10/26/18 0946   sodium chloride flush (NS) 0.9 % injection 3 mL, 3 mL, Intravenous, PRN, Pyreddy, Pavan, MD   sodium polystyrene (KAYEXALATE) 15 GM/60ML suspension 15 g, 15 g, Oral, Daily, Mansy, Jan A, MD, 15 g at 10/26/18 0651   traMADol (ULTRAM) tablet 50 mg, 50 mg, Oral, Q6H PRN, Gladstone Lighter, MD    ALLERGIES   Sulfa antibiotics     REVIEW OF SYSTEMS    Review of Systems:  Gen:  Denies  fever, sweats, chills weigh loss  HEENT: Denies blurred vision, double vision,  ear pain, eye pain, hearing loss, nose bleeds, sore throat Cardiac:  No dizziness, chest pain or heaviness, chest tightness,edema Resp:   Denies cough or sputum porduction, shortness of breath,wheezing, hemoptysis,  Gi: Denies swallowing difficulty, stomach pain, nausea or vomiting, diarrhea, constipation, bowel incontinence Gu:  Denies bladder incontinence, burning urine Ext:   Denies Joint pain, stiffness or swelling Skin: Denies  skin rash, easy bruising or bleeding or hives Endoc:  Denies polyuria, polydipsia , polyphagia or weight change Psych:   Denies depression, insomnia or hallucinations   Other:  All other systems negative   VS: BP (!) 111/43 (BP Location: Left Arm)    Pulse 69    Temp (!) 36.4 C (Oral)    Resp 20    Ht 5\' 2"  (1.575 m)    Wt 74.9 kg    SpO2 96%    BMI 30.22 kg/m      PHYSICAL EXAM    GENERAL:NAD, no fevers, chills, no weakness no fatigue HEAD: Normocephalic, atraumatic.  EYES: Pupils equal, round, reactive to light. Extraocular muscles intact. No scleral icterus.  MOUTH: Moist mucosal membrane. Dentition intact. No abscess noted.  EAR, NOSE, THROAT: Clear without exudates. No external lesions.  NECK: Supple. No thyromegaly. No nodules. No JVD.  PULMONARY: decreased breath sounds bilaterally, mild crackles at bases bilaterally. No wheezing or rhonchorous breath sounds.    CARDIOVASCULAR: S1 and S2. Regular rate and rhythm. No murmurs, rubs, or gallops. No edema. Pedal pulses 2+ bilaterally.  GASTROINTESTINAL: Soft, nontender, nondistended. No masses. Positive bowel sounds. No hepatosplenomegaly.  MUSCULOSKELETAL: No swelling, clubbing, or edema. Range of motion full in all extremities.  NEUROLOGIC: Cranial nerves II through XII are intact. No gross focal neurological deficits. Sensation intact. Reflexes intact.  SKIN: No ulceration, lesions, rashes, or cyanosis. Skin warm and dry. Turgor intact.  PSYCHIATRIC: Mood, affect within normal limits. The patient is awake, alert and oriented x 3. Insight, judgment intact.       IMAGING    Dg Chest 2 View  Result Date: 10/25/2018 CLINICAL DATA:  Shortness of breath. EXAM: CHEST - 2 VIEW COMPARISON:  11/12/2018. FINDINGS: Cardiomegaly. Interim improvement of bilateral pulmonary infiltrates suggesting improving pulmonary edema. Bibasilar atelectasis. Bilateral pleural effusions, left side greater than right. No pneumothorax. IMPRESSION: Cardiomegaly. Interim improvement of bilateral pulmonary infiltrates suggesting improving pulmonary edema. Bibasilar atelectasis. Bilateral pleural effusions. Electronically Signed   By: Marcello Moores  Register   On: 10/25/2018 09:23   Dg Chest 2 View  Result Date: 10/15/2018 CLINICAL DATA:  Initial evaluation for acute shortness of breath, hypoxia. EXAM: CHEST - 2 VIEW COMPARISON:  Prior radiograph from 03/17/2018. FINDINGS: Cardiomegaly, similar to previous. Mediastinal silhouette within normal limits. Aortic atherosclerosis. Lungs are hypoinflated. Moderate layering bilateral pleural effusions. Prominent perihilar vascular congestion with bilateral airspace opacities, most likely reflecting moderate diffuse pulmonary edema. Superimposed infection would be difficult to exclude. No pneumothorax. No acute osseous finding. Diffuse spurring noted within the thoracolumbar spine. Prominent degenerative  changes noted about the shoulders. IMPRESSION: 1. Cardiomegaly with bilateral pleural effusions and extensive bilateral airspace opacities, likely pulmonary edema. Superimposed infection would be difficult to exclude, and could be considered in the correct clinical setting. 2. Aortic atherosclerosis Electronically Signed   By: Jeannine Boga M.D.   On: 10/14/2018 16:42   US Venous Img Lower Unilateral Left  Result Date: 10/23/2018 CLINICAL DATA:  85 year old female with left lower extremity swelling and shortness of breath EXAM: LEFT LOWER EXTREMITY VENOUS DOPPLER ULTRASOUND TECHNIQUE: Gray-scale sonography with graded compression,  as well as color Doppler and duplex ultrasound were performed to evaluate the lower extremity deep venous systems from the level of the common femoral vein and including the common femoral, femoral, profunda femoral, popliteal and calf veins including the posterior tibial, peroneal and gastrocnemius veins when visible. The superficial great saphenous vein was also interrogated. Spectral Doppler was utilized to evaluate flow at rest and with distal augmentation maneuvers in the common femoral, femoral and popliteal veins. COMPARISON:  None. FINDINGS: Contralateral Common Femoral Vein: Respiratory phasicity is normal and symmetric with the symptomatic side. No evidence of thrombus. Normal compressibility. Common Femoral Vein: No evidence of thrombus. Normal compressibility, respiratory phasicity and response to augmentation. Saphenofemoral Junction: No evidence of thrombus. Normal compressibility and flow on color Doppler imaging. Profunda Femoral Vein: No evidence of thrombus. Normal compressibility and flow on color Doppler imaging. Femoral Vein: No evidence of thrombus. Normal compressibility, respiratory phasicity and response to augmentation. Popliteal Vein: No evidence of thrombus. Normal compressibility, respiratory phasicity and response to augmentation. Calf Veins: No  evidence of thrombus. Normal compressibility and flow on color Doppler imaging. Superficial Great Saphenous Vein: No evidence of thrombus. Normal compressibility. Venous Reflux:  None. Other Findings:  None. IMPRESSION: No evidence of deep venous thrombosis. Electronically Signed   By: Jacqulynn Cadet M.D.   On: 10/23/2018 10:07      ASSESSMENT/PLAN    Acute hypoxic respiratory failure     -Status post TTE with fairly preserved cardiac function however worsening CKD likely culprit of worsening pulmonary congestion.  Concomitant bibasilar atelectasis hindering pulmonary function.     -Has been weaned down to 5 L/min O2 via nasal cannula     - will order VEST therapy and incentive spirometer although patient is very weak and is unlikely to be compliant.      -PT/OT to help mobilize fluid and regain strength/range of motion/optimize for d/c     - will order daily albumin 25% to boost lasix efficacy at [facility]     - Nephrology on case - Dr Candiss Norse - appreciate input    - + blood culture - strep sprecies-no sensitivity panel - Procal elevated, WBC elevated (pt on solumedrol? Will d/c for now)    - will change rocephin to vibramycin for better Gm+ coverage    -albuterol nebs q6h - pt has hx of wheezing, possibly due to undiagnosed asthma          Thank you for allowing me to participate in the care of this patient, will continue to follow.    Patient/Family are satisfied with care plan and all questions have been answered.  This document was prepared using Dragon voice recognition software and may include unintentional dictation errors.     Ottie Glazier, M.D.  Division of [place]

## 2018-10-26 NOTE — Progress Notes (Signed)
[facility], [place] 10/26/18  Subjective:   Continues to have coughing, decreased appetite, has been feeling sick.  Family reports that she has no energy.  Her legs are weak.  She denies he was able to get up to go to bathroom. Na is lower at 121 Creatinine is worse Potassium is elevated   Objective:  Vital signs in last 24 hours:  Temp:  [97.5 F (36.4 C)-98.3 F (36.8 C)] 97.5 F (36.4 C) (03/14 0721) Pulse Rate:  [61-74] 69 (03/14 0721) Resp:  [19-20] 20 (03/14 0721) BP: (89-118)/(31-68) 111/43 (03/14 0721) SpO2:  [96 %-100 %] 96 % (03/14 0721) Weight:  [74.9 kg] 74.9 kg (03/14 0408)  Weight change: 0.136 kg Filed Weights   10/24/18 0344 10/25/18 0527 10/26/18 0408  Weight: 76.7 kg 74.8 kg 74.9 kg    Intake/Output:    Intake/Output Summary (Last 24 hours) at 10/26/2018 5852 Last data filed at 10/26/2018 7782 Gross per 24 hour  Intake 1184.78 ml  Output 150 ml  Net 1034.78 ml    Physical Exam: General:  Frail, elderly woman, laying in the bed  HEENT  anicteric, moist oral mucous membranes  Neck:  Supple, no masses  Lungs:  Normal breathing effort, diffuse bilateral crackles  Heart::  No rub  Abdomen:  Soft, nontender  Extremities:  No peripheral edema,   Neurologic:  Alert, able to answer questions appropriately  Skin:  No acute rashes     Basic Metabolic Panel:  Recent Labs  Lab 11/07/2018 1710 10/20/2018 2131 10/23/18 0321 10/24/18 0510 10/25/18 0516 10/26/18 0539  NA 126* 128* 130* 128* 127* 121*  K 4.9  --  4.5 4.3 4.8 5.5*  CL 95*  --  96* 94* 93* 91*  CO2 20*  --  22 24 24  18*  GLUCOSE 141*  --  136* 116* 125* 166*  BUN 35*  --  36* 48* 64* 74*  CREATININE 1.53*  --  1.59* 2.02* 2.42* 2.99*  CALCIUM 8.8*  --  9.1 8.4* 8.3* 7.9*     CBC: Recent Labs  Lab 11/09/2018 1605 10/23/18 0321 10/24/18 0510 10/25/18 0516 10/26/18 0539  WBC 13.0* 17.0* 14.7* 15.5*  15.7* 12.7*  NEUTROABS  --   --   --  12.5*  --    HGB 9.1* 8.4* 7.4* 8.2*  8.2* 8.2*  HCT 26.7* 24.8* 22.4* 24.8*  24.6* 25.0*  MCV 96.0 95.4 96.1 98.0  97.2 99.6  PLT 329 287 274 314  304 340     No results found for: HEPBSAG, HEPBSAB, HEPBIGM    Microbiology:  Recent Results (from the past 240 hour(s))  CULTURE, BLOOD (ROUTINE X 2) w Reflex to ID Panel     Status: None (Preliminary result)   Collection Time: 10/23/18  1:07 PM  Result Value Ref Range Status   Specimen Description   Final    BLOOD BLOOD RIGHT WRIST Performed at [facility], [address] [place], [place] [address]    Special Requests   Final    BOTTLES DRAWN AEROBIC AND ANAEROBIC Blood Culture adequate volume Performed at [facility], [address] [place], [place] [address]    Culture  Setup Time   Final    GRAM POSITIVE COCCI ANAEROBIC BOTTLE ONLY CRITICAL RESULT CALLED TO, READ BACK BY AND VERIFIED WITH: C/T KAREN HAYES AT 1540 10/24/18 KLM    Culture   Final    GRAM POSITIVE COCCI CULTURE REINCUBATED FOR BETTER GROWTH Performed at  [facility], [place] [address], [place], [place] [address]    Report Status PENDING  Incomplete  Blood Culture ID Panel (Reflexed)     Status: Abnormal   Collection Time: 10/23/18  1:07 PM  Result Value Ref Range Status   Enterococcus species NOT DETECTED NOT DETECTED Final   Listeria monocytogenes NOT DETECTED NOT DETECTED Final   Staphylococcus species NOT DETECTED NOT DETECTED Final   Staphylococcus aureus (BCID) NOT DETECTED NOT DETECTED Final   Streptococcus species DETECTED (A) NOT DETECTED Final    Comment: Not Enterococcus species, Streptococcus agalactiae, Streptococcus pyogenes, or Streptococcus pneumoniae. CRITICAL RESULT CALLED TO, READ BACK BY AND VERIFIED WITH: C/T KAREN HAYES AT 6045 10/24/18 KLM    Streptococcus agalactiae NOT DETECTED NOT DETECTED Final   Streptococcus pneumoniae NOT DETECTED NOT DETECTED Final   Streptococcus pyogenes NOT DETECTED NOT DETECTED Final    Acinetobacter baumannii NOT DETECTED NOT DETECTED Final   Enterobacteriaceae species NOT DETECTED NOT DETECTED Final   Enterobacter cloacae complex NOT DETECTED NOT DETECTED Final   Escherichia coli NOT DETECTED NOT DETECTED Final   Klebsiella oxytoca NOT DETECTED NOT DETECTED Final   Klebsiella pneumoniae NOT DETECTED NOT DETECTED Final   Proteus species NOT DETECTED NOT DETECTED Final   Serratia marcescens NOT DETECTED NOT DETECTED Final   Haemophilus influenzae NOT DETECTED NOT DETECTED Final   Neisseria meningitidis NOT DETECTED NOT DETECTED Final   Pseudomonas aeruginosa NOT DETECTED NOT DETECTED Final   Candida albicans NOT DETECTED NOT DETECTED Final   Candida glabrata NOT DETECTED NOT DETECTED Final   Candida krusei NOT DETECTED NOT DETECTED Final   Candida parapsilosis NOT DETECTED NOT DETECTED Final   Candida tropicalis NOT DETECTED NOT DETECTED Final    Comment: Performed at [facility], [address] [place], [place] [address]  CULTURE, BLOOD (ROUTINE X 2) w Reflex to ID Panel     Status: None (Preliminary result)   Collection Time: 10/23/18  1:16 PM  Result Value Ref Range Status   Specimen Description BLOOD BLOOD LEFT WRIST  Final   Special Requests   Final    BOTTLES DRAWN AEROBIC AND ANAEROBIC Blood Culture adequate volume   Culture   Final    NO GROWTH 3 DAYS Performed at [facility], [address] [place], [place] [address]    Report Status PENDING  Incomplete  Expectorated sputum assessment w rflx to resp cult     Status: None   Collection Time: 10/23/18  9:31 PM  Result Value Ref Range Status   Specimen Description EXPECTORATED SPUTUM  Final   Special Requests Normal  Final   Sputum evaluation   Final    THIS SPECIMEN IS ACCEPTABLE FOR SPUTUM CULTURE Performed at [facility], [address], [place], [place] [address]    Report Status 10/23/2018 FINAL  Final  Culture, respiratory     Status: None (Preliminary result)    Collection Time: 10/23/18  9:31 PM  Result Value Ref Range Status   Specimen Description   Final    EXPECTORATED SPUTUM Performed at [facility], [address] [place], [place] [address]    Special Requests   Final    Normal Reflexed from [id number] Performed at [facility], [address] [place], [place] [address]    Gram Stain   Final    RARE WBC PRESENT, PREDOMINANTLY PMN RARE SQUAMOUS EPITHELIAL CELLS PRESENT MODERATE GRAM POSITIVE COCCI RARE GRAM POSITIVE RODS    Culture  Final    CULTURE REINCUBATED FOR BETTER GROWTH Performed at [facility], [place] [address] [place], [place] [address]    Report Status PENDING  Incomplete    Coagulation Studies: Recent Labs    10/23/18 1640 10/24/18 0510 10/24/18 1650 10/25/18 0516  LABPROT 14.4 14.7 12.4 13.4  INR 1.1 1.2 0.9 1.0    Urinalysis: No results for input(s): COLORURINE, LABSPEC, PHURINE, GLUCOSEU, HGBUR, BILIRUBINUR, KETONESUR, PROTEINUR, UROBILINOGEN, NITRITE, LEUKOCYTESUR in the last 72 hours.  Invalid input(s): APPERANCEUR    Imaging: Dg Chest 2 View  Result Date: 10/25/2018 CLINICAL DATA:  Shortness of breath. EXAM: CHEST - 2 VIEW COMPARISON:  11/06/2018. FINDINGS: Cardiomegaly. Interim improvement of bilateral pulmonary infiltrates suggesting improving pulmonary edema. Bibasilar atelectasis. Bilateral pleural effusions, left side greater than right. No pneumothorax. IMPRESSION: Cardiomegaly. Interim improvement of bilateral pulmonary infiltrates suggesting improving pulmonary edema. Bibasilar atelectasis. Bilateral pleural effusions. Electronically Signed   By: Marcello Moores  Register   On: 10/25/2018 09:23     Medications:   . sodium chloride    . cefTRIAXone (ROCEPHIN)  IV 2 g (10/26/18 0940)  . iron sucrose Stopped (10/25/18 1906)   . allopurinol  200 mg Oral QHS  . aspirin EC  81 mg Oral Daily  . atorvastatin  40 mg Oral q1800  . carvedilol  3.125 mg Oral BID WC  .  cholecalciferol  2,000 Units Oral Daily  . clopidogrel  75 mg Oral Q breakfast  . feeding supplement (ENSURE ENLIVE)  237 mL Oral BID BM  . heparin injection (subcutaneous)  5,000 Units Subcutaneous Q8H  . mouth rinse  15 mL Mouth Rinse BID  . methylPREDNISolone (SOLU-MEDROL) injection  60 mg Intravenous Q24H  . multivitamin with minerals  1 tablet Oral Daily  . nitroGLYCERIN  0.5 inch Topical Q6H  . olopatadine  1 drop Both Eyes BID  . pantoprazole  40 mg Oral Daily  . sodium chloride flush  3 mL Intravenous Q12H  . sodium polystyrene  15 g Oral Daily   sodium chloride, acetaminophen, ALPRAZolam, bisacodyl, nitroGLYCERIN, ondansetron (ZOFRAN) IV, sodium chloride flush, traMADol  Assessment/ Plan:  85 y.o.caucasian female with hypertension, chronic kidney disease, history of stroke, carotid endarterectomy, was admitted on 10/27/2018 with shortness of breath and non-STEMI.   1.  Hyponatremia 2.  Acute kidney injury 3.  Chronic kidney disease stage III 4.  Proteinuria 5.  Hypertension with CKD 6.  Acute pulmonary edema 7.  Non-STEMI 8.  Pneumonia, community acquired  Patient appears to have chronic kidney disease with baseline creatinine of 1.53/GFR 38 at the time of presentation.  Sodium was low at 126.  With IV diuresis, sodium has improved to 130.   Her clinical condition has worsened including lab parameters with higher creatinine, worsening potassium and worsening sodium.  She appears to be now has some fluid overload. We discussed with patient and her daughter that her volume status and clinical condition is critical.  We will start Lasix infusion for gentle diuretic effect.  We also discussed that if renal function continues to worsen it may come to the point where the decision about life support/aggressive care/dialysis.  Patient and her daughter both state that she does not want aggressive care including ventilator and dialysis.  For now, we will manage conservatively Agree  with primary team to get palliative care involved.   LOS: [employer] 3/14/20209:52 AM  [place], [place]  Note: This note was prepared with Dragon dictation. Any transcription  errors are unintentional

## 2018-10-26 NOTE — Progress Notes (Signed)
MD notified. Pt has nitro paste order. Pt has no CP and BP trends low. MD orders to discontinue. I will continue to assess.

## 2018-10-26 NOTE — Progress Notes (Signed)
Spoke with patient's daughter and patient and she are not wanting respiratory treatments or respiratory modalities.  Also spoke with Dr. Manuella Ghazi who stated to discontinue respiratory orders.

## 2018-10-26 NOTE — Progress Notes (Signed)
Pt has not had a BM since she has been here, last BM 10/14/2018. Labs back this morning, potassium is 5.5. Dr. Sidney Ace paged, he gave orders for dulcolax PRN & kayexalate daily, will give kayexalate and continue to monitor.  Conley Simmonds, RN, BSN

## 2018-10-27 LAB — CBC
HCT: 23.3 % — ABNORMAL LOW (ref 36.0–46.0)
Hemoglobin: 7.7 g/dL — ABNORMAL LOW (ref 12.0–15.0)
MCH: 32.2 pg (ref 26.0–34.0)
MCHC: 33 g/dL (ref 30.0–36.0)
MCV: 97.5 fL (ref 80.0–100.0)
Platelets: 344 K/uL (ref 150–400)
RBC: 2.39 MIL/uL — ABNORMAL LOW (ref 3.87–5.11)
RDW: 13.3 % (ref 11.5–15.5)
WBC: 17.4 K/uL — ABNORMAL HIGH (ref 4.0–10.5)
nRBC: 0.2 % (ref 0.0–0.2)

## 2018-10-27 LAB — BASIC METABOLIC PANEL WITH GFR
Anion gap: 13 (ref 5–15)
BUN: 82 mg/dL — ABNORMAL HIGH (ref 8–23)
CO2: 19 mmol/L — ABNORMAL LOW (ref 22–32)
Calcium: 7.9 mg/dL — ABNORMAL LOW (ref 8.9–10.3)
Chloride: 90 mmol/L — ABNORMAL LOW (ref 98–111)
Creatinine, Ser: 3.39 mg/dL — ABNORMAL HIGH (ref 0.44–1.00)
GFR calc Af Amer: 13 mL/min — ABNORMAL LOW
GFR calc non Af Amer: 11 mL/min — ABNORMAL LOW
Glucose, Bld: 130 mg/dL — ABNORMAL HIGH (ref 70–99)
Potassium: 5.5 mmol/L — ABNORMAL HIGH (ref 3.5–5.1)
Sodium: 122 mmol/L — ABNORMAL LOW (ref 135–145)

## 2018-10-27 MED ORDER — LORAZEPAM 2 MG/ML IJ SOLN
2.0000 mg | INTRAMUSCULAR | Status: DC | PRN
  Administered 2018-10-28: 2 mg via INTRAVENOUS
  Filled 2018-10-27: qty 1

## 2018-10-27 MED ORDER — GLYCOPYRROLATE 1 MG PO TABS
1.0000 mg | ORAL_TABLET | ORAL | Status: DC | PRN
  Filled 2018-10-27: qty 1

## 2018-10-27 MED ORDER — DIPHENHYDRAMINE HCL 50 MG/ML IJ SOLN: 25.0000 mg | INTRAMUSCULAR | Status: DC | PRN

## 2018-10-27 MED ORDER — IPRATROPIUM-ALBUTEROL 0.5-2.5 (3) MG/3ML IN SOLN: 3.0000 mL | Freq: Four times a day (QID) | RESPIRATORY_TRACT | Status: DC

## 2018-10-27 MED ORDER — HALOPERIDOL LACTATE 5 MG/ML IJ SOLN: 2.5000 mg | INTRAMUSCULAR | Status: DC | PRN

## 2018-10-27 MED ORDER — MORPHINE SULFATE (PF) 2 MG/ML IV SOLN
2.0000 mg | INTRAVENOUS | Status: DC | PRN
  Administered 2018-10-27 – 2018-10-28 (×4): 2 mg via INTRAVENOUS
  Filled 2018-10-27 (×4): qty 1

## 2018-10-27 MED ORDER — ACETAMINOPHEN 325 MG PO TABS: 650.0000 mg | ORAL_TABLET | Freq: Four times a day (QID) | ORAL | Status: DC | PRN

## 2018-10-27 MED ORDER — GLYCOPYRROLATE 0.2 MG/ML IJ SOLN
0.2000 mg | INTRAMUSCULAR | Status: DC | PRN
  Filled 2018-10-27: qty 1

## 2018-10-27 MED ORDER — DEXTROSE 5 % IV SOLN: INTRAVENOUS | Status: DC

## 2018-10-27 MED ORDER — ACETAMINOPHEN 650 MG RE SUPP: 650.0000 mg | Freq: Four times a day (QID) | RECTAL | Status: DC | PRN

## 2018-10-27 MED ORDER — POLYVINYL ALCOHOL 1.4 % OP SOLN
1.0000 [drp] | Freq: Four times a day (QID) | OPHTHALMIC | Status: DC | PRN
  Filled 2018-10-27: qty 15

## 2018-10-27 NOTE — Progress Notes (Signed)
Pulmonary Medicine          Date: 10/27/2018,   MRN# [id number] [name] 25-Nov-1926     AdmissionWeight: 74.8 kg                 CurrentWeight: 74 kg        SUBJECTIVE   Patient resting in bed comfortably.  Discussed current plan with daughter Fraser Din and SIL Elita Quick.  Plan for home hospice, will sign off from pulmonary.  Thank you for consultation.    PAST MEDICAL HISTORY   Past Medical History:  Diagnosis Date  . Allergy   . Arthritis   . Carotid arterial disease ([place])    a. 2008 R CEA; b. 03/2018 s/p L CEA; c. 08/2018 Carotid U/S: RICA 0-63%, LICA 01-60%. Bilat ECA < 50%.  . CKD (chronic kidney disease), stage III ([place])   . Diastolic dysfunction    a. 03/2018 Echo: EF 55-60%, Gr1 DD. mIld AI/AS. Mild MR. MIldly dil LA.  Marland Kitchen GERD (gastroesophageal reflux disease)   . Hyperlipidemia   . Hypertension   . Insomnia   . Stroke [facility])      SURGICAL HISTORY   Past Surgical History:  Procedure Laterality Date  . APPENDECTOMY    . BACK SURGERY    . CAROTID ENDARTERECTOMY Right   . CHOLECYSTECTOMY    . ENDARTERECTOMY Left 03/25/2018   Procedure: ENDARTERECTOMY CAROTID;  Surgeon: Algernon Huxley, MD;  Location: [facility] ORS;  Service: Vascular;  Laterality: Left;  . EYE SURGERY Bilateral   . FOOT SURGERY Left   . HERNIA REPAIR    . HIP SURGERY Left   . TONSILLECTOMY       FAMILY HISTORY   Family History  Problem Relation Age of Onset  . Congestive Heart Failure Mother   . Heart disease Mother   . Heart disease Father   . Diabetes Brother      SOCIAL HISTORY   Social History   Tobacco Use  . Smoking status: Never Smoker  . Smokeless tobacco: Never Used  Substance Use Topics  . Alcohol use: No  . Drug use: No     MEDICATIONS    Home Medication:    Current Medication:  Current Facility-Administered Medications:  .  0.9 %  sodium chloride infusion, 250 mL, Intravenous, PRN, Saundra Shelling, MD, Stopped at 10/26/18 2248 .  acetaminophen  (TYLENOL) tablet 650 mg, 650 mg, Oral, Q6H PRN **OR** acetaminophen (TYLENOL) suppository 650 mg, 650 mg, Rectal, Q6H PRN, Max Sane, MD .  acetaminophen (TYLENOL) tablet 650 mg, 650 mg, Oral, Q4H PRN, Pyreddy, Pavan, MD .  ALPRAZolam Duanne Moron) tablet 0.25 mg, 0.25 mg, Oral, QHS PRN, Pyreddy, Pavan, MD, 0.25 mg at 10/25/18 2157 .  bisacodyl (DULCOLAX) EC tablet 5 mg, 5 mg, Oral, Daily PRN, Mansy, Jan A, MD .  diphenhydrAMINE (BENADRYL) injection 25 mg, 25 mg, Intravenous, Q4H PRN, Manuella Ghazi, Vipul, MD .  glycopyrrolate (ROBINUL) tablet 1 mg, 1 mg, Oral, Q4H PRN **OR** glycopyrrolate (ROBINUL) injection 0.2 mg, 0.2 mg, Subcutaneous, Q4H PRN **OR** glycopyrrolate (ROBINUL) injection 0.2 mg, 0.2 mg, Intravenous, Q4H PRN, Manuella Ghazi, Vipul, MD .  haloperidol lactate (HALDOL) injection 2.5-5 mg, 2.5-5 mg, Intravenous, Q4H PRN, Manuella Ghazi, Vipul, MD .  LORazepam (ATIVAN) injection 2-4 mg, 2-4 mg, Intravenous, Q4H PRN, Max Sane, MD .  MEDLINE mouth rinse, 15 mL, Mouth Rinse, BID, Pyreddy, Pavan, MD, 15 mL at 10/24/18 1327 .  morphine 2 MG/ML injection 2 mg, 2 mg, Intravenous, Q2H  PRN, Max Sane, MD, 2 mg at 10/27/18 3532 .  olopatadine (PATANOL) 0.1 % ophthalmic solution 1 drop, 1 drop, Both Eyes, BID, Pyreddy, Pavan, MD, 1 drop at 10/26/18 2233 .  ondansetron (ZOFRAN) injection 4 mg, 4 mg, Intravenous, Q6H PRN, Pyreddy, Pavan, MD .  polyvinyl alcohol (LIQUIFILM TEARS) 1.4 % ophthalmic solution 1 drop, 1 drop, Both Eyes, QID PRN, Manuella Ghazi, Vipul, MD .  sodium chloride flush (NS) 0.9 % injection 3 mL, 3 mL, Intravenous, Q12H, Pyreddy, Pavan, MD, 3 mL at 10/26/18 0946 .  sodium chloride flush (NS) 0.9 % injection 3 mL, 3 mL, Intravenous, PRN, Pyreddy, Pavan, MD    ALLERGIES   Sulfa antibiotics     REVIEW OF SYSTEMS    Review of Systems:  Gen:  Denies  fever, sweats, chills weigh loss  HEENT: Denies blurred vision, double vision, ear pain, eye pain, hearing loss, nose bleeds, sore throat Cardiac:  No  dizziness, chest pain or heaviness, chest tightness,edema Resp:   Denies cough or sputum porduction, shortness of breath,wheezing, hemoptysis,  Gi: Denies swallowing difficulty, stomach pain, nausea or vomiting, diarrhea, constipation, bowel incontinence Gu:  Denies bladder incontinence, burning urine Ext:   Denies Joint pain, stiffness or swelling Skin: Denies  skin rash, easy bruising or bleeding or hives Endoc:  Denies polyuria, polydipsia , polyphagia or weight change Psych:   Denies depression, insomnia or hallucinations   Other:  All other systems negative   VS: BP (!) 111/54 (BP Location: Right Arm)   Pulse 65   Temp 36.6 C (Oral)   Resp 18   Ht 5\' 2"  (1.575 m)   Wt 74 kg   SpO2 96%   BMI 29.84 kg/m      PHYSICAL EXAM    GENERAL:NAD, no fevers, chills, no weakness no fatigue HEAD: Normocephalic, atraumatic.  EYES: Pupils equal, round, reactive to light. Extraocular muscles intact. No scleral icterus.  MOUTH: Moist mucosal membrane. Dentition intact. No abscess noted.  EAR, NOSE, THROAT: Clear without exudates. No external lesions.  NECK: Supple. No thyromegaly. No nodules. No JVD.  PULMONARY: decreased breath sounds bilaterally CARDIOVASCULAR: S1 and S2. Regular rate and rhythm. No murmurs, rubs, or gallops. No edema. Pedal pulses 2+ bilaterally.  GASTROINTESTINAL: Soft, nontender, nondistended. No masses. Positive bowel sounds. No hepatosplenomegaly.  MUSCULOSKELETAL: No swelling, clubbing, or edema. Range of motion full in all extremities.  NEUROLOGIC: Cranial nerves II through XII are intact. No gross focal neurological deficits. Sensation intact. Reflexes intact.  SKIN: No ulceration, lesions, rashes, or cyanosis. Skin warm and dry. Turgor intact.  PSYCHIATRIC: Mood, affect within normal limits. The patient is awake, alert and oriented x 3. Insight, judgment intact.       IMAGING    Dg Chest 2 View  Result Date: 10/25/2018 CLINICAL DATA:  Shortness of  breath. EXAM: CHEST - 2 VIEW COMPARISON:  10/16/2018. FINDINGS: Cardiomegaly. Interim improvement of bilateral pulmonary infiltrates suggesting improving pulmonary edema. Bibasilar atelectasis. Bilateral pleural effusions, left side greater than right. No pneumothorax. IMPRESSION: Cardiomegaly. Interim improvement of bilateral pulmonary infiltrates suggesting improving pulmonary edema. Bibasilar atelectasis. Bilateral pleural effusions. Electronically Signed   By: Marcello Moores  Register   On: 10/25/2018 09:23   Dg Chest 2 View  Result Date: 11/03/2018 CLINICAL DATA:  Initial evaluation for acute shortness of breath, hypoxia. EXAM: CHEST - 2 VIEW COMPARISON:  Prior radiograph from 03/17/2018. FINDINGS: Cardiomegaly, similar to previous. Mediastinal silhouette within normal limits. Aortic atherosclerosis. Lungs are hypoinflated. Moderate layering bilateral pleural effusions. Prominent perihilar vascular  congestion with bilateral airspace opacities, most likely reflecting moderate diffuse pulmonary edema. Superimposed infection would be difficult to exclude. No pneumothorax. No acute osseous finding. Diffuse spurring noted within the thoracolumbar spine. Prominent degenerative changes noted about the shoulders. IMPRESSION: 1. Cardiomegaly with bilateral pleural effusions and extensive bilateral airspace opacities, likely pulmonary edema. Superimposed infection would be difficult to exclude, and could be considered in the correct clinical setting. 2. Aortic atherosclerosis Electronically Signed   By: Jeannine Boga M.D.   On: 10/24/2018 16:42   US Venous Img Lower Unilateral Left  Result Date: 10/23/2018 CLINICAL DATA:  85 year old female with left lower extremity swelling and shortness of breath EXAM: LEFT LOWER EXTREMITY VENOUS DOPPLER ULTRASOUND TECHNIQUE: Gray-scale sonography with graded compression, as well as color Doppler and duplex ultrasound were performed to evaluate the lower extremity deep venous  systems from the level of the common femoral vein and including the common femoral, femoral, profunda femoral, popliteal and calf veins including the posterior tibial, peroneal and gastrocnemius veins when visible. The superficial great saphenous vein was also interrogated. Spectral Doppler was utilized to evaluate flow at rest and with distal augmentation maneuvers in the common femoral, femoral and popliteal veins. COMPARISON:  None. FINDINGS: Contralateral Common Femoral Vein: Respiratory phasicity is normal and symmetric with the symptomatic side. No evidence of thrombus. Normal compressibility. Common Femoral Vein: No evidence of thrombus. Normal compressibility, respiratory phasicity and response to augmentation. Saphenofemoral Junction: No evidence of thrombus. Normal compressibility and flow on color Doppler imaging. Profunda Femoral Vein: No evidence of thrombus. Normal compressibility and flow on color Doppler imaging. Femoral Vein: No evidence of thrombus. Normal compressibility, respiratory phasicity and response to augmentation. Popliteal Vein: No evidence of thrombus. Normal compressibility, respiratory phasicity and response to augmentation. Calf Veins: No evidence of thrombus. Normal compressibility and flow on color Doppler imaging. Superficial Great Saphenous Vein: No evidence of thrombus. Normal compressibility. Venous Reflux:  None. Other Findings:  None. IMPRESSION: No evidence of deep venous thrombosis. Electronically Signed   By: Jacqulynn Cadet M.D.   On: 10/23/2018 10:07      ASSESSMENT/PLAN   Acute hypoxic respiratory failure     -Status post TTE with fairly preserved cardiac function however worsening CKD likely culprit of worsening pulmonary congestion.  Concomitant bibasilar atelectasis hindering pulmonary function.     -Has been weaned down to 5 L/min O2 via nasal cannula     - will order VEST therapy and incentive spirometer although patient is very weak and is unlikely to  be compliant.    - will change rocephin to vibramycin for better Gm+ coverage    -albuterol nebs q6h - pt has hx of wheezing, possibly due to undiagnosed asthma-patient did not tolerate due to weak state    -will sign off , thank you for consultation       Thank you for allowing me to participate in the care of this patient.  This document was prepared using Dragon voice recognition software and may include unintentional dictation errors.     Ottie Glazier, M.D.  Division of [place]

## 2018-10-27 NOTE — TOC Transition Note (Signed)
Transition of Care [facility]) - CM/SW Discharge Note   Patient Details  Name: [name] MRN: [id number] Date of Birth: 1927/03/08  Transition of [facility]) CM/SW Contact:  Latanya Maudlin, RN Phone Number: 10/27/2018, 11:56 AM   Clinical Narrative:  Unfortunately, patient has had significant decline in status and is now comfort care. Patient, family, RNCM and MD had lengthy discussion around transition of care planning. Family expresses desire to keep patient comfortable. Familles preference would be to take the patient home if able. Per Dr Manuella Ghazi that will likely be Monday or Tuesday depending on clinical condition. CMS Medicare.gov Compare Post Acute Care list reviewed with patient and they are familiar with [facility] now know as [facility]. We will request that Flo Shanks, who is the [facility] liaison to meet with them to discuss specifics. They express need for oxygen and hospital bed.      Final next level of care: Home w Hospice Care Barriers to Discharge: Continued Medical Work up   Patient Goals and CMS Choice Patient states their goals for this hospitalization and ongoing recovery are:: Retrun home with [facility] through [facility] CMS Medicare.gov Compare Post Acute Care list provided to:: Patient Choice offered to / list presented to : Patient, Adult Children  Discharge Placement                       Discharge Plan and Services Discharge Planning Services: CM Consult Post Acute Care Choice: Home Health              HH Arranged: RN, PT, Nurse's Aide [facility] Agency: [facility]   Social Determinants of Health (SDOH) Interventions     Readmission Risk Interventions No flowsheet data found.

## 2018-10-27 NOTE — Progress Notes (Signed)
[facility] NAME: [name]    MR#:  [id number]  DATE OF BIRTH:  1926/12/26  SUBJECTIVE:  CHIEF COMPLAINT:   Chief Complaint  Patient presents with  . Shortness of Breath   Family agreeable with comfort care. Patient sleepy REVIEW OF SYSTEMS:  Review of Systems  Constitutional: Positive for malaise/fatigue. Negative for chills and fever.  HENT: Negative for congestion, ear discharge, hearing loss and nosebleeds.   Eyes: Negative for blurred vision and double vision.  Respiratory: Positive for cough and shortness of breath. Negative for wheezing.   Cardiovascular: Positive for orthopnea and leg swelling. Negative for chest pain and palpitations.  Gastrointestinal: Negative for abdominal pain, constipation, diarrhea, nausea and vomiting.  Genitourinary: Negative for dysuria.  Musculoskeletal: Negative for myalgias.  Neurological: Positive for weakness. Negative for dizziness, focal weakness, seizures, and headaches.  Psychiatric/Behavioral: Negative for depression.  DRUG ALLERGIES:   Allergies  Allergen Reactions  . Sulfa Antibiotics    VITALS:  Blood pressure (!) 111/54, pulse 65, temperature 97.9 F (36.6 C), temperature source Oral, resp. rate 18, height 5\' 2"  (1.575 m), weight 74 kg, SpO2 96 %. PHYSICAL EXAMINATION:  GENERAL:  85 y.o.-year-old elderly patient lying in the bed with no acute distress. Appears ill, lethargic.  EYES: Pupils equal, round, reactive to light and accommodation. No scleral icterus. Extraocular muscles intact.  HEENT: Head atraumatic, normocephalic. Oropharynx and nasopharynx clear.  NECK:  Supple, no jugular venous distention. No thyroid enlargement, no tenderness.  LUNGS: coarse breath sounds bilaterally, has scattered rhonchi and wheezing.  rales at the bases, no crepitation. No use of accessory muscles of respiration.  CARDIOVASCULAR: S1, S2 normal. No  rubs, or gallops. 3/6 systolic  murmur present. ABDOMEN: Soft, nontender, nondistended. Bowel sounds present. No organomegaly or mass.  EXTREMITIES: + pedal edema, no cyanosis, or clubbing.  NEUROLOGIC: Cranial nerves II through XII are intact. Sensation intact. Gait not checked. Global weakness noted. PSYCHIATRIC: The patient is sleepy  SKIN: Diffuse ecchymoses upper extremities.  LABORATORY PANEL:  Female CBC Recent Labs  Lab 10/27/18 0426  WBC 17.4*  HGB 7.7*  HCT 23.3*  PLT 344   ------------------------------------------------------------------------------------------------------------------ Chemistries  Recent Labs  Lab 11/02/2018 1710  10/27/18 0426  NA 126*   < > 122*  K 4.9   < > 5.5*  CL 95*   < > 90*  CO2 20*   < > 19*  GLUCOSE 141*   < > 130*  BUN 35*   < > 82*  CREATININE 1.53*   < > 3.39*  CALCIUM 8.8*   < > 7.9*  AST 52*  --   --   ALT 42  --   --   ALKPHOS 86  --   --   BILITOT 0.7  --   --    < > = values in this interval not displayed.   RADIOLOGY:  No results found. ASSESSMENT AND PLAN:  85 year old female with PMH of CKD stage III, GERD, hypertension, history of stroke who lives with her daughter, came in for worsening shortness of breath.   1.  Acute diastolic congestive heart failure-fluid restriction, daily weights  2.  Community-acquired pneumonia- treated  3.  NSTEMI-could be demand ischemia.  4.  Hyponatremia - worsening  5.  Acute renal failure on CKD stage III-IV:  6.  Acute on chronic hypoxic resp failure  7.  Anemia of chronic disease-hemoglobin at 7.7  Continues to get worse, Na  lower, K higher, Creatinine worsening, potassium elevated. Plan for home hospice if she qualifies, CM [name] working on setting that up. Will transition to comfort measures today.   Overall prognosis very poor. She may pass here.   Palliative care consulted - Home with hospice tomorrow. Family in agreement    I tried to call daughter [name] who is healthcare power of attorney  twice this morning with no success.   All the records are reviewed and case is discussed with Care Management/Social Worker. Management plans discussed with the patient and/or family and they are in agreement.  CODE STATUS: DNR, comfort care  TOTAL TIME TAKING CARE OF THIS PATIENT: 30 minutes.   More than 50% of the time was spent in counseling/coordination of care: YES  POSSIBLE D/C IN 1 DAYS, DEPENDING ON CLINICAL CONDITION, PLACEMENT.    Otha Monical Manuella Ghazi PA-C on 10/27/2018 at 8:15 AM  Between 7am to 6pm - Pager - [phone]  After 6 pm go to [email or web address] - password [email or web address] [facility]  [facility]  Office  [phone]  CC: Primary care physician; Lavera Guise, MD  Note: This dictation was prepared with Dragon dictation along with smaller phrase technology. Any transcriptional errors that result from this process are unintentional.

## 2018-10-28 LAB — CULTURE, BLOOD (ROUTINE X 2)
Culture: NO GROWTH
Special Requests: ADEQUATE

## 2018-11-13 NOTE — Progress Notes (Signed)
PT Cancellation Note  Patient Details Name: [name] MRN: [id number] DOB: Jan 05, 1927   Cancelled Treatment:    Reason Eval/Treat Not Completed: Medical issues which prohibited therapy.  Pt's Ka 5.5 falling outside guidelines for participation with PT services.  Will attempt to see pt at a future date/time as medically appropriate.     Linus Salmons PT, DPT November 05, 2018, 1:33 PM

## 2018-11-13 NOTE — Progress Notes (Signed)
Pt's time of death noted at 96. Pronounced by dr. Manuella Ghazi. Hospice nurse at bedside. [place] [facility] notified. Awaiting a family member before transport to [place].

## 2018-11-13 NOTE — Death Summary Note (Signed)
DEATH SUMMARY   Patient Details  Name: [name] MRN: [id number] DOB: [birth year]  Admission/Discharge Information   Admit Date:  November 07, 2018  Date of Death:  11-13-2018  Time of Death:   10:10 AM  Length of Stay: 6  Referring Physician: Lavera Guise, MD   Reason(s) for Hospitalization  Community Acquired Pneumonia  Diagnoses  Preliminary cause of death: Community Acquired Pneumonia  Secondary Diagnoses (including complications and co-morbidities):  Active Problems:   NSTEMI (non-ST elevated myocardial infarction) ([place])   Acute bronchitis New onset Congestive heart failure Acute renal failure on CKD stage III-IV  Brief Hospital Course (including significant findings, care, treatment, and services provided and events leading to death)  [name] is a 85 y.o. year old female who has a past medical history of  CKD stage III, GERD, hypertension, history of stroke who came in for worsening shortness of breath. Continued to decline medically with hyponatremia worsening, renal function worsening. Was treated for community acquired pneumonia, followed by Nephrology for fluid overload and worsening renal function. On 10/27/18 the decision was made with MD, CM, and family to pursue comfort measures only. Plan was to pursue hospice at home however the patient began to exhibit apneic breathing, minimally responsive. Passed with family members at bedside 2018/11/13.  Hospital course for documentation, interventions were in place prior to comfort measures only 10/27/18:   1. Acute new onset congestive heart failure-fluid restriction, daily weights -Strict input and output monitoring. appreciated cardiology consult. -Echocardiogram with normal EF and diastolic CHF -Lasix on hold due to worsening renal function  2. Community-acquired pneumonia- Negativeblood cultures.  -WBC is elevated. Still on 5 L oxygen though x-ray is improving. -X-ray with pulmonary edemaon admission,may be  underlying pneumonia. - seen by Pulmonology: ordered respiratory treatments, PT/OT daily albumin, changed to vibramycin for better gram + coverage pending sensitivity panel -Was on Rocephin and azithromycin, discontinued -Some reactive airway disease with wheezing. Added steroids. Stopped for elevated WBC.  -Checked procalcitonin level - 0.28 elevated  3. NSTEMI-could be demand ischemia. -Off heparin drip as troponins are plateaued. - Denies any active chest pain now.  -No plans for cardiac cath at this time, would not be tolerant of intervention -Continue aspirin, addedbeta-blocker and statin -Not a candidate for cardiac rehab given overall poor functioning  4. Hyponatremia-secondary to underlying CHF.  -Lasix on hold. Continue to monitor -Nephrology following -trial of Tolvaptan at Dr. Keturah Barre discretion, pended as she is awaiting hospice qualification, comfort care measures only per family  5. Acute renal failure on CKD stage III-IV: Nephrology followed  -Creatinine was 1.5 on admission, worsened up to 2.99 . Lasix has been held 3 days ago. -Gentle hydration to closely monitor her sodium and respiratory status and monitor for fluid overload. Patient appeared fluid overloaded and fluids were discontinued -No prior outpatient follow-up. - Avoid nephrotoxins -Continued to monitor  6. DVT prophylaxis-on subcutaneous heparin while admitted  7. Anemia of chronic disease-hemoglobin at 8.2. Monitored closely. -Iron studies ordered, showing significantly low iron levels. Ferritin is elevated as it is an acute phase reactant. -IV iron for 2 doses received -No active indication for transfusion at this time. Continued to monitor  Physical therapy  She ambulates with a walker at baseline.  Palliative care consulted - we will need home health and palliative care at discharge Home with hospice if she qualifies was the plan, seen by Cory Munch today then  passed.  Family, daughter [name] were updated daily.    Pertinent Labs  and Studies  Significant Diagnostic Studies Dg Chest 2 View  Result Date: 10/25/2018 CLINICAL DATA:  Shortness of breath. EXAM: CHEST - 2 VIEW COMPARISON:  11/07/2018. FINDINGS: Cardiomegaly. Interim improvement of bilateral pulmonary infiltrates suggesting improving pulmonary edema. Bibasilar atelectasis. Bilateral pleural effusions, left side greater than right. No pneumothorax. IMPRESSION: Cardiomegaly. Interim improvement of bilateral pulmonary infiltrates suggesting improving pulmonary edema. Bibasilar atelectasis. Bilateral pleural effusions. Electronically Signed   By: Marcello Moores  Register   On: 10/25/2018 09:23   Dg Chest 2 View  Result Date: 10/19/2018 CLINICAL DATA:  Initial evaluation for acute shortness of breath, hypoxia. EXAM: CHEST - 2 VIEW COMPARISON:  Prior radiograph from 03/17/2018. FINDINGS: Cardiomegaly, similar to previous. Mediastinal silhouette within normal limits. Aortic atherosclerosis. Lungs are hypoinflated. Moderate layering bilateral pleural effusions. Prominent perihilar vascular congestion with bilateral airspace opacities, most likely reflecting moderate diffuse pulmonary edema. Superimposed infection would be difficult to exclude. No pneumothorax. No acute osseous finding. Diffuse spurring noted within the thoracolumbar spine. Prominent degenerative changes noted about the shoulders. IMPRESSION: 1. Cardiomegaly with bilateral pleural effusions and extensive bilateral airspace opacities, likely pulmonary edema. Superimposed infection would be difficult to exclude, and could be considered in the correct clinical setting. 2. Aortic atherosclerosis Electronically Signed   By: Jeannine Boga M.D.   On: 11/12/2018 16:42   US Venous Img Lower Unilateral Left  Result Date: 10/23/2018 CLINICAL DATA:  85 year old female with left lower extremity swelling and shortness of breath EXAM: LEFT LOWER  EXTREMITY VENOUS DOPPLER ULTRASOUND TECHNIQUE: Gray-scale sonography with graded compression, as well as color Doppler and duplex ultrasound were performed to evaluate the lower extremity deep venous systems from the level of the common femoral vein and including the common femoral, femoral, profunda femoral, popliteal and calf veins including the posterior tibial, peroneal and gastrocnemius veins when visible. The superficial great saphenous vein was also interrogated. Spectral Doppler was utilized to evaluate flow at rest and with distal augmentation maneuvers in the common femoral, femoral and popliteal veins. COMPARISON:  None. FINDINGS: Contralateral Common Femoral Vein: Respiratory phasicity is normal and symmetric with the symptomatic side. No evidence of thrombus. Normal compressibility. Common Femoral Vein: No evidence of thrombus. Normal compressibility, respiratory phasicity and response to augmentation. Saphenofemoral Junction: No evidence of thrombus. Normal compressibility and flow on color Doppler imaging. Profunda Femoral Vein: No evidence of thrombus. Normal compressibility and flow on color Doppler imaging. Femoral Vein: No evidence of thrombus. Normal compressibility, respiratory phasicity and response to augmentation. Popliteal Vein: No evidence of thrombus. Normal compressibility, respiratory phasicity and response to augmentation. Calf Veins: No evidence of thrombus. Normal compressibility and flow on color Doppler imaging. Superficial Great Saphenous Vein: No evidence of thrombus. Normal compressibility. Venous Reflux:  None. Other Findings:  None. IMPRESSION: No evidence of deep venous thrombosis. Electronically Signed   By: Jacqulynn Cadet M.D.   On: 10/23/2018 10:07    Microbiology Recent Results (from the past 240 hour(s))  CULTURE, BLOOD (ROUTINE X 2) w Reflex to ID Panel     Status: Abnormal   Collection Time: 10/23/18  1:07 PM  Result Value Ref Range Status   Specimen  Description   Final    BLOOD BLOOD RIGHT WRIST Performed at [facility], [address], [place], [place] [address]    Special Requests   Final    BOTTLES DRAWN AEROBIC AND ANAEROBIC Blood Culture adequate volume Performed at [facility], [address] [place], [place] [address]    Culture  Setup Time  Final    GRAM POSITIVE COCCI ANAEROBIC BOTTLE ONLY CRITICAL RESULT CALLED TO, READ BACK BY AND VERIFIED WITH: C/T Santiago Glad HAYES AT 3893 10/24/18 KLM Performed at [facility], [place] [address] [place], [place] [address]    Culture MICROAEROPHILIC STREPTOCOCCI (A)  Final   Report Status 10/26/2018 FINAL  Final  Blood Culture ID Panel (Reflexed)     Status: Abnormal   Collection Time: 10/23/18  1:07 PM  Result Value Ref Range Status   Enterococcus species NOT DETECTED NOT DETECTED Final   Listeria monocytogenes NOT DETECTED NOT DETECTED Final   Staphylococcus species NOT DETECTED NOT DETECTED Final   Staphylococcus aureus (BCID) NOT DETECTED NOT DETECTED Final   Streptococcus species DETECTED (A) NOT DETECTED Final    Comment: Not Enterococcus species, Streptococcus agalactiae, Streptococcus pyogenes, or Streptococcus pneumoniae. CRITICAL RESULT CALLED TO, READ BACK BY AND VERIFIED WITH: C/T KAREN HAYES AT 7681 10/24/18 KLM    Streptococcus agalactiae NOT DETECTED NOT DETECTED Final   Streptococcus pneumoniae NOT DETECTED NOT DETECTED Final   Streptococcus pyogenes NOT DETECTED NOT DETECTED Final   Acinetobacter baumannii NOT DETECTED NOT DETECTED Final   Enterobacteriaceae species NOT DETECTED NOT DETECTED Final   Enterobacter cloacae complex NOT DETECTED NOT DETECTED Final   Escherichia coli NOT DETECTED NOT DETECTED Final   Klebsiella oxytoca NOT DETECTED NOT DETECTED Final   Klebsiella pneumoniae NOT DETECTED NOT DETECTED Final   Proteus species NOT DETECTED NOT DETECTED Final   Serratia marcescens NOT DETECTED NOT DETECTED Final   Haemophilus  influenzae NOT DETECTED NOT DETECTED Final   Neisseria meningitidis NOT DETECTED NOT DETECTED Final   Pseudomonas aeruginosa NOT DETECTED NOT DETECTED Final   Candida albicans NOT DETECTED NOT DETECTED Final   Candida glabrata NOT DETECTED NOT DETECTED Final   Candida krusei NOT DETECTED NOT DETECTED Final   Candida parapsilosis NOT DETECTED NOT DETECTED Final   Candida tropicalis NOT DETECTED NOT DETECTED Final    Comment: Performed at [facility], [address] [place], [place] [address]  CULTURE, BLOOD (ROUTINE X 2) w Reflex to ID Panel     Status: None   Collection Time: 10/23/18  1:16 PM  Result Value Ref Range Status   Specimen Description BLOOD BLOOD LEFT WRIST  Final   Special Requests   Final    BOTTLES DRAWN AEROBIC AND ANAEROBIC Blood Culture adequate volume   Culture   Final    NO GROWTH 5 DAYS Performed at [facility], [address] [place], [place] [address]    Report Status 2018/11/11 FINAL  Final  Expectorated sputum assessment w rflx to resp cult     Status: None   Collection Time: 10/23/18  9:31 PM  Result Value Ref Range Status   Specimen Description EXPECTORATED SPUTUM  Final   Special Requests Normal  Final   Sputum evaluation   Final    THIS SPECIMEN IS ACCEPTABLE FOR SPUTUM CULTURE Performed at [facility], [address] [place], [place] [address]    Report Status 10/23/2018 FINAL  Final  Culture, respiratory     Status: None   Collection Time: 10/23/18  9:31 PM  Result Value Ref Range Status   Specimen Description   Final    EXPECTORATED SPUTUM Performed at [facility], [address], [place], [place] [address]    Special Requests   Final    Normal Reflexed from [id number] Performed at [facility], [address], [place], [place] [address]  Gram Stain   Final    RARE WBC PRESENT, PREDOMINANTLY PMN RARE SQUAMOUS EPITHELIAL CELLS PRESENT MODERATE GRAM POSITIVE COCCI RARE GRAM POSITIVE  RODS    Culture   Final    MODERATE Consistent with normal respiratory flora. Performed at [facility], [place] [address], [place], [place] [address]    Report Status 10/26/2018 FINAL  Final    Lab Basic Metabolic Panel: Recent Labs  Lab 10/23/18 0321 10/24/18 0510 10/25/18 0516 10/26/18 0539 10/27/18 0426  NA 130* 128* 127* 121* 122*  K 4.5 4.3 4.8 5.5* 5.5*  CL 96* 94* 93* 91* 90*  CO2 22 24 24  18* 19*  GLUCOSE 136* 116* 125* 166* 130*  BUN 36* 48* 64* 74* 82*  CREATININE 1.59* 2.02* 2.42* 2.99* 3.39*  CALCIUM 9.1 8.4* 8.3* 7.9* 7.9*   Liver Function Tests: Recent Labs  Lab 11/07/2018 1710  AST 52*  ALT 42  ALKPHOS 86  BILITOT 0.7  PROT 6.9  ALBUMIN 3.3*   No results for input(s): LIPASE, AMYLASE in the last 168 hours. No results for input(s): AMMONIA in the last 168 hours. CBC: Recent Labs  Lab 10/23/18 0321 10/24/18 0510 10/25/18 0516 10/26/18 0539 10/27/18 0426  WBC 17.0* 14.7* 15.5*  15.7* 12.7* 17.4*  NEUTROABS  --   --  12.5*  --   --   HGB 8.4* 7.4* 8.2*  8.2* 8.2* 7.7*  HCT 24.8* 22.4* 24.8*  24.6* 25.0* 23.3*  MCV 95.4 96.1 98.0  97.2 99.6 97.5  PLT 287 274 314  304 340 344   Cardiac Enzymes: Recent Labs  Lab 11/01/2018 1710 11/01/2018 2131 10/23/18 0321 10/23/18 0912 10/24/18 1143  TROPONINI 0.73* 0.78* 0.93* 1.14* 0.66*   Sepsis Labs: Recent Labs  Lab 10/23/18 1307 10/24/18 0510 10/25/18 0510 10/25/18 0516 10/26/18 0539 10/27/18 0426  PROCALCITON 0.18  --  0.28  --   --   --   WBC  --  14.7*  --  15.5*  15.7* 12.7* 17.4*    Procedures/Operations  None while admitted    [place] Nov 09, 2018, 4:05 PM

## 2018-11-13 NOTE — Progress Notes (Signed)
New referral for hospice services received from [facility]. Writer to the room to meet with patient's family. Hospital PA Joana Lule present as well. Patient's daughters and son present at bedside. Patient seen lying in bed. Some apnea noted during visit. Family reports that patient was restless earlier, patient did receive a dose of PRN haldol this morning. Writer provided education regarding terminal restelssness family concerned about care at home. Writer provided education regarding both home with hospice and hospice home services. Family to discuss options and advise. Shortly after writer left the room  Was contacted by PA Joana to notify of patients death. Follow up visit made to provide emotional support. Chaplain to be paged. Referral updated. Thank you for the opportunity to be involved in the care of this patient and her family. Flo Shanks BSN, RN, [facility]) [phone]

## 2018-11-13 DEATH — deceased

## 2018-12-17 ENCOUNTER — Ambulatory Visit: Payer: Self-pay | Admitting: Nurse Practitioner

## 2019-02-18 ENCOUNTER — Ambulatory Visit (INDEPENDENT_AMBULATORY_CARE_PROVIDER_SITE_OTHER): Payer: Medicare HMO | Admitting: Nurse Practitioner

## 2019-02-18 ENCOUNTER — Encounter (INDEPENDENT_AMBULATORY_CARE_PROVIDER_SITE_OTHER): Payer: Medicare HMO

## 2019-09-02 ENCOUNTER — Ambulatory Visit: Payer: Self-pay | Admitting: Nurse Practitioner

## 2020-05-14 IMAGING — US US CAROTID DUPLEX BILAT
1 series · 13 of 24 positions shown · non-contrast
Comparison: 04/06/2014

CLINICAL DATA: Syncope, hypertension, prior cerebral infarction,
hyperlipidemia and history of right carotid endarterectomy.

EXAM:
BILATERAL CAROTID DUPLEX ULTRASOUND
TECHNIQUE: Gray scale imaging, color Doppler and duplex ultrasound were
performed of bilateral carotid and vertebral arteries in the neck.

[Series 1: us carotid duplex bilat · 13 of 70 slices shown]
[im 1/70]
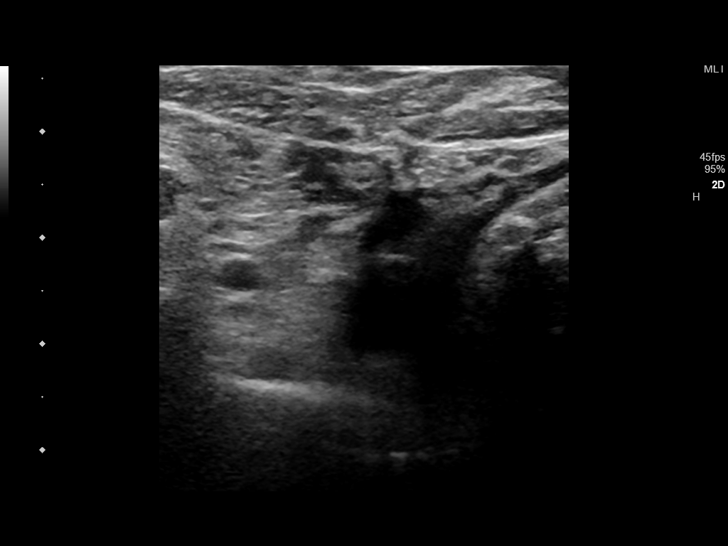
[im 7/70]
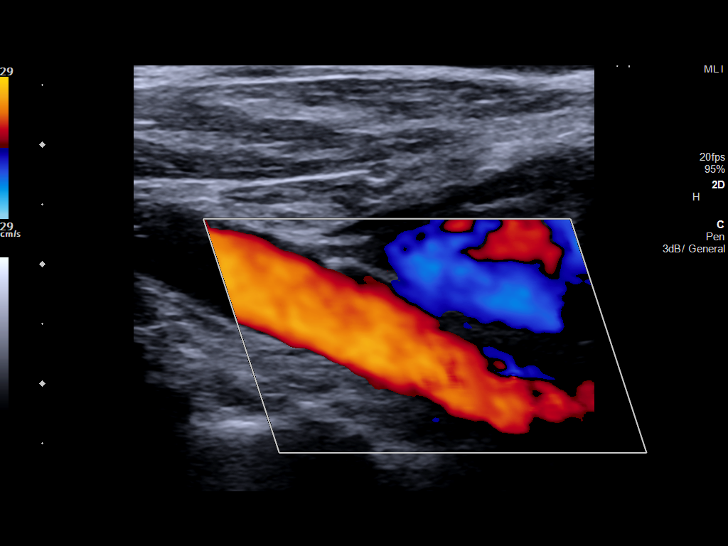
[im 13/70]
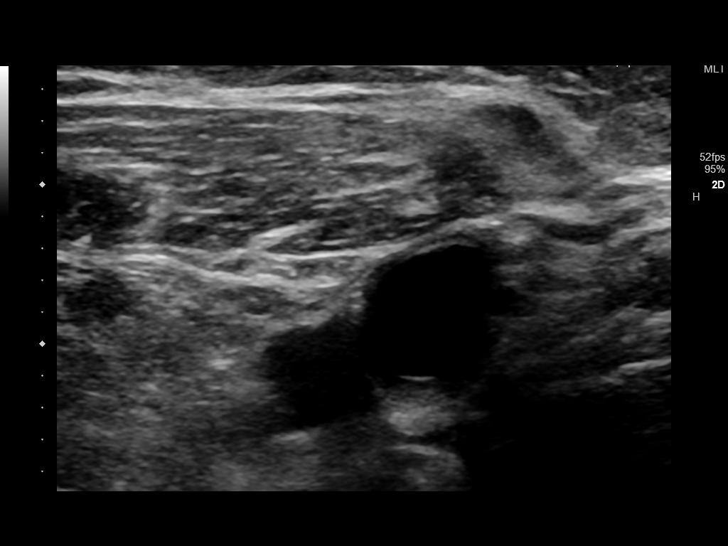
[im 19/70]
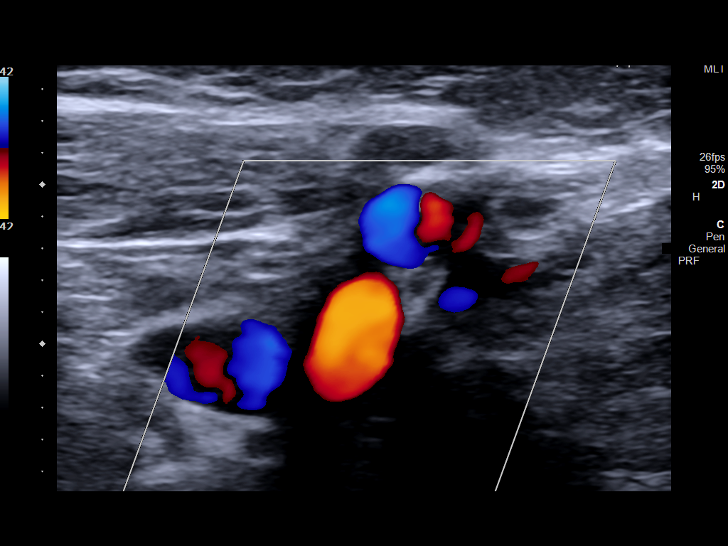
[im 25/70]
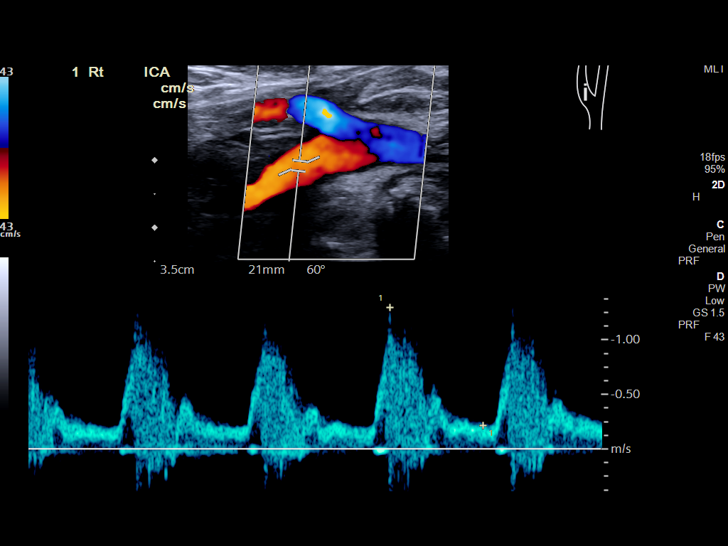
[im 31/70]
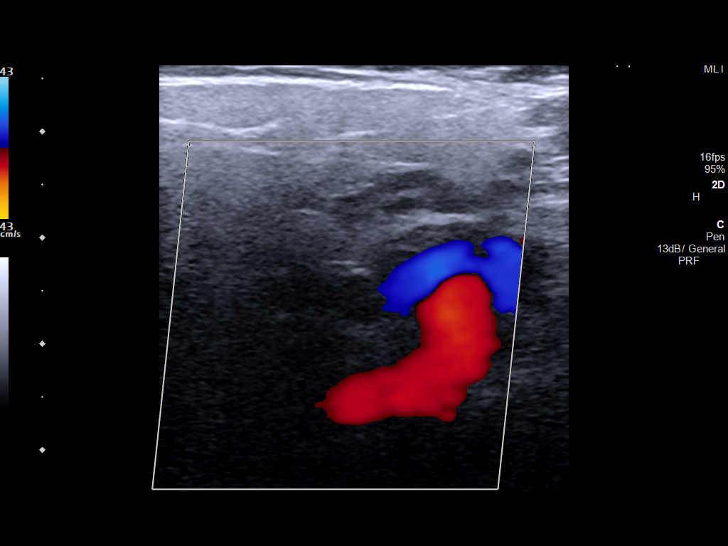
[im 37/70]
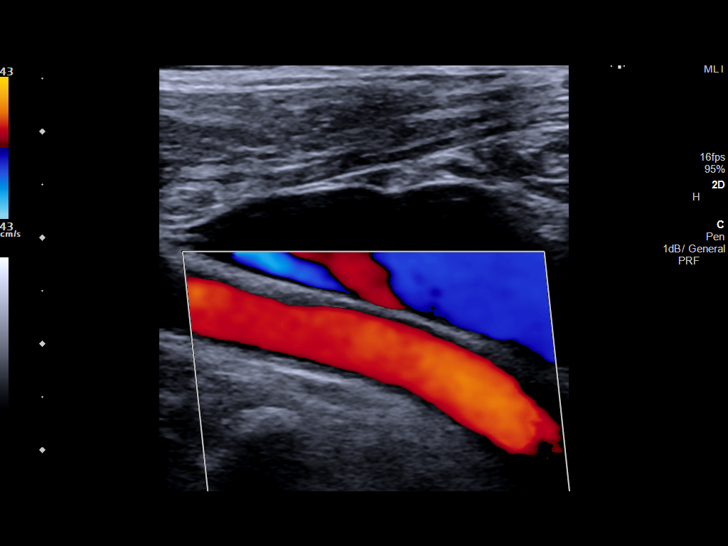
[im 40/70]
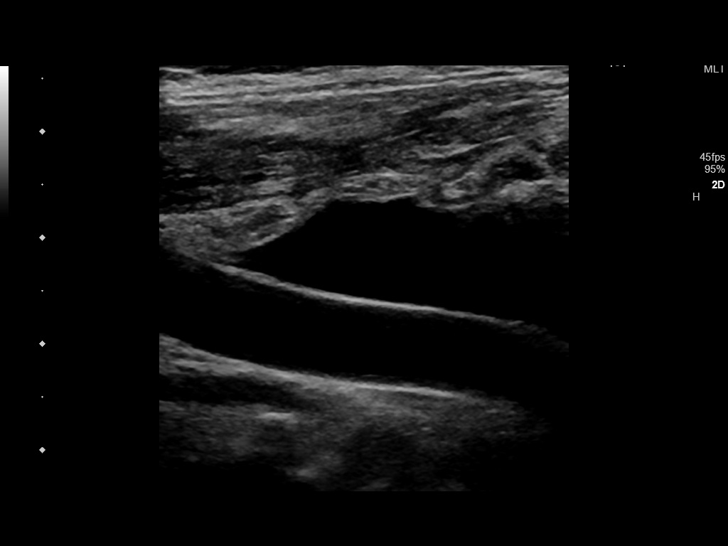
[im 46/70]
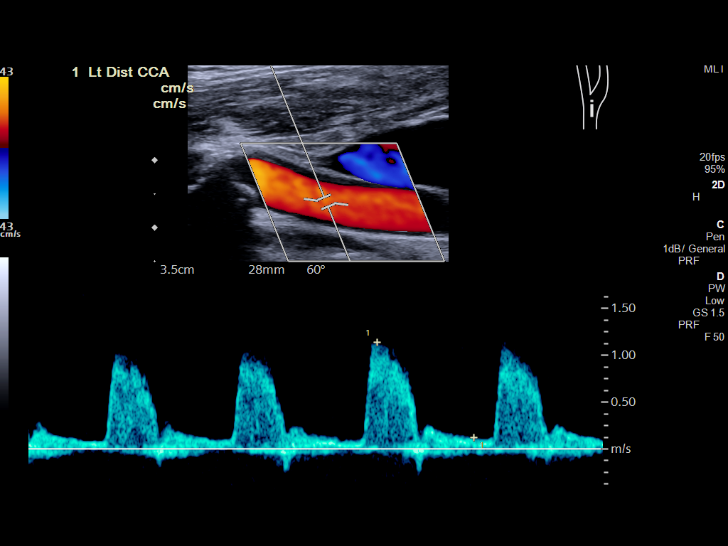
[im 52/70]
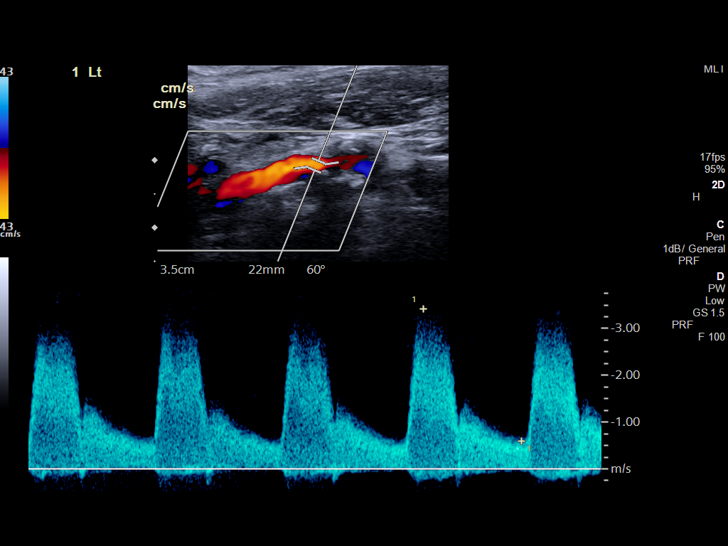
[im 58/70]
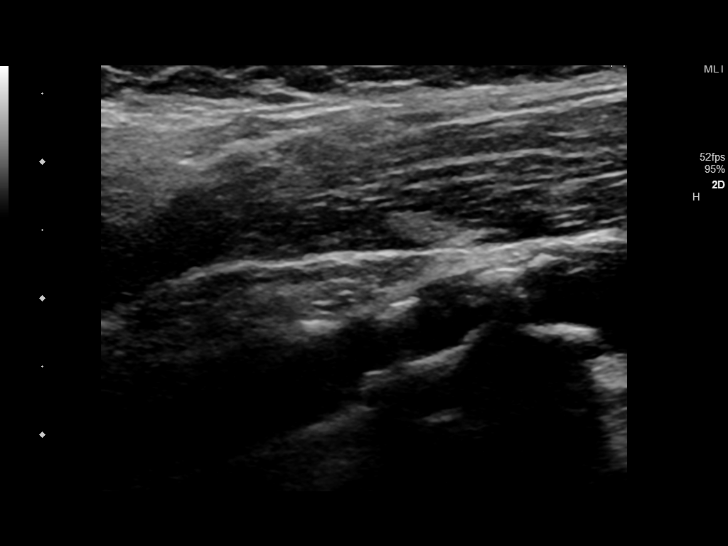
[im 64/70]
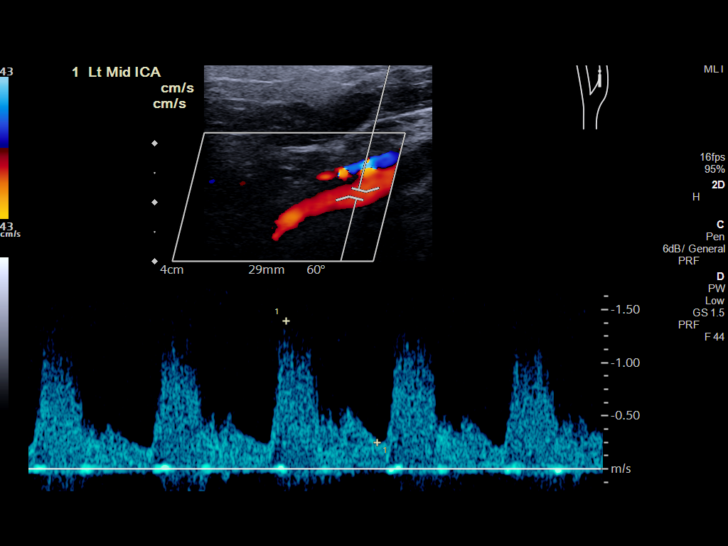
[im 70/70]
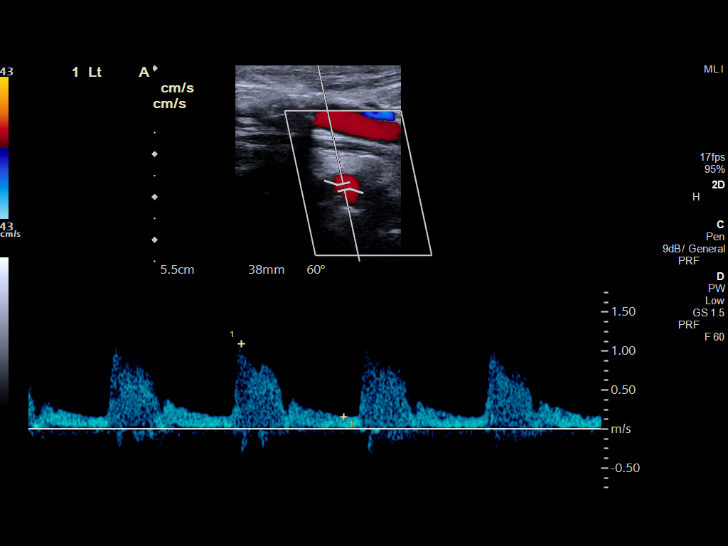

[13 of 24 positions shown; findings below may reference images not displayed]

FINDINGS: Criteria: Quantification of carotid stenosis is based on velocity
parameters that correlate the residual internal carotid diameter
with NASCET-based stenosis levels, using the diameter of the distal
internal carotid lumen as the denominator for stenosis measurement.

The following velocity measurements were obtained:

RIGHT

ICA:  130/22 cm/sec

CCA:  115/12 cm/sec

SYSTOLIC ICA/CCA RATIO:

ECA:  120 cm/sec

LEFT

ICA:  456/74 cm/sec

CCA:  100/12 cm/sec

SYSTOLIC ICA/CCA RATIO:

ECA:  209 cm/sec

RIGHT CAROTID ARTERY: Stable mild intimal thickening at the level of
prior carotid endarterectomy. No evidence of recurrent plaque or
carotid stenosis. No evidence of ICA stenosis. Moderate ICA
tortuosity.

RIGHT VERTEBRAL ARTERY: Antegrade flow with normal waveform and
velocity.

LEFT CAROTID ARTERY: Significant calcified plaque again noted at the
level of the carotid bulb and extending into both internal and
external carotid arteries. Plaque in the proximal left ICA appears
slightly more prominent compared to the 1639 study. Velocities are
also increased with maximal measured velocity of 456 cm/sec compared
to 309 cm/sec on the prior study. Turbulent flow is present.
Findings correspond to a greater than 70% left ICA stenosis which is
likely now approaching critical stenosis.

LEFT VERTEBRAL ARTERY: Antegrade flow with normal waveform and
velocity.
IMPRESSION: 1. More prominent plaque burden by grayscale imaging in the proximal
left ICA with corresponding increase in measured velocity since
1639, as above. Findings are consistent with greater than 70% left
ICA stenosis which is now likely approaching a critical stenosis.
2. No evidence of recurrent right carotid stenosis after prior
endarterectomy.
